# Patient Record
Sex: Female | Born: 1955 | Race: Black or African American | Hispanic: No | Smoking: Never smoker
Health system: Southern US, Community
[De-identification: ages and names within clinical notes are randomized; demographics above are authoritative.]

## PROBLEM LIST (undated history)

## (undated) DIAGNOSIS — K649 Unspecified hemorrhoids: Secondary | ICD-10-CM

## (undated) DIAGNOSIS — H269 Unspecified cataract: Secondary | ICD-10-CM

## (undated) DIAGNOSIS — I10 Essential (primary) hypertension: Secondary | ICD-10-CM

## (undated) DIAGNOSIS — T7840XA Allergy, unspecified, initial encounter: Secondary | ICD-10-CM

## (undated) DIAGNOSIS — H409 Unspecified glaucoma: Secondary | ICD-10-CM

## (undated) DIAGNOSIS — S5290XA Unspecified fracture of unspecified forearm, initial encounter for closed fracture: Secondary | ICD-10-CM

## (undated) DIAGNOSIS — R011 Cardiac murmur, unspecified: Secondary | ICD-10-CM

## (undated) HISTORY — DX: Unspecified glaucoma: H40.9

## (undated) HISTORY — PX: ENDOMETRIAL ABLATION: SHX621

## (undated) HISTORY — DX: Allergy, unspecified, initial encounter: T78.40XA

## (undated) HISTORY — PX: PARTIAL HYSTERECTOMY: SHX80

## (undated) HISTORY — DX: Unspecified cataract: H26.9

---

## 1997-04-15 ENCOUNTER — Ambulatory Visit (HOSPITAL_COMMUNITY): Admission: RE | Admit: 1997-04-15 | Discharge: 1997-04-15 | Payer: Self-pay | Admitting: Obstetrics and Gynecology

## 1997-12-18 ENCOUNTER — Other Ambulatory Visit: Admission: RE | Admit: 1997-12-18 | Discharge: 1997-12-18 | Payer: Self-pay | Admitting: Obstetrics and Gynecology

## 1998-10-20 ENCOUNTER — Other Ambulatory Visit: Admission: RE | Admit: 1998-10-20 | Discharge: 1998-10-20 | Payer: Self-pay | Admitting: Obstetrics and Gynecology

## 1999-01-27 ENCOUNTER — Ambulatory Visit (HOSPITAL_COMMUNITY): Admission: RE | Admit: 1999-01-27 | Discharge: 1999-01-27 | Payer: Self-pay | Admitting: Obstetrics and Gynecology

## 1999-01-27 ENCOUNTER — Encounter: Payer: Self-pay | Admitting: Obstetrics and Gynecology

## 1999-12-18 ENCOUNTER — Other Ambulatory Visit: Admission: RE | Admit: 1999-12-18 | Discharge: 1999-12-18 | Payer: Self-pay | Admitting: Obstetrics and Gynecology

## 2000-01-29 ENCOUNTER — Ambulatory Visit (HOSPITAL_COMMUNITY): Admission: RE | Admit: 2000-01-29 | Discharge: 2000-01-29 | Payer: Self-pay | Admitting: Obstetrics and Gynecology

## 2000-01-29 ENCOUNTER — Encounter: Payer: Self-pay | Admitting: Obstetrics and Gynecology

## 2000-05-11 ENCOUNTER — Encounter: Payer: Self-pay | Admitting: Anesthesiology

## 2000-05-16 ENCOUNTER — Ambulatory Visit (HOSPITAL_COMMUNITY): Admission: RE | Admit: 2000-05-16 | Discharge: 2000-05-16 | Payer: Self-pay | Admitting: Obstetrics and Gynecology

## 2000-06-02 ENCOUNTER — Encounter (INDEPENDENT_AMBULATORY_CARE_PROVIDER_SITE_OTHER): Payer: Self-pay | Admitting: Specialist

## 2000-06-02 HISTORY — PX: ABDOMINAL HYSTERECTOMY: SHX81

## 2000-06-03 ENCOUNTER — Inpatient Hospital Stay (HOSPITAL_COMMUNITY): Admission: RE | Admit: 2000-06-03 | Discharge: 2000-06-05 | Payer: Self-pay | Admitting: Obstetrics and Gynecology

## 2001-11-10 ENCOUNTER — Encounter: Payer: Self-pay | Admitting: Family Medicine

## 2001-11-10 ENCOUNTER — Ambulatory Visit (HOSPITAL_COMMUNITY): Admission: RE | Admit: 2001-11-10 | Discharge: 2001-11-10 | Payer: Self-pay | Admitting: Family Medicine

## 2002-06-22 ENCOUNTER — Encounter: Payer: Self-pay | Admitting: Family Medicine

## 2002-06-22 ENCOUNTER — Encounter: Admission: RE | Admit: 2002-06-22 | Discharge: 2002-06-22 | Payer: Self-pay | Admitting: Family Medicine

## 2002-12-31 ENCOUNTER — Ambulatory Visit (HOSPITAL_COMMUNITY): Admission: RE | Admit: 2002-12-31 | Discharge: 2002-12-31 | Payer: Self-pay | Admitting: Family Medicine

## 2004-01-16 ENCOUNTER — Ambulatory Visit (HOSPITAL_COMMUNITY): Admission: RE | Admit: 2004-01-16 | Discharge: 2004-01-16 | Payer: Self-pay | Admitting: Family Medicine

## 2004-02-03 ENCOUNTER — Ambulatory Visit: Payer: Self-pay | Admitting: Family Medicine

## 2004-02-03 ENCOUNTER — Other Ambulatory Visit: Admission: RE | Admit: 2004-02-03 | Discharge: 2004-02-03 | Payer: Self-pay | Admitting: Family Medicine

## 2004-02-03 LAB — CONVERTED CEMR LAB: Pap Smear: NORMAL

## 2004-02-18 LAB — FECAL OCCULT BLOOD, GUAIAC: Fecal Occult Blood: NEGATIVE

## 2004-02-20 ENCOUNTER — Ambulatory Visit: Payer: Self-pay | Admitting: Family Medicine

## 2004-03-02 ENCOUNTER — Ambulatory Visit: Payer: Self-pay | Admitting: Family Medicine

## 2004-08-04 ENCOUNTER — Ambulatory Visit: Payer: Self-pay | Admitting: Family Medicine

## 2004-09-22 ENCOUNTER — Ambulatory Visit: Payer: Self-pay | Admitting: Family Medicine

## 2005-01-26 ENCOUNTER — Ambulatory Visit (HOSPITAL_COMMUNITY): Admission: RE | Admit: 2005-01-26 | Discharge: 2005-01-26 | Payer: Self-pay | Admitting: Family Medicine

## 2005-03-26 ENCOUNTER — Ambulatory Visit: Payer: Self-pay | Admitting: Family Medicine

## 2005-11-04 ENCOUNTER — Ambulatory Visit: Payer: Self-pay | Admitting: Family Medicine

## 2005-12-23 ENCOUNTER — Ambulatory Visit: Payer: Self-pay | Admitting: Family Medicine

## 2006-01-03 ENCOUNTER — Ambulatory Visit: Payer: Self-pay | Admitting: Family Medicine

## 2006-01-05 ENCOUNTER — Ambulatory Visit: Payer: Self-pay | Admitting: Gastroenterology

## 2006-01-27 ENCOUNTER — Ambulatory Visit (HOSPITAL_COMMUNITY): Admission: RE | Admit: 2006-01-27 | Discharge: 2006-01-27 | Payer: Self-pay | Admitting: Family Medicine

## 2006-02-25 ENCOUNTER — Ambulatory Visit: Payer: Self-pay | Admitting: Gastroenterology

## 2006-03-05 HISTORY — PX: COLONOSCOPY: SHX174

## 2006-03-05 LAB — HM COLONOSCOPY: HM Colonoscopy: NORMAL

## 2006-03-11 ENCOUNTER — Ambulatory Visit: Payer: Self-pay | Admitting: Gastroenterology

## 2006-04-06 ENCOUNTER — Ambulatory Visit: Payer: Self-pay | Admitting: Family Medicine

## 2006-05-16 ENCOUNTER — Telehealth (INDEPENDENT_AMBULATORY_CARE_PROVIDER_SITE_OTHER): Payer: Self-pay | Admitting: Internal Medicine

## 2006-05-16 ENCOUNTER — Ambulatory Visit: Payer: Self-pay | Admitting: Family Medicine

## 2006-07-06 ENCOUNTER — Encounter: Payer: Self-pay | Admitting: Family Medicine

## 2006-07-06 DIAGNOSIS — B009 Herpesviral infection, unspecified: Secondary | ICD-10-CM | POA: Insufficient documentation

## 2006-07-06 DIAGNOSIS — N3946 Mixed incontinence: Secondary | ICD-10-CM | POA: Insufficient documentation

## 2006-07-06 DIAGNOSIS — I10 Essential (primary) hypertension: Secondary | ICD-10-CM | POA: Insufficient documentation

## 2006-07-19 ENCOUNTER — Ambulatory Visit: Payer: Self-pay | Admitting: Family Medicine

## 2006-07-19 DIAGNOSIS — M21619 Bunion of unspecified foot: Secondary | ICD-10-CM | POA: Insufficient documentation

## 2006-07-21 ENCOUNTER — Encounter: Payer: Self-pay | Admitting: Family Medicine

## 2006-08-18 ENCOUNTER — Ambulatory Visit: Payer: Self-pay | Admitting: Family Medicine

## 2006-08-18 DIAGNOSIS — J342 Deviated nasal septum: Secondary | ICD-10-CM | POA: Insufficient documentation

## 2006-09-08 ENCOUNTER — Telehealth (INDEPENDENT_AMBULATORY_CARE_PROVIDER_SITE_OTHER): Payer: Self-pay | Admitting: *Deleted

## 2006-10-03 ENCOUNTER — Telehealth: Payer: Self-pay | Admitting: Family Medicine

## 2006-11-15 ENCOUNTER — Ambulatory Visit: Payer: Self-pay | Admitting: Family Medicine

## 2006-11-15 DIAGNOSIS — R7303 Prediabetes: Secondary | ICD-10-CM | POA: Insufficient documentation

## 2006-12-15 ENCOUNTER — Ambulatory Visit: Payer: Self-pay | Admitting: Family Medicine

## 2006-12-16 LAB — CONVERTED CEMR LAB
ALT: 11 units/L (ref 0–35)
AST: 18 units/L (ref 0–37)
Albumin: 3.8 g/dL (ref 3.5–5.2)
BUN: 11 mg/dL (ref 6–23)
CO2: 29 meq/L (ref 19–32)
Calcium: 8.9 mg/dL (ref 8.4–10.5)
Chloride: 101 meq/L (ref 96–112)
Cholesterol: 189 mg/dL (ref 0–200)
Creatinine, Ser: 0.8 mg/dL (ref 0.4–1.2)
GFR calc Af Amer: 96 mL/min
GFR calc non Af Amer: 80 mL/min
Glucose, Bld: 101 mg/dL — ABNORMAL HIGH (ref 70–99)
HDL: 60.4 mg/dL (ref >39.0)
Hgb A1c MFr Bld: 6.1 % — ABNORMAL HIGH (ref 4.6–6.0)
LDL Cholesterol: 117 mg/dL — ABNORMAL HIGH (ref 0–99)
Phosphorus: 3.9 mg/dL (ref 2.3–4.6)
Potassium: 3.7 meq/L (ref 3.5–5.1)
Sodium: 137 meq/L (ref 135–145)
Total CHOL/HDL Ratio: 3.1
Triglycerides: 60 mg/dL (ref 0–149)
VLDL: 12 mg/dL (ref 0–40)

## 2007-01-20 ENCOUNTER — Ambulatory Visit: Payer: Self-pay | Admitting: Family Medicine

## 2007-02-07 ENCOUNTER — Ambulatory Visit: Payer: Self-pay | Admitting: Family Medicine

## 2007-03-02 ENCOUNTER — Encounter: Admission: RE | Admit: 2007-03-02 | Discharge: 2007-03-02 | Payer: Self-pay | Admitting: Family Medicine

## 2007-03-08 ENCOUNTER — Encounter (INDEPENDENT_AMBULATORY_CARE_PROVIDER_SITE_OTHER): Payer: Self-pay | Admitting: *Deleted

## 2007-04-21 ENCOUNTER — Ambulatory Visit: Payer: Self-pay | Admitting: Family Medicine

## 2007-04-25 LAB — CONVERTED CEMR LAB
Albumin: 3.9 g/dL (ref 3.5–5.2)
BUN: 16 mg/dL (ref 6–23)
CO2: 32 meq/L (ref 19–32)
Calcium: 9.4 mg/dL (ref 8.4–10.5)
Chloride: 100 meq/L (ref 96–112)
Creatinine, Ser: 0.9 mg/dL (ref 0.4–1.2)
GFR calc Af Amer: 84 mL/min
GFR calc non Af Amer: 70 mL/min
Glucose, Bld: 96 mg/dL (ref 70–99)
Hgb A1c MFr Bld: 6.3 % — ABNORMAL HIGH (ref 4.6–6.0)
Phosphorus: 4.2 mg/dL (ref 2.3–4.6)
Potassium: 3.4 meq/L — ABNORMAL LOW (ref 3.5–5.1)
Sodium: 137 meq/L (ref 135–145)

## 2007-04-28 ENCOUNTER — Ambulatory Visit: Payer: Self-pay | Admitting: Family Medicine

## 2007-04-28 DIAGNOSIS — E876 Hypokalemia: Secondary | ICD-10-CM | POA: Insufficient documentation

## 2007-05-12 ENCOUNTER — Ambulatory Visit: Payer: Self-pay | Admitting: Family Medicine

## 2007-05-15 LAB — CONVERTED CEMR LAB
Albumin: 3.9 g/dL (ref 3.5–5.2)
BUN: 14 mg/dL (ref 6–23)
CO2: 31 meq/L (ref 19–32)
Calcium: 9.3 mg/dL (ref 8.4–10.5)
Chloride: 101 meq/L (ref 96–112)
Creatinine, Ser: 0.8 mg/dL (ref 0.4–1.2)
GFR calc Af Amer: 96 mL/min
GFR calc non Af Amer: 79 mL/min
Glucose, Bld: 100 mg/dL — ABNORMAL HIGH (ref 70–99)
Phosphorus: 4.1 mg/dL (ref 2.3–4.6)
Potassium: 3.9 meq/L (ref 3.5–5.1)
Sodium: 138 meq/L (ref 135–145)

## 2007-06-19 ENCOUNTER — Telehealth: Payer: Self-pay | Admitting: Family Medicine

## 2007-06-19 ENCOUNTER — Ambulatory Visit: Payer: Self-pay | Admitting: Family Medicine

## 2007-06-19 DIAGNOSIS — R4589 Other symptoms and signs involving emotional state: Secondary | ICD-10-CM | POA: Insufficient documentation

## 2007-06-21 LAB — CONVERTED CEMR LAB
Albumin: 4 g/dL (ref 3.5–5.2)
BUN: 15 mg/dL (ref 6–23)
Basophils Absolute: 0.1 10*3/uL (ref 0.0–0.1)
Basophils Relative: 1.3 % — ABNORMAL HIGH (ref 0.0–1.0)
CO2: 30 meq/L (ref 19–32)
Calcium: 9.2 mg/dL (ref 8.4–10.5)
Chloride: 102 meq/L (ref 96–112)
Creatinine, Ser: 0.9 mg/dL (ref 0.4–1.2)
Eosinophils Absolute: 0.1 10*3/uL (ref 0.0–0.7)
Eosinophils Relative: 2.3 % (ref 0.0–5.0)
GFR calc Af Amer: 84 mL/min
GFR calc non Af Amer: 69 mL/min
Glucose, Bld: 105 mg/dL — ABNORMAL HIGH (ref 70–99)
HCT: 36.5 % (ref 36.0–46.0)
Hemoglobin: 12.2 g/dL (ref 12.0–15.0)
Lymphocytes Relative: 38.6 % (ref 12.0–46.0)
MCHC: 33.5 g/dL (ref 30.0–36.0)
MCV: 97.9 fL (ref 78.0–100.0)
Monocytes Absolute: 0.4 10*3/uL (ref 0.1–1.0)
Monocytes Relative: 8.5 % (ref 3.0–12.0)
Neutro Abs: 2.2 10*3/uL (ref 1.4–7.7)
Neutrophils Relative %: 49.3 % (ref 43.0–77.0)
Phosphorus: 4.3 mg/dL (ref 2.3–4.6)
Platelets: 310 10*3/uL (ref 150–400)
Potassium: 4.1 meq/L (ref 3.5–5.1)
RBC: 3.73 M/uL — ABNORMAL LOW (ref 3.87–5.11)
RDW: 11.8 % (ref 11.5–14.6)
Sed Rate: 11 mm/hr (ref 0–22)
Sodium: 139 meq/L (ref 135–145)
TSH: 1.82 microintl units/mL (ref 0.35–5.50)
WBC: 4.6 10*3/uL (ref 4.5–10.5)

## 2007-07-20 ENCOUNTER — Ambulatory Visit: Payer: Self-pay | Admitting: Family Medicine

## 2007-11-28 ENCOUNTER — Telehealth (INDEPENDENT_AMBULATORY_CARE_PROVIDER_SITE_OTHER): Payer: Self-pay | Admitting: *Deleted

## 2008-02-02 ENCOUNTER — Ambulatory Visit: Payer: Self-pay | Admitting: Family Medicine

## 2008-03-04 ENCOUNTER — Ambulatory Visit: Payer: Self-pay | Admitting: Family Medicine

## 2008-03-05 ENCOUNTER — Encounter (INDEPENDENT_AMBULATORY_CARE_PROVIDER_SITE_OTHER): Payer: Self-pay | Admitting: *Deleted

## 2008-03-05 LAB — CONVERTED CEMR LAB
ALT: 26 units/L (ref 0–35)
AST: 24 units/L (ref 0–37)
Albumin: 4.2 g/dL (ref 3.5–5.2)
Alkaline Phosphatase: 51 units/L (ref 39–117)
BUN: 16 mg/dL (ref 6–23)
Basophils Absolute: 0 10*3/uL (ref 0.0–0.1)
Basophils Relative: 0.1 % (ref 0.0–3.0)
Bilirubin, Direct: 0.1 mg/dL (ref 0.0–0.3)
CO2: 31 meq/L (ref 19–32)
Calcium: 9.5 mg/dL (ref 8.4–10.5)
Chloride: 102 meq/L (ref 96–112)
Cholesterol: 200 mg/dL (ref 0–200)
Creatinine, Ser: 0.8 mg/dL (ref 0.4–1.2)
Eosinophils Absolute: 0 10*3/uL (ref 0.0–0.7)
Eosinophils Relative: 1.2 % (ref 0.0–5.0)
GFR calc Af Amer: 96 mL/min
GFR calc non Af Amer: 79 mL/min
Glucose, Bld: 110 mg/dL — ABNORMAL HIGH (ref 70–99)
HCT: 38.7 % (ref 36.0–46.0)
HDL: 70 mg/dL (ref >39.0)
Hemoglobin: 13.5 g/dL (ref 12.0–15.0)
LDL Cholesterol: 124 mg/dL — ABNORMAL HIGH (ref 0–99)
Lymphocytes Relative: 41.2 % (ref 12.0–46.0)
MCHC: 35 g/dL (ref 30.0–36.0)
MCV: 93.4 fL (ref 78.0–100.0)
Monocytes Absolute: 0.3 10*3/uL (ref 0.1–1.0)
Monocytes Relative: 7.9 % (ref 3.0–12.0)
Neutro Abs: 1.9 10*3/uL (ref 1.4–7.7)
Neutrophils Relative %: 49.6 % (ref 43.0–77.0)
Phosphorus: 3.9 mg/dL (ref 2.3–4.6)
Platelets: 264 10*3/uL (ref 150–400)
Potassium: 3.4 meq/L — ABNORMAL LOW (ref 3.5–5.1)
RBC: 4.14 M/uL (ref 3.87–5.11)
RDW: 12 % (ref 11.5–14.6)
Sodium: 140 meq/L (ref 135–145)
Total Bilirubin: 0.8 mg/dL (ref 0.3–1.2)
Total CHOL/HDL Ratio: 2.9
Total Protein: 7.3 g/dL (ref 6.0–8.3)
Triglycerides: 30 mg/dL (ref 0–149)
VLDL: 6 mg/dL (ref 0–40)
WBC: 3.7 10*3/uL — ABNORMAL LOW (ref 4.5–10.5)

## 2008-03-06 ENCOUNTER — Ambulatory Visit: Payer: Self-pay | Admitting: Family Medicine

## 2008-03-06 ENCOUNTER — Telehealth: Payer: Self-pay | Admitting: Family Medicine

## 2008-03-06 LAB — CONVERTED CEMR LAB
Bacteria, UA: 0
Bilirubin Urine: NEGATIVE
Blood in Urine, dipstick: NEGATIVE
Glucose, Urine, Semiquant: NEGATIVE
Ketones, urine, test strip: NEGATIVE
Nitrite: NEGATIVE
RBC / HPF: 0
Specific Gravity, Urine: 1.02
Urobilinogen, UA: 0.2
WBC Urine, dipstick: NEGATIVE
pH: 6.5

## 2008-03-07 ENCOUNTER — Encounter (INDEPENDENT_AMBULATORY_CARE_PROVIDER_SITE_OTHER): Payer: Self-pay | Admitting: Internal Medicine

## 2008-03-18 ENCOUNTER — Ambulatory Visit: Payer: Self-pay | Admitting: Family Medicine

## 2008-03-18 DIAGNOSIS — D72829 Elevated white blood cell count, unspecified: Secondary | ICD-10-CM | POA: Insufficient documentation

## 2008-03-20 LAB — CONVERTED CEMR LAB
Basophils Absolute: 0 10*3/uL (ref 0.0–0.1)
Basophils Relative: 0 % (ref 0–1)
Eosinophils Absolute: 0.1 10*3/uL (ref 0.0–0.7)
Eosinophils Relative: 1 % (ref 0–5)
HCT: 40.4 % (ref 36.0–46.0)
Hemoglobin: 13.8 g/dL (ref 12.0–15.0)
Lymphocytes Relative: 43 % (ref 12–46)
Lymphs Abs: 2 10*3/uL (ref 0.7–4.0)
MCHC: 34.2 g/dL (ref 30.0–36.0)
MCV: 90.2 fL (ref 78.0–100.0)
Monocytes Absolute: 0.5 10*3/uL (ref 0.1–1.0)
Monocytes Relative: 10 % (ref 3–12)
Neutro Abs: 2.1 10*3/uL (ref 1.7–7.7)
Neutrophils Relative %: 46 % (ref 43–77)
Platelets: 291 10*3/uL (ref 150–400)
Potassium: 3.9 meq/L (ref 3.5–5.3)
RBC: 4.48 M/uL (ref 3.87–5.11)
RDW: 12.2 % (ref 11.5–15.5)
WBC: 4.6 10*3/uL (ref 4.0–10.5)

## 2008-04-02 ENCOUNTER — Ambulatory Visit (HOSPITAL_COMMUNITY): Admission: RE | Admit: 2008-04-02 | Discharge: 2008-04-02 | Payer: Self-pay | Admitting: Family Medicine

## 2008-04-04 ENCOUNTER — Encounter (INDEPENDENT_AMBULATORY_CARE_PROVIDER_SITE_OTHER): Payer: Self-pay | Admitting: *Deleted

## 2008-06-19 ENCOUNTER — Ambulatory Visit: Payer: Self-pay | Admitting: Family Medicine

## 2008-11-05 ENCOUNTER — Encounter: Payer: Self-pay | Admitting: Family Medicine

## 2008-12-30 ENCOUNTER — Ambulatory Visit: Payer: Self-pay | Admitting: Otolaryngology

## 2009-01-07 ENCOUNTER — Ambulatory Visit: Payer: Self-pay | Admitting: Family Medicine

## 2009-01-21 ENCOUNTER — Ambulatory Visit: Payer: Self-pay | Admitting: Family Medicine

## 2009-01-23 ENCOUNTER — Telehealth: Payer: Self-pay | Admitting: Family Medicine

## 2009-01-30 ENCOUNTER — Ambulatory Visit: Payer: Self-pay | Admitting: Family Medicine

## 2009-02-05 ENCOUNTER — Telehealth: Payer: Self-pay | Admitting: Family Medicine

## 2009-02-07 ENCOUNTER — Ambulatory Visit: Payer: Self-pay | Admitting: Family Medicine

## 2009-02-10 LAB — CONVERTED CEMR LAB
Albumin: 4 g/dL (ref 3.5–5.2)
BUN: 13 mg/dL (ref 6–23)
CO2: 31 meq/L (ref 19–32)
Calcium: 9.2 mg/dL (ref 8.4–10.5)
Chloride: 103 meq/L (ref 96–112)
Creatinine, Ser: 0.8 mg/dL (ref 0.4–1.2)
GFR calc non Af Amer: 95.5 mL/min (ref >60)
Glucose, Bld: 74 mg/dL (ref 70–99)
Phosphorus: 3.2 mg/dL (ref 2.3–4.6)
Potassium: 3.8 meq/L (ref 3.5–5.1)
Sodium: 140 meq/L (ref 135–145)

## 2009-03-07 ENCOUNTER — Ambulatory Visit: Payer: Self-pay | Admitting: Family Medicine

## 2009-03-10 ENCOUNTER — Ambulatory Visit: Payer: Self-pay | Admitting: Family Medicine

## 2009-03-10 ENCOUNTER — Telehealth: Payer: Self-pay | Admitting: Family Medicine

## 2009-03-10 LAB — CONVERTED CEMR LAB
Bilirubin Urine: NEGATIVE
Glucose, Urine, Semiquant: NEGATIVE
Ketones, urine, test strip: NEGATIVE
Nitrite: NEGATIVE
Protein, U semiquant: 30
Specific Gravity, Urine: 1.01
Urobilinogen, UA: 0.2
pH: 6

## 2009-04-03 ENCOUNTER — Ambulatory Visit (HOSPITAL_COMMUNITY): Admission: RE | Admit: 2009-04-03 | Discharge: 2009-04-03 | Payer: Self-pay | Admitting: Family Medicine

## 2009-04-03 LAB — HM MAMMOGRAPHY: HM Mammogram: NEGATIVE

## 2009-04-08 ENCOUNTER — Encounter (INDEPENDENT_AMBULATORY_CARE_PROVIDER_SITE_OTHER): Payer: Self-pay | Admitting: *Deleted

## 2009-08-11 ENCOUNTER — Ambulatory Visit: Payer: Self-pay | Admitting: Family Medicine

## 2009-08-12 LAB — CONVERTED CEMR LAB
ALT: 27 units/L (ref 0–35)
AST: 24 units/L (ref 0–37)
Albumin: 4.2 g/dL (ref 3.5–5.2)
Alkaline Phosphatase: 49 units/L (ref 39–117)
BUN: 18 mg/dL (ref 6–23)
Basophils Absolute: 0 10*3/uL (ref 0.0–0.1)
Basophils Relative: 0.5 % (ref 0.0–3.0)
Bilirubin, Direct: 0.1 mg/dL (ref 0.0–0.3)
CO2: 32 meq/L (ref 19–32)
Calcium: 9.3 mg/dL (ref 8.4–10.5)
Chloride: 101 meq/L (ref 96–112)
Cholesterol: 203 mg/dL — ABNORMAL HIGH (ref 0–200)
Creatinine, Ser: 0.7 mg/dL (ref 0.4–1.2)
Direct LDL: 107.3 mg/dL
Eosinophils Absolute: 0.1 10*3/uL (ref 0.0–0.7)
Eosinophils Relative: 2.1 % (ref 0.0–5.0)
GFR calc non Af Amer: 104.3 mL/min (ref >60)
Glucose, Bld: 94 mg/dL (ref 70–99)
HCT: 37.3 % (ref 36.0–46.0)
HDL: 78.3 mg/dL (ref >39.00)
Hemoglobin: 12.7 g/dL (ref 12.0–15.0)
Lymphocytes Relative: 34.7 % (ref 12.0–46.0)
Lymphs Abs: 1.4 10*3/uL (ref 0.7–4.0)
MCHC: 34.1 g/dL (ref 30.0–36.0)
MCV: 94.6 fL (ref 78.0–100.0)
Monocytes Absolute: 0.4 10*3/uL (ref 0.1–1.0)
Monocytes Relative: 10.6 % (ref 3.0–12.0)
Neutro Abs: 2.1 10*3/uL (ref 1.4–7.7)
Neutrophils Relative %: 52.1 % (ref 43.0–77.0)
Platelets: 259 10*3/uL (ref 150.0–400.0)
Potassium: 3.9 meq/L (ref 3.5–5.1)
RBC: 3.94 M/uL (ref 3.87–5.11)
RDW: 13.5 % (ref 11.5–14.6)
Sodium: 142 meq/L (ref 135–145)
TSH: 1.23 microintl units/mL (ref 0.35–5.50)
Total Bilirubin: 0.5 mg/dL (ref 0.3–1.2)
Total CHOL/HDL Ratio: 3
Total Protein: 6.9 g/dL (ref 6.0–8.3)
Triglycerides: 100 mg/dL (ref 0.0–149.0)
VLDL: 20 mg/dL (ref 0.0–40.0)
WBC: 4.1 10*3/uL — ABNORMAL LOW (ref 4.5–10.5)

## 2009-08-13 ENCOUNTER — Ambulatory Visit: Payer: Self-pay | Admitting: Family Medicine

## 2009-08-13 DIAGNOSIS — J309 Allergic rhinitis, unspecified: Secondary | ICD-10-CM | POA: Insufficient documentation

## 2009-11-04 ENCOUNTER — Ambulatory Visit: Payer: Self-pay | Admitting: Family Medicine

## 2009-11-04 DIAGNOSIS — K644 Residual hemorrhoidal skin tags: Secondary | ICD-10-CM | POA: Insufficient documentation

## 2010-01-25 ENCOUNTER — Encounter: Payer: Self-pay | Admitting: Family Medicine

## 2010-02-03 NOTE — Progress Notes (Signed)
Summary: BP med is working  Phone Note Call from Patient Call back at [phone]   Caller: Patient Call For: Judith Part MD Action Taken: Patient advised to go to ER Summary of Call: Patient was scheduled for an app tomorrow to check her BP. She called and cancelled it, she said that she had her school nurse check it on monday and it was 120/80 and the dentist checked it this morning and it was 127/72. She wanted you to know that the med seems to be working.  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 Additional Follow-up by: Lowella Petties CMA,  February 05, 2009 9:52 AM

## 2010-02-03 NOTE — Letter (Signed)
Summary: Results Follow up Letter  [facility]  [address] [place], [place] [address]   Phone: [phone]  Fax: [phone]    04/08/2009 MRN: [id number]    [facility] [address] [place], [place]  [address]    Dear Ms. LEVANDOSKI,  The following are the results of your recent test(s):  Test         Result    Pap Smear:        Normal _____  Not Normal _____ Comments: ______________________________________________________ Cholesterol: LDL(Bad cholesterol):         Your goal is less than:         HDL (Good cholesterol):       Your goal is more than: Comments:  ______________________________________________________ Mammogram:        Normal __X___  Not Normal _____ Comments:  Yearly follow up is recommended.   ___________________________________________________________________ Hemoccult:        Normal _____  Not normal _______ Comments:    _____________________________________________________________________ Other Tests:    We routinely do not discuss normal results over the telephone.  If you desire a copy of the results, or you have any questions about this information we can discuss them at your next office visit.   Sincerely,    Marne A. Milinda Antis, M.D.  MAT:lsf

## 2010-02-03 NOTE — Assessment & Plan Note (Signed)
Summary: 11:30 CHECK BP/CLE   Vital Signs:  Patient profile:   55 year old female Weight:      205 pounds Temp:     97.8 degrees F oral Pulse rate:   60 / minute Pulse rhythm:   regular BP sitting:   152 / 92  (left arm) Cuff size:   regular  Vitals Entered By: Lowella Petties CMA (January 30, 2009 11:48 AM)  Serial Vital Signs/Assessments:  Time      Position  BP       Pulse  Resp  Temp     By                     148/92                         Judith Part MD  CC: BP follow up- spironolactone is causing diarrhea   History of Present Illness: here for re check of bp - on increased altace and new spironolactone   did not take the spironolactone due to diarrhea (also did not make her urinate)   HCTz- went back to that -- a whole one today -- is urinating a lot more did take altace today   no nausea or fever or abd pain   pressure yesterday- still 160/90 , this am still 160s   in past lotrel helped her bp most- but she stopped it due to hair loss in retrospect - she does not know if that actually caused it  she wants to try it again   Allergies: 1)  ! * Benicar 2)  ! Spironolactone 3)  Norvasc 4)  * Uniretic 5)  * Hctz 6)  Diovan  Past History:  Past Medical History: Last updated: 06/19/2007 Hypertension hyperglycemia deviated septum- symptomatic  bunion foot deformity stress reaction/anxiety HSV   Past Surgical History: Last updated: 07/06/2006 Hysterectomy- partial (06/2000) Endometrial ablation Colonosxopy (01/2000) Dexa- normal (12/2005) LS film- mod OA changes (12/2005), Deg changes LS-5 Colonoscopy- neg (03/2006)  Family History: Last updated: 01/07/2009 Father: HTN, CAD with CABG Mother: HTN Siblings:  aunts- heart problems   Social History: Last updated: 06/19/2007 Marital Status: single (divorced ) Children: 2 Occupation: Engineer, site, also going to school for teaching degree  non smoker no alcohol regular exercise   Risk  Factors: Smoking Status: never (05/16/2006)  Review of Systems General:  Denies fatigue. Eyes:  Denies blurring and eye irritation. CV:  Complains of swelling of feet; denies chest pain or discomfort, lightheadness, palpitations, shortness of breath with exertion, and swelling of hands. Resp:  Denies cough and wheezing. GI:  Denies abdominal pain, change in bowel habits, and nausea. GU:  Denies urinary frequency. MS:  Denies joint pain. Derm:  Denies lesion(s), poor wound healing, and rash. Neuro:  Denies numbness and tingling. Endo:  Denies cold intolerance, excessive thirst, excessive urination, and heat intolerance.  Physical Exam  General:  overweight but generally well appearing  Head:  normocephalic, atraumatic, and no abnormalities observed.   Eyes:  vision grossly intact, pupils equal, pupils round, and pupils reactive to light.  no conjunctival pallor, injection or icterus  Mouth:  pharynx pink and moist.   Neck:  supple with full rom and no masses or thyromegally, no JVD or carotid bruit  Lungs:  Normal respiratory effort, chest expands symmetrically. Lungs are clear to auscultation, no crackles or wheezes. Heart:  Normal rate and regular rhythm. S1 and S2 normal without  gallop, murmur, click, rub or other extra sounds. Pulses:  R and L carotid,radial,femoral,dorsalis pedis and posterior tibial pulses are full and equal bilaterally Extremities:  No clubbing, cyanosis, edema, or deformity noted with normal full range of motion of all joints.   Neurologic:  sensation intact to light touch, gait normal, and DTRs symmetrical and normal.   Skin:  Intact without suspicious lesions or rashes Cervical Nodes:  No lymphadenopathy noted Psych:  normal affect, talkative and pleasant    Impression & Recommendations:  Problem # 1:  HYPERTENSION (ICD-401.9) Assessment Unchanged  still not opt controlled  pt intol of spironolactone and not eff she wants to re - try lotrel that  worked the best - ? if caused hair loss, she is uncertain will get back on that and add hctz at 50 f/u 1 wk for lab and visit update in meantime for problems or side eff  rev imp of eating low sodium diet and exercising The following medications were removed from the medication list:    Altace 10 Mg Caps (Ramipril) .Marland Kitchen... 2 by mouth once daily    Spironolactone 25 Mg Tabs (Spironolactone) .Marland Kitchen... Take one by mouth daily Her updated medication list for this problem includes:    Lotrel 5-20 Mg Caps (Amlodipine besy-benazepril hcl) .Marland Kitchen... 1 by mouth each am    Hydrochlorothiazide 50 Mg Tabs (Hydrochlorothiazide) .Marland Kitchen... 1 by mouth once daily  BP today: 152/92 Prior BP: 150/84 (01/21/2009)  Labs Reviewed: K+: 3.9 (03/18/2008) Creat: : 0.8 (03/04/2008)   Chol: 200 (03/04/2008)   HDL: 70.0 (03/04/2008)   LDL: 124 (03/04/2008)   TG: 30 (03/04/2008)  Complete Medication List: 1)  Fish Oil 600 Mg  .... Take two by mouth daily 2)  Multiple Vitamins Tabs (Multiple vitamin) .... Take by mouth as directed 3)  Potassium Chloride Cr 10 Meq Cr-tabs (Potassium chloride) .Marland Kitchen.. 1 by mouth once daily 4)  Lotrel 5-20 Mg Caps (Amlodipine besy-benazepril hcl) .Marland Kitchen.. 1 by mouth each am 5)  Hydrochlorothiazide 50 Mg Tabs (Hydrochlorothiazide) .Marland Kitchen.. 1 by mouth once daily  Other Orders: Prescription Created Electronically [phone])  Patient Instructions: 1)  continue 1 whole hctz daily 2)  stop altace 3)  start on lotrel 5/20 again (if side effcts or hair loss let me know )  4)  follow up as planned 5)  keep working on healthy diet and exercise-- avoid salt as much as possible  Prescriptions: LOTREL 5-20 MG CAPS (AMLODIPINE BESY-BENAZEPRIL HCL) 1 by mouth each am  #30 x 5   Entered and Authorized by:   Judith Part MD   Signed by:   Judith Part MD on 01/30/2009   Method used:   Electronically to        [facility]. [address] [phone]* (retail)       [address] [place], [place]  [id number]        Ph: [phone]       Fax: [phone]   RxID:   [id number]

## 2010-02-03 NOTE — Assessment & Plan Note (Signed)
Summary: Rebecca [name] bp check/rbh  Nurse Visit   Vital Signs:  Patient profile:   55 year old female Weight:      203 pounds Pulse rate:   60 / minute BP sitting:   150 / 84  (right arm) Cuff size:   large  Vitals Entered By: Lowella Petties CMA (January 21, 2009 4:19 PM) CC: Nurse visit- BP check.   Allergies: 1)  ! * Benicar 2)  Norvasc 3)  * Uniretic 4)  * Hctz 5)  Diovan 6)  * Lotrel  Orders Added: 1)  Est. Patient Level I [84[address]]   please adv pt that blood pressure is down a bit but still too high I want to add another bp med (would like to replace her hct with a different diuretic she has not tried before called spironolactone)  please stop hctz start spironolactone 25 mg once daily #30 5 ref - and update if any side effects -- and please change on EMR f/u in about 2 weeks for visit and labs   Advised pt, med called to [facility] [address] follow up appt made.           Lowella Petties CMA  January 22, 2009 4:50 PM  Prior Medications: FISH OIL 600 MG () take two by mouth daily MULTIPLE VITAMINS   TABS (MULTIPLE VITAMIN) take by mouth as directed ALTACE 10 MG CAPS (RAMIPRIL) 2 by mouth once daily POTASSIUM CHLORIDE CR 10 MEQ CR-TABS (POTASSIUM CHLORIDE) 1 by mouth once daily Current Allergies: ! * BENICAR NORVASC * UNIRETIC * HCTZ DIOVAN * LOTREL

## 2010-02-03 NOTE — Assessment & Plan Note (Signed)
Summary: needs EKG before surgery   Vital Signs:  Patient profile:   55 year old female Weight:      203 pounds BMI:     31.91 Temp:     97.8 degrees F oral Pulse rate:   76 / minute Pulse rhythm:   regular BP sitting:   164 / 90  (left arm) Cuff size:   large  Vitals Entered By: Lowella Petties CMA (January 07, 2009 12:07 PM) CC: EKG- clearance for surgery   History of Present Illness: will be re- scheduling her --for nasal surgery for deviated septum and to clean out sinuses   had EKG with some pvcs at their office (sent me a copy but unreadable) had some PVC s on it  todays EKG - is normal without PVCs  did have coffee the day of her prev EKG   never had any heart problems- but it does run in family  did labs -- no results yet   no problems with general anesthesia in past  no lung problems or heart problems  no change in medicine   bp was up a bit today 164/90   she does occ note palpitations with caffiene   Allergies: 1)  ! * Benicar 2)  Norvasc 3)  * Uniretic 4)  * Hctz 5)  Diovan 6)  * Lotrel  Past History:  Past Medical History: Last updated: 06/19/2007 Hypertension hyperglycemia deviated septum- symptomatic  bunion foot deformity stress reaction/anxiety HSV   Past Surgical History: Last updated: 07/06/2006 Hysterectomy- partial (06/2000) Endometrial ablation Colonosxopy (01/2000) Dexa- normal (12/2005) LS film- mod OA changes (12/2005), Deg changes LS-5 Colonoscopy- neg (03/2006)  Family History: Last updated: 01/07/2009 Father: HTN, CAD with CABG Mother: HTN Siblings:  aunts- heart problems   Social History: Last updated: 06/19/2007 Marital Status: single (divorced ) Children: 2 Occupation: Engineer, site, 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 Eyes:  Denies blurring and double vision. CV:  Complains of palpitations; denies chest pain or discomfort, shortness of breath with exertion, and swelling of feet; occ palpitation. Resp:  Denies cough, shortness of breath, and wheezing. GI:  Denies abdominal pain, change in bowel habits, and nausea. MS:  Complains of joint pain; denies muscle weakness. Derm:  Denies itching, lesion(s), poor wound healing, and rash. Neuro:  Denies numbness and tingling. Heme:  Denies abnormal bruising and bleeding.  Physical Exam  General:  overweight but generally well appearing  Head:  normocephalic, atraumatic, and no abnormalities observed.   Eyes:  vision grossly intact, pupils equal, pupils round, and pupils reactive to light.  no conjunctival pallor, injection or icterus  Mouth:  pharynx pink and moist.   Neck:  supple with full rom and no masses or thyromegally, no JVD or carotid bruit  Chest Wall:  No deformities, masses, or tenderness noted. Lungs:  Normal respiratory effort, chest expands symmetrically. Lungs are clear to auscultation, no crackles or wheezes. Heart:  Normal rate and regular rhythm. S1 and S2 normal without gallop, murmur, click, rub or other extra sounds. Abdomen:  Bowel sounds positive,abdomen soft and non-tender without masses, organomegaly or hernias noted. no renal bruits  Msk:  No deformity or scoliosis noted of thoracic or lumbar spine.  Pulses:  R and L carotid,radial,femoral,dorsalis pedis and posterior tibial pulses are full and equal bilaterally Extremities:  No clubbing, cyanosis, edema, or deformity noted with normal full range of motion of all joints.   Neurologic:  sensation intact to light touch, gait normal, and DTRs symmetrical and normal.   Skin:  Intact without suspicious lesions or rashes Cervical Nodes:  No lymphadenopathy noted Psych:  normal affect, talkative  and pleasant    Impression & Recommendations:  Problem # 1:  HYPERTENSION (ICD-401.9) Assessment Deteriorated  bp is up-- will inc her altace to 2 pills (20 mg) -- update if side eff bp check in 2 weeks  labs will be due in spring urged to get back to exercise  Her updated medication list for this problem includes:    Hydrochlorothiazide 50 Mg Tabs (Hydrochlorothiazide) .Marland Kitchen... Take 1/2 by mouth once daily    Altace 10 Mg Caps (Ramipril) .Marland Kitchen... 2 by mouth once daily  BP today: 164/90-- re check 152/90 Prior BP: 120/80 (06/19/2008)  Labs Reviewed: K+: 3.9 (03/18/2008) Creat: : 0.8 (03/04/2008)   Chol: 200 (03/04/2008)   HDL: 70.0 (03/04/2008)   LDL: 124 (03/04/2008)   TG: 30 (03/04/2008)  Orders: EKG w/ Interpretation ([address])  Problem # 2:  PRE-OPERATIVE CARDIOVASCULAR EXAMINATION (ICD-V72.81)  normal EKG today with no acute changes (? if had pvc on last )  pt does have known palpitations with caffiene- asked her to quit that  no restrictions for surgery   Orders: EKG w/ Interpretation ([address])  Complete Medication List: 1)  Hydrochlorothiazide 50 Mg Tabs (Hydrochlorothiazide) .... Take 1/2 by mouth once daily 2)  Fish Oil 600 Mg  .... Take two by mouth daily 3)  Multiple Vitamins Tabs (Multiple vitamin) .... Take by mouth as directed 4)  Altace 10 Mg Caps (Ramipril) .... 2 by mouth once daily 5)  Potassium Chloride Cr 10 Meq Cr-tabs (Potassium chloride) .Marland Kitchen.. 1 by mouth once daily  Patient Instructions: 1)  avoid caffiene  2)  drink more water 3)  get back to regular exercise  4)  increase ramipril 10 mg to 2 pills daily - update me if any side effects or problems with this  5)  no restrictions for surgery  6)  follow up for nurse visit in about 2 weeks for blood pressure check on increased medicine dose  7)  schedule follow up for 30 min visit for gyn exam when able please  Prescriptions: ALTACE 10 MG CAPS (RAMIPRIL) 2 by mouth once daily  #60 x 5   Entered and  Authorized by:   Judith Part MD   Signed by:   Judith Part MD on 01/07/2009   Method used:   Print then Give to Patient   RxID:   [id number]   Prior Medications (reviewed today): HYDROCHLOROTHIAZIDE 50 MG  TABS (HYDROCHLOROTHIAZIDE) take 1/2 by mouth once daily FISH OIL 600 MG () take two by mouth daily MULTIPLE VITAMINS   TABS (MULTIPLE VITAMIN) take by mouth as directed ALTACE 10 MG CAPS (RAMIPRIL) 2 by mouth once daily POTASSIUM CHLORIDE CR 10 MEQ CR-TABS (POTASSIUM CHLORIDE) 1 by mouth once daily Current Allergies: ! * BENICAR NORVASC * UNIRETIC * HCTZ DIOVAN * LOTREL  EKG  Procedure date:  01/07/2009  Findings:      NSR with rate of 68 no acute changes

## 2010-02-03 NOTE — Assessment & Plan Note (Signed)
Summary: gyn exam/rbh   Vital Signs:  Patient profile:   55 year old female Height:      67 inches Weight:      204.25 pounds BMI:     32.11 Temp:     98.4 degrees F oral Pulse rate:   64 / minute Pulse rhythm:   regular BP sitting:   124 / 70  (left arm) Cuff size:   large  Vitals Entered By: Lewanda Rife LPN (March 07, 1608 2:25 PM)  Serial Vital Signs/Assessments:  Time      Position  BP       Pulse  Resp  Temp     By                     120/70                         Judith Part MD   History of Present Illness: has been doing well - bp in very good control   started back with her exercise goes to the gym at least 30 min per day -- cardio and weights she really enjoys   is not as good with diet as she should be  fried foods and sweets   wants to loose to under 200 lb   no headaches or flushing  edema is minimal  overall is happy about her bp  Allergies: 1)  ! * Benicar 2)  ! Spironolactone 3)  Norvasc 4)  * Uniretic 5)  * Hctz 6)  Diovan  Past History:  Past Medical History: Last updated: 06/19/2007 Hypertension hyperglycemia deviated septum- symptomatic  bunion foot deformity stress reaction/anxiety HSV   Past Surgical History: Last updated: 07/06/2006 Hysterectomy- partial (06/2000) Endometrial ablation Colonosxopy (01/2000) Dexa- normal (12/2005) LS film- mod OA changes (12/2005), Deg changes LS-5 Colonoscopy- neg (03/2006)  Family History: Last updated: 01/07/2009 Father: HTN, CAD with CABG Mother: HTN Siblings:  aunts- heart problems   Social History: Last updated: 06/19/2007 Marital Status: single (divorced ) Children: 2 Occupation: Engineer, site, 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 Eyes:  Denies blurring and discharge. CV:  Denies chest  pain or discomfort, lightheadness, palpitations, and shortness of breath with exertion. Resp:  Denies cough, shortness of breath, and wheezing. GI:  Denies abdominal pain and indigestion. MS:  Denies muscle aches. Derm:  Denies poor wound healing and rash. Neuro:  Denies headaches and tingling. Endo:  Denies cold intolerance, excessive thirst, excessive urination, and heat intolerance. Heme:  Denies abnormal bruising and bleeding.   Impression & Recommendations:  Problem # 1:  HYPERTENSION (ICD-401.9) Assessment Improved  great control now on current meds rev renal labs disc healthy diet (low simple sugar/ choose complex carbs/ low sat fat) diet and exercise in detail  rev low sodium diet enc to keep up the good exercise and wt loss effort  plan labs and f/u in aug for check up Her updated medication list for this problem includes:    Lotrel 5-20 Mg Caps (Amlodipine besy-benazepril hcl) .Marland Kitchen... 1 by mouth each am    Hydrochlorothiazide 50 Mg Tabs (Hydrochlorothiazide) .Marland Kitchen... 1 by mouth once daily  Orders: Prescription Created Electronically [id number])  Complete Medication List: 1)  Fish Oil 600 Mg  .... Take two by mouth  daily 2)  Multiple Vitamins Tabs (Multiple vitamin) .... Take by mouth as directed 3)  Potassium Chloride Cr 10 Meq Cr-tabs (Potassium chloride) .Marland Kitchen.. 1 by mouth once daily 4)  Lotrel 5-20 Mg Caps (Amlodipine besy-benazepril hcl) .Marland Kitchen.. 1 by mouth each am 5)  Hydrochlorothiazide 50 Mg Tabs (Hydrochlorothiazide) .Marland Kitchen.. 1 by mouth once daily  Patient Instructions: 1)  I sent all your px to pharmacy to file  2)  no change in medicines  3)  keep up the good exercise 4)  start working on diet and good water intake  5)  schedule fasting labs and then follow up in august wellness profile/ lipids v70.0 , 401.1 (for PE) Prescriptions: HYDROCHLOROTHIAZIDE 50 MG TABS (HYDROCHLOROTHIAZIDE) 1 by mouth once daily  #30 x 11   Entered and Authorized by:   Judith Part MD   Signed  by:   Judith Part MD on 03/07/2009   Method used:   Electronically to        [facility]. [address] [phone]* (retail)       [address] [place], [place]  [address]       Ph: [phone]       Fax: [phone]   RxID:   [id number] LOTREL 5-20 MG CAPS (AMLODIPINE BESY-BENAZEPRIL HCL) 1 by mouth each am  #30 x 11   Entered and Authorized by:   Judith Part MD   Signed by:   Judith Part MD on 03/07/2009   Method used:   Electronically to        [facility]. [address] [phone]* (retail)       [address] [place], [place]  [address]       Ph: [phone]       Fax: [phone]   RxID:   [id number] POTASSIUM CHLORIDE CR 10 MEQ CR-TABS (POTASSIUM CHLORIDE) 1 by mouth once daily  #30 x 11   Entered and Authorized by:   Judith Part MD   Signed by:   Judith Part MD on 03/07/2009   Method used:   Electronically to        [facility]. [address] [phone]* (retail)       [address] [place], [place]  [id number]       Ph: [phone]       Fax: [phone]   RxID:   [id number]   Current Allergies (reviewed today): ! * BENICAR ! SPIRONOLACTONE NORVASC * UNIRETIC * HCTZ DIOVAN

## 2010-02-03 NOTE — Progress Notes (Signed)
Summary: regarding BP  Phone Note Call from Patient   Caller: Patient Call For: Judith Part MD Summary of Call: Pt had her BP checked at work and it is 160/98, and she says her left arm felt a little achy earlier.  She didnt take her altace this morning, she thought she was supposed to stop that along with hctz.  I advised her she needs to continue to take altace along with spironolactone.  She will have the school nurse check her BP a couple of more times today. Initial call taken by: Lowella Petties CMA,  January 23, 2009 12:10 PM  Follow-up for Phone Call        thank you - do not stop the altace  let me know if bp does not come back down Follow-up by: Judith Part MD,  January 23, 2009 1:17 PM

## 2010-02-03 NOTE — Assessment & Plan Note (Signed)
Summary: CPX W/PAP ????/RBH   Vital Signs:  Patient profile:   55 year old female Height:      67 inches Weight:      200 pounds BMI:     31.44 Temp:     97.9 degrees F oral Pulse rate:   64 / minute Pulse rhythm:   regular BP sitting:   106 / 70  (left arm) Cuff size:   large  Vitals Entered By: Lewanda Rife LPN (August 13, 2009 10:41 AM) CC: CPX LMP partial hyst 2006, Hypertension Management   History of Present Illness: here for wellness exam and to review chronic health problems   has been feeling pretty good   head and ear are hurting- allergy pill helps (generic claritin?) R ear hurts  no colored d/c  no fever   lipids are good with LDL 107 and HDL 78 good sugar in the 90s  down some weight and working on it  is now running on the treadmill- enjoys that   pap was 06-- and part hyst in past no symptoms at all , no problems   mam 3/11 self exam- no lumps or problems   nl colonosc 08  other labs good   Td 04  wt down 4 lb with bmi of 31  HTN well controlled with 106/70 today  nl dexa in 07 ca and d -- is taking that   Hypertension History:      Positive major cardiovascular risk factors include female age 69 years old or older and hypertension.  Negative major cardiovascular risk factors include non-tobacco-user status.     Allergies: 1)  ! * Benicar 2)  ! Spironolactone 3)  Norvasc 4)  * Uniretic 5)  * Hctz 6)  Diovan  Past History:  Past Medical History: Last updated: 06/19/2007 Hypertension hyperglycemia deviated septum- symptomatic  bunion foot deformity stress reaction/anxiety HSV   Past Surgical History: Last updated: 07/06/2006 Hysterectomy- partial (06/2000) Endometrial ablation Colonosxopy (01/2000) Dexa- normal (12/2005) LS film- mod OA changes (12/2005), Deg changes LS-5 Colonoscopy- neg (03/2006)  Family History: Last updated: 01/07/2009 Father: HTN, CAD with CABG Mother: HTN Siblings:  aunts- heart problems    Social History: Last updated: 06/19/2007 Marital Status: single (divorced ) Children: 2 Occupation: Engineer, site, 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 Eyes:  Denies blurring and eye irritation. ENT:  Complains of earache, nasal congestion, and postnasal drainage; denies sinus pressure and sore throat. CV:  Denies chest pain or discomfort and palpitations. Resp:  Denies cough, shortness of breath, sputum productive, and wheezing. GI:  Denies abdominal pain, change in bowel habits, indigestion, and nausea. GU:  Denies abnormal vaginal bleeding, discharge, dysuria, and urinary frequency. MS:  Denies joint pain, joint redness, and joint swelling. Derm:  Denies itching, lesion(s), poor wound healing, and rash. Neuro:  Denies numbness and tingling. Psych:  mood is fairly good lately. Endo:  Denies excessive thirst and excessive urination. Heme:  Denies abnormal bruising and bleeding.  Physical Exam  General:  overweight but generally well appearing  Head:  normocephalic, atraumatic, and no abnormalities observed.  no sinus tenderness Eyes:  vision grossly intact, pupils equal, pupils round, and pupils reactive to light.  no conjunctival pallor, injection or icterus  Ears:  L ear normal.  R TM slt retracted - no erythema  Nose:  dev septum mild congestion noted bilat  Mouth:  pharynx pink and moist, no erythema, and no exudates.   Neck:  supple with full rom and no masses or thyromegally, no JVD or carotid bruit  Chest Wall:  No deformities, masses, or tenderness noted. Breasts:  No mass, nodules, thickening, tenderness, bulging, retraction, inflamation, nipple discharge or skin changes noted.   Lungs:  Normal respiratory effort, chest expands symmetrically. Lungs are clear to auscultation, no crackles  or wheezes. Heart:  Normal rate and regular rhythm. S1 and S2 normal without gallop, murmur, click, rub or other extra sounds. Abdomen:  Bowel sounds positive,abdomen soft and non-tender without masses, organomegaly or hernias noted. no renal bruits  Msk:  No deformity or scoliosis noted of thoracic or lumbar spine.   Pulses:  R and L carotid,radial,femoral,dorsalis pedis and posterior tibial pulses are full and equal bilaterally Extremities:  No clubbing, cyanosis, edema, or deformity noted with normal full range of motion of all joints.   Neurologic:  sensation intact to light touch, gait normal, and DTRs symmetrical and normal.   Skin:  Intact without suspicious lesions or rashes Cervical Nodes:  No lymphadenopathy noted Axillary Nodes:  No palpable lymphadenopathy Inguinal Nodes:  No significant adenopathy Psych:  normal affect, talkative and pleasant    Impression & Recommendations:  Problem # 1:  HEALTH MAINTENANCE EXAM (ICD-V70.0) Assessment Comment Only reviewed health habits including diet, exercise and skin cancer prevention reviewed health maintenance list and family history enc to keep up the great habits   Problem # 2:  HYPOKALEMIA (ICD-276.8) Assessment: Unchanged good K level on current supplement - no change   Problem # 3:  HYPERTENSION (ICD-401.9) Assessment: Improved  bp is very good on current doses new px for new pharmacy given keep up diet and exercise  Her updated medication list for this problem includes:    Lotrel 5-20 Mg Caps (Amlodipine besy-benazepril hcl) .Marland Kitchen... 1 by mouth each am    Hydrochlorothiazide 50 Mg Tabs (Hydrochlorothiazide) .Marland Kitchen... 1 by mouth once daily  BP today: 106/70 Prior BP: 124/70 (03/07/2009)  10 Yr Risk Heart Disease: 3 %  Labs Reviewed: K+: 3.9 (08/11/2009) Creat: : 0.7 (08/11/2009)   Chol: 203 (08/11/2009)   HDL: 78.30 (08/11/2009)   LDL: 124 (03/04/2008)   TG: 100.0 (08/11/2009)  Problem # 4:  ALLERGIC RHINITIS  (ICD-477.9) Assessment: New with sinus and ear c/o intermittently suspect ragweed allergy this season  adv continued use of claritin otc - and update if fever/ purulent d/c or worse pain -- signs of sinus infection  Complete Medication List: 1)  Fish Oil 600 Mg  .... Take two by mouth daily 2)  Multiple Vitamins Tabs (Multiple vitamin) .... Take by mouth as directed 3)  Potassium Chloride Cr 10 Meq Cr-tabs (Potassium chloride) .Marland Kitchen.. 1 by mouth once daily 4)  Lotrel 5-20 Mg Caps (Amlodipine besy-benazepril hcl) .Marland Kitchen.. 1 by mouth each am 5)  Hydrochlorothiazide 50 Mg Tabs (Hydrochlorothiazide) .Marland Kitchen.. 1 by mouth once daily  Hypertension Assessment/Plan:      The patient's hypertensive risk group is category B: At least one risk factor (excluding diabetes) with no target organ damage.  Her calculated 10 year risk of coronary heart disease is 3 %.  Today's blood pressure is 106/70.    Patient Instructions: 1)  here are your px to take to a new pharmacy  2)  keep up the great diet and exercise 3)  labs look good  4)  the current recommendation for  calcium intake is 1200-1500 mg daily with [phone] IU of vitamin D  5)  take claritin for nasal and ear symptoms 6)  if fever/ more pain or colored nasal discharge- let me know  Prescriptions: HYDROCHLOROTHIAZIDE 50 MG TABS (HYDROCHLOROTHIAZIDE) 1 by mouth once daily  #30 x 11   Entered and Authorized by:   Judith Part MD   Signed by:   Judith Part MD on 08/13/2009   Method used:   Print then Give to Patient   RxID:   [id number] LOTREL 5-20 MG CAPS (AMLODIPINE BESY-BENAZEPRIL HCL) 1 by mouth each am  #30 x 11   Entered and Authorized by:   Judith Part MD   Signed by:   Judith Part MD on 08/13/2009   Method used:   Print then Give to Patient   RxID:   [id number] POTASSIUM CHLORIDE CR 10 MEQ CR-TABS (POTASSIUM CHLORIDE) 1 by mouth once daily  #30 x 11   Entered and Authorized by:   Judith Part MD   Signed by:   Judith Part MD on 08/13/2009   Method used:   Print then Give to Patient   RxID:   [id number]   Current Allergies (reviewed today): ! [facility] ! SPIRONOLACTONE NORVASC * UNIRETIC * HCTZ DIOVAN

## 2010-02-03 NOTE — Letter (Signed)
Summary: [facility] Clearance for Surgery Form  [facility] Clearance for Surgery Form   Imported By: Beau Fanny 01/13/2009 15:38:57  _____________________________________________________________________  External Attachment:    Type:   Image     Comment:   External Document

## 2010-02-03 NOTE — Assessment & Plan Note (Signed)
Summary: ?HEMORRHOIDS/CLE   Vital Signs:  Patient profile:   55 year old female Height:      67 inches Weight:      199.75 pounds BMI:     31.40 Temp:     98.1 degrees F oral Pulse rate:   72 / minute Pulse rhythm:   regular BP sitting:   136 / 84  (left arm) Cuff size:   large  Vitals Entered By: Lewanda Rife LPN (November 04, 2009 3:02 PM) CC: ? hemorrhoids, Abdominal Pain   History of Present Illness: thinks she is having hemorroid problems  there is a lump she can feel  does not bother her at all  using Tuks pads  no bleeding at all  no constipation or straining or abd pain not lifting weights  has had hemorroids in the past - thinks external    neg  colonoscopy in 3/08   Allergies: 1)  ! * Benicar 2)  ! Spironolactone 3)  Norvasc 4)  * Uniretic 5)  * Hctz 6)  Diovan  Past History:  Past Medical History: Last updated: 06/19/2007 Hypertension hyperglycemia deviated septum- symptomatic  bunion foot deformity stress reaction/anxiety HSV   Past Surgical History: Last updated: 07/06/2006 Hysterectomy- partial (06/2000) Endometrial ablation Colonosxopy (01/2000) Dexa- normal (12/2005) LS film- mod OA changes (12/2005), Deg changes LS-5 Colonoscopy- neg (03/2006)  Family History: Last updated: 01/07/2009 Father: HTN, CAD with CABG Mother: HTN Siblings:  aunts- heart problems   Social History: Last updated: 06/19/2007 Marital Status: single (divorced ) Children: 2 Occupation: Engineer, site, also going to school for teaching degree  non smoker no alcohol regular exercise   Risk Factors: Smoking Status: never (05/16/2006)  Review of Systems General:  Denies chills, fatigue, fever, loss of appetite, and malaise. Eyes:  Denies blurring and eye irritation. CV:  Denies chest pain or discomfort and palpitations. Resp:  Denies cough, shortness of breath, and wheezing. GI:  Denies abdominal pain, bloody stools, change in bowel habits,  indigestion, nausea, and vomiting. Derm:  Denies itching and rash. Heme:  Denies abnormal bruising and bleeding.  Physical Exam  General:  overweight but generally well appearing  Head:  normocephalic, atraumatic, and no abnormalities observed.   Heart:  Normal rate and regular rhythm. S1 and S2 normal without gallop, murmur, click, rub or other extra sounds. Abdomen:  Bowel sounds positive,abdomen soft and non-tender without masses, organomegaly or hernias noted. Rectal:  external hemorroid 4 mm at 7:00- soft and nt nl rectal tone heme neg stool Skin:  Intact without suspicious lesions or rashes Inguinal Nodes:  No significant adenopathy Psych:  normal affect, talkative and pleasant    Impression & Recommendations:  Problem # 1:  HEMORRHOIDS, EXTERNAL (ICD-455.3) Assessment New non thrombosed and not painful disc strategies for control and tx handout given from [facility] to read  update if inc size or any pain or bleeding disc avoiding constipation  Complete Medication List: 1)  Fish Oil 600 Mg  .... Take two by mouth daily 2)  Multiple Vitamins Tabs (Multiple vitamin) .... Take by mouth as directed 3)  Potassium Chloride Cr 10 Meq Cr-tabs (Potassium chloride) .Marland Kitchen.. 1 by mouth once daily 4)  Lotrel 5-20 Mg Caps (Amlodipine besy-benazepril hcl) .Marland Kitchen.. 1 by mouth each am 5)  Hydrochlorothiazide 50 Mg Tabs (Hydrochlorothiazide) .Marland Kitchen.. 1 by mouth once daily   Patient Instructions: 1)  I think you have a small external hemorroid that is not inflammed 2)  try to avoid constipation and straining  3)  tuks pads and prep H are fine to use  4)  let me know if it becomes painful or bigger 5)  here is a handout to read    Orders Added: 1)  Est. Patient Level III [16109]    Current Allergies (reviewed today): ! * BENICAR ! SPIRONOLACTONE NORVASC * UNIRETIC * HCTZ DIOVAN

## 2010-02-03 NOTE — Progress Notes (Signed)
Summary: ? UTI  Phone Note Call from Patient Call back at [phone]   Summary of Call: Pt is complaining of burning with urination, dark color to urine since this morning.  Can she come in and leave a sample since there are no appts available today? Initial call taken by: Lowella Petties CMA,  March 10, 2009 11:53 AM  Follow-up for Phone Call        come in and leave ua -- add on to this phone note and send back to me  Follow-up by: Judith Part MD,  March 10, 2009 11:56 AM  Additional Follow-up for Phone Call Additional follow up Details #1::        Pt will come this afternoon between 4 and 4:30pm.Pt uses [facility] on [address] for pharmacy if needed. Lewanda Rife LPN  March 11, 4538 1:07 PM   New Problems: UTI (ICD-599.0)   Additional Follow-up for Phone Call Additional follow up Details #2::    Patient notified as instructed by telephone. Medication phoned to [facility] pharmacy as instructed. Lewanda Rife LPN  March 10, 9809 4:30 PM   New Problems: UTI (ICD-599.0) New/Updated Medications: CIPRO 250 MG TABS (CIPROFLOXACIN HCL) 1 by mouth two times a day for 5 days Prescriptions: CIPRO 250 MG TABS (CIPROFLOXACIN HCL) 1 by mouth two times a day for 5 days  #10 x 0   Entered and Authorized by:   Judith Part MD   Signed by:   Lewanda Rife LPN on 91/47/8295   Method used:   Telephoned to ...       [facility] [address] [phone]* (retail)       [address] [place], [place]  [id number]       Ph: [phone]       Fax: [phone]   RxID:   [id number]   Laboratory Results   Urine Tests   Date/Time Reported: March 10, 2009 2:33 PM   Routine Urinalysis   Color: straw Appearance: Cloudy Glucose: negative   (Normal Range: Negative) Bilirubin: negative   (Normal Range: Negative) Ketone: negative   (Normal Range: Negative) Spec. Gravity: 1.010   (Normal Range: 1.003-1.035) Blood: large   (Normal Range: Negative) pH: 6.0   (Normal Range:  5.0-8.0) Protein: 30   (Normal Range: Negative) Urobilinogen: 0.2   (Normal Range: 0-1) Nitrite: negative   (Normal Range: Negative) Leukocyte Esterace: moderate   (Normal Range: Negative)    Comments: looks like uti  I want to tx with cipro--px written on EMR for call in  continue drinking lots of water call or seek care is symptoms don't improve in 2-3 days or if you develop back pain, nausea, or vomiting (follow up if not improved in several days)

## 2010-02-10 ENCOUNTER — Other Ambulatory Visit: Payer: Self-pay | Admitting: Family Medicine

## 2010-02-10 DIAGNOSIS — Z1231 Encounter for screening mammogram for malignant neoplasm of breast: Secondary | ICD-10-CM

## 2010-03-19 ENCOUNTER — Ambulatory Visit (INDEPENDENT_AMBULATORY_CARE_PROVIDER_SITE_OTHER): Payer: BC Managed Care – PPO | Admitting: Family Medicine

## 2010-03-19 ENCOUNTER — Encounter: Payer: Self-pay | Admitting: Family Medicine

## 2010-03-19 DIAGNOSIS — J209 Acute bronchitis, unspecified: Secondary | ICD-10-CM

## 2010-03-24 ENCOUNTER — Other Ambulatory Visit: Payer: Self-pay | Admitting: Family Medicine

## 2010-03-24 NOTE — Assessment & Plan Note (Signed)
Summary: HOARSE   Vital Signs:  Patient profile:   55 year old female Height:      67 inches Weight:      207.75 pounds BMI:     32.66 Temp:     97.8 degrees F oral Pulse rate:   72 / minute Pulse rhythm:   regular BP sitting:   120 / 78  (left arm) Cuff size:   large  Vitals Entered By: Benny Lennert CMA Duncan Dull) (March 19, 2010 9:53 AM)  History of Present Illness: Chief complaint hoarse  55 year old female:  voice has been scratchy for about two weeks did have a cold an ccough, spitting up green mucous and had a dry cough.  Throat would not clear.   Dry cough Produces some, a little less sputum, but she is still producing some.  Acute Visit History:      The patient complains of cough, nasal discharge, sinus problems, and sore throat.  These symptoms began 2 weeks ago.  She denies diarrhea, earache, fever, headache, musculoskeletal symptoms, and nausea.        There is no history of wheezing, sleep interference, shortness of breath, respiratory retractions, tachypnea, cyanosis, or interference with oral intake associated with her cough.        'Cold' or URI symptoms have been present with the sore throat.  There is no history of dysphagia, drooling, or recent exposure to strep.        Allergies: 1)  ! * Benicar 2)  ! Spironolactone 3)  Norvasc 4)  * Uniretic 5)  * Hctz 6)  Diovan  Past History:  Past medical, surgical, family and social histories (including risk factors) reviewed, and no changes noted (except as noted below).  Past Medical History: Reviewed history from 06/19/2007 and no changes required. Hypertension hyperglycemia deviated septum- symptomatic  bunion foot deformity stress reaction/anxiety HSV   Past Surgical History: Reviewed history from 07/06/2006 and no changes required. Hysterectomy- partial (06/2000) Endometrial ablation Colonosxopy (01/2000) Dexa- normal (12/2005) LS film- mod OA changes (12/2005), Deg changes LS-5 Colonoscopy-  neg (03/2006)  Family History: Reviewed history from 01/07/2009 and no changes required. Father: HTN, CAD with CABG Mother: HTN Siblings:  aunts- heart problems   Social History: Reviewed history from 06/19/2007 and no changes required. 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 CV: No chest pain or SOB GI: No noted N or V Otherwise, pertinent positives and negatives are noted in the HPI.   Physical Exam  Additional Exam:  GEN: A and O x 3. WDWN. NAD.    ENT: Nose clear, ext NML.  No LAD.  No JVD.  TM's clear. Oropharynx clear.  PULM: Normal WOB, no distress. No crackles, wheezes, rhonchi. CV: RRR, no M/G/R, No rubs, No JVD.   ABD: S, NT, ND, + BS. No rebound. No guarding. No HSM.   EXT: warm and well-perfused, No c/c/e. PSYCH: Pleasant and conversant.    Impression & Recommendations:  Problem # 1:  BRONCHITIS- ACUTE (ICD-466.0) Assessment New viral vs bacterial pathogen 14 days history, cover for atypicals  Her updated medication list for this problem includes:    Azithromycin 250 Mg Tabs (Azithromycin) .Marland Kitchen... 2 by  mouth today and then 1 daily for 4 days  Complete Medication List: 1)  Fish Oil 600 Mg  .... Take two by mouth daily 2)  Multiple Vitamins Tabs (Multiple vitamin) .... Take by mouth as directed 3)  Potassium Chloride Cr 10 Meq Cr-tabs (Potassium chloride) .Marland Kitchen.. 1 by mouth once daily 4)  Lotrel 5-20 Mg Caps (Amlodipine besy-benazepril hcl) .Marland Kitchen.. 1 by mouth each am 5)  Hydrochlorothiazide 50 Mg Tabs (Hydrochlorothiazide) .Marland Kitchen.. 1 by mouth once daily 6)  Azithromycin 250 Mg Tabs (Azithromycin) .... 2 by  mouth today and then 1 daily for 4 days Prescriptions: AZITHROMYCIN 250 MG  TABS (AZITHROMYCIN) 2 by  mouth today and then 1 daily for 4 days  #6 x 0   Entered and  Authorized by:   Hannah Beat MD   Signed by:   Hannah Beat MD on 03/19/2010   Method used:   Electronically to        [facility] [address] [phone]* (retail)       [address] [place], [place]  [id number]       Ph: [phone]       Fax: [phone]   RxID:   [id number]    Orders Added: 1)  Est. Patient Level IV [40347]    Current Allergies (reviewed today): ! * BENICAR ! SPIRONOLACTONE NORVASC * UNIRETIC * HCTZ DIOVAN

## 2010-03-27 ENCOUNTER — Other Ambulatory Visit: Payer: Self-pay | Admitting: *Deleted

## 2010-03-27 MED ORDER — HYDROCHLOROTHIAZIDE 50 MG PO TABS: 50.0000 mg | ORAL_TABLET | Freq: Every day | ORAL | Status: DC

## 2010-03-27 NOTE — Telephone Encounter (Signed)
Pt was given written rx for med for 1yr refill on 08/13/09. Consuella Lose with [facility] will ck with pt about rx.

## 2010-04-06 ENCOUNTER — Encounter: Payer: Self-pay | Admitting: Family Medicine

## 2010-04-06 ENCOUNTER — Ambulatory Visit: Payer: BC Managed Care – PPO | Admitting: Family Medicine

## 2010-04-06 ENCOUNTER — Ambulatory Visit (HOSPITAL_COMMUNITY)
Admission: RE | Admit: 2010-04-06 | Discharge: 2010-04-06 | Disposition: A | Discharge status: Home / Self Care | Payer: BC Managed Care – PPO | Source: Ambulatory Visit | Attending: Family Medicine | Admitting: Family Medicine

## 2010-04-06 DIAGNOSIS — Z1231 Encounter for screening mammogram for malignant neoplasm of breast: Secondary | ICD-10-CM

## 2010-04-06 LAB — HM MAMMOGRAPHY: HM Mammogram: NORMAL

## 2010-04-08 ENCOUNTER — Telehealth: Payer: Self-pay | Admitting: *Deleted

## 2010-04-08 NOTE — Telephone Encounter (Signed)
That does sound more like hemorroids Prep H otc is fine to try  Avoid straining if possible - stool softener otc may help  F/u as planned but if symptoms worsen f/u sooner with first availible

## 2010-04-08 NOTE — Telephone Encounter (Signed)
Pt states she has some burning after BM's, some bleeding when she wipes.  Says it feels like it could be a herpes outbreak.  She has hx of hemorrhoids, I advised she could have internal ones that she cant see.  She has appt to see you on 4/17, I suggested she bring this up to you then.

## 2010-04-08 NOTE — Telephone Encounter (Signed)
Patient notified as instructed by telephone. 

## 2010-04-21 ENCOUNTER — Ambulatory Visit: Payer: BC Managed Care – PPO | Admitting: Family Medicine

## 2010-05-22 NOTE — H&P (Signed)
[facility]  Patient:    Rebecca [name], Rebecca [name]                        MRN: [id number] Attending:  Rande Brunt. Eda Paschal, M.D.                         History and Physical  CHIEF COMPLAINT:  Cyclical pelvic pain with bleeding.  HISTORY OF PRESENT ILLNESS:  The patient is a 55 year old, gravida 3, para 2, Ab 1, who had an endometrial ablation done in 1998 by Dr. Gildardo Griffes for dysfunctional uterine bleeding. For a year after the procedure was done, she still continued to have her periods. Her periods have now stopped in terms of being normal amount; they are just some light spotting monthly but she is started to get severe monthly discomfort when she has this light bleeding. She has developed some cervical stenosis which I suspect plays a role in that, but I also believe that she is now building up endometrium on a monthly basis which is causing the above symptoms. She had an ultrasound in the office which showed that she clearly still has endometrium in spite of the ablation, and the cavity is very bizarre looking, almost consistent with a bicornuate uterus; I believe that is the areas that have been fully ablated versus the ones where there is still endometrium. As a result of this cyclic discomfort which has not responded to any medical treatment, she now enters the hospital for vaginal hysterectomy. She does understand that I cannot absolutely guarantee her that this will resolve the above, but I suspect it will. We are going to preserve her ovaries unless we find significant disease, in which case she has given me permission to remove them.  PAST MEDICAL HISTORY:  Hypertension that has been treated with Maxzide, although she is not currently taking her medicine.  PAST SURGICAL HISTORY:   Endometrial ablation.  ALLERGIES:  None.  FAMILY HISTORY:  Completely noncontributory.  SOCIAL HISTORY:  She is a nonsmoker, nondrinker.  REVIEW OF SYSTEMS:  HEENT:  Negative. Cardiac: Hypertension. Respiratory: Negative. GI: Some irritable bowel syndrome. GU: Negative. Neuromuscular: Negative. Endocrine: Negative.  PHYSICAL EXAMINATION:  GENERAL:  Well-developed, well-nourished female in no acute distress.  VITAL SIGNS:  Blood pressure 136/86, pulse 80 and regular, respirations 16, unlabored. She is afebrile.  HEENT:  All within normal limits.  NECK:  Supple, trachea in the midline. Thyroid is not enlarged.  LUNGS:  Clear to P&A.  HEART:  No ______ murmurs.  BREASTS:  No masses.  ABDOMEN:  Soft without guarding, rebound, or masses.  PELVIC:  External and vaginal is within normal limits. Cervix is clean but stenotic. Uterus is top normal size and shape. Adnexa are not palpably enlarged.  RECTAL:  Confirmatory.  EXTREMITIES:  Within normal limits.  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  PLAN:  Vaginal hysterectomy for above. DD:  06/02/00 TD:  06/03/00 Job: [id number]

## 2010-05-22 NOTE — Assessment & Plan Note (Signed)
[facility]                                   ON-CALL NOTE   NAME:[name], Rebecca WAITERS                      MRN:          [id number]  DATE:11/06/2005                            DOB:          1955/10/22    PCP is Dr. Milinda Antis.  Rebecca [name] calls in today stating that she has had  congestion, laryngitis and mild cough for the last week.  She reports the  symptoms have not improved.  The reason she is calling is because Dr. Milinda Antis  did give her a prescription for an antibiotic and advised her to take it if  her symptoms do not improve.  She was wondering if she could go ahead and  take it.   The patient denied any shortness of breath, fevers or dyspnea on exertion.  She reports that the symptoms have not worsened, but have not improved over  the last week to 10 days.  I did advise the patient to go ahead and fill the  prescription given by Dr. Milinda Antis, and to follow if her symptoms worsen or do  not improve.    ______________________________  Leanne Chang, M.D.    LA/MedQ  DD: 11/06/2005  DT: 11/07/2005  Job #: [id number]   cc:   Marne A. Milinda Antis, MD

## 2010-05-22 NOTE — Op Note (Signed)
[facility]  Patient:    Rebecca [name], Rebecca [name]                        MRN: [id number] Proc. Date: 06/02/00 Adm. Date:  [id number] Attending:  Sharon Mt                           Operative Report  PREOPERATIVE DIAGNOSES:  Cyclic pelvic pain and bleeding after endometrial ablation.  POSTOPERATIVE DIAGNOSES:  Cyclic pelvic pain and bleeding after endometrial ablation and pelvic adhesive disease.  OPERATION:  Attempted vaginal hysterectomy followed by exploratory laparotomy and total abdominal hysterectomy.  SURGEON:  Daniel L. Eda Paschal, M.D.  FIRST ASSISTANT:  Timothy P. Fontaine, M.D.  FINDINGS:  At the time of surgery, the patients uterus was top-normal size and shape and boggy.  Ovaries and fallopian tubes were free of any disease. When the cul-de-sac was first entered vaginally, there was a lot of free blood in the peritoneal cavity consistent with what I thought she was having which was retrograde menstruation every month.  At the top of the fundus, on the right side, the uterus was densely adherent to the peritoneum and the rectus muscle, making it impossible to finish the surgery vaginally.  DESCRIPTION OF PROCEDURE:  After adequate general endotracheal anesthesia, the patient was placed in the dorsal lithotomy position and prepped and draped in the usual sterile manner.  A 1:200,000 solution of epinephrine and 0.5% Xylocaine was injected around the cervix.  A 360 degree incision was made around the cervix.  The bladder was mobilized superiorly as was the posterior peritoneum.  The posterior peritoneum was entered by sharp dissection.  At this point, there was obvious hemoperitoneum probably from retrograde menstruation.  The vesicouterine fold to peritoneum was sharply dissected free, being very careful because of a previous cesarean section, and then the peritoneum could be entered anteriorly as well.  The uterosacral ligaments were  clamped, cut, and suture ligated with #1 chromic catgut.  They were shortened during this procedure, and they were sutured to the vaginal cuff laterally for good vault support.  The cardinal ligaments and uterine arteries were clamped, cut, and suture ligated with #1 chromic catgut.  At this point, the uterus would not budge.  It was partially delivered; it was cored, and it still would not budge.  Thus the surgeon put his finger around the uterus and could not really see why it would not deliver vaginally, but it obviously appeared to be adherent to something.  It did not feel that it was adherent to bowel, or else I think it would have come down a little bit more, but it was felt at this point that to continue the procedure would be dangerous and also useless in terms of getting the uterus delivered.  At this point, her feet were taken out of the stirrups.  She was reprepped and draped in a sterile manner in a supine position.  A small Pfannenstiel incision was made; the fascia was opened transversely; the peritoneum was entered vertically. Subcutaneous bleeders were clamped and Bovied as encountered.  When the peritoneal cavity was opened, the findings confirmed why we could not deliver her uterus vaginally.  The uterus was densely adherent to the peritoneum and the rectus muscle on the right.  It required very sharp dissection to free it up.  There was a calcified nodule that was encountered as part of the process. It  was sent for frozen, but a frozen could not be done because it was too hard to cut; therefore only a permanent section will be obtained.  Finally, the entire uterus was freed from this area.  The round ligaments were clamped, cut, and suture ligated.  The uteroovarian ligaments and fallopian tubes were clamped, cut, and suture ligated, all with #1 chromic catgut, and now the remainder of the uterus was also sent to pathology for tissue diagnosis. There was no bleeding  noted.  The ovaries and tubes were healthy.  The uterus could be traced, and they had not been involved in any of the dissection.  The vaginal cuff was whip-stitched with a running 0 Vicryl after the vaginal angles had been sutured with #1 chromic catgut.  At this point, there was no bleeding noted; 1000 cc of Ringers Lactate was used for irrigation before the rectus fascia was closed.  A small area of peritoneum and rectus muscle was excised around where the uterus was attached to be sure that no ectopic uterine tissue was left in place, and then the fascia was closed with two running 0 Vicryls.  Subcutaneous tissue was copiously irrigated with the Ringers Lactate, and the skin was closed with staples.  Estimated blood loss for the entire procedure was 750 cc with none replaced.  The patient tolerated the procedure well and left the operating room in satisfactory condition, draining clear urine from her Foley catheter. DD:  06/02/00 TD:  06/02/00 Job: [id number]

## 2010-05-22 NOTE — Discharge Summary (Signed)
[facility]  Patient:    Rebecca [name], Rebecca [name]                        MRN: [id number] Adm. Date:  [id number] Disc. Date: 06/05/00 Attending:  Sharon Mt                           Discharge Summary  HISTORY OF PRESENT ILLNESS:  The patient is a 55 year old female who was admitted to the hospital with cyclic pelvic pain and bleeding after endometrial ablation.  The diagnosis was that she still had endometrium and was retaining menstrual flow, and this was causing the above pain.  HOSPITAL COURSE:  On the day of admission she was taken to the operating room. Initially, a vaginal hysterectomy was started.  She had a lot of free blood in the perineal cavity consistent with retrograde menstruation.  We could not finish the surgery vaginally because her uterus was densely adherent to the rectus muscle from her previous cesarean, so she was converted from a vaginal hysterectomy to an abdominal hysterectomy.  Postoperatively she had an ileus, which responded to IV fluids and Dulcolax suppositories and also had some hypertensive problems that were watched by Dr. Lovell Sheehan.  She had been on Maxzide previously but had not taken her medicine.  Labetalol IV, Vasotec p.o. were added, and by the third postoperative day she was passing gas and week normotensive.  She was discharged on her Maxzide, to see Dr. Milinda Antis in two weeks.  She was to return to see Dr. Eda Paschal in three weeks.  Final pathology report revealed nodule of the uterus, benign partially calcified and ______ serosal nodule without malignancy.  Uterus and cervix no dysplasia. Endometrium still present without hyperplasia.  Uterine serosal fibrous adhesions, and benign fibrous nodule of the peritoneum.  CONDITION ON DISCHARGE:  Improved.  DISCHARGE DIAGNOSES: 1. Pelvic pain and bleeding after endometrial ablation. 2. Pelvic adhesive disease. 3. Leiomyomata uteri.  OPERATIONS:  Initial vaginal  hysterectomy, followed by total abdominal hysterectomy with lysis of adhesions.  CONDITION ON DISCHARGE:  Improved.  DIET:  Regular.  ACTIVITY:  Ambulatory. DD:  06/05/00 TD:  06/05/00 Job: [id number]

## 2010-06-15 ENCOUNTER — Ambulatory Visit (INDEPENDENT_AMBULATORY_CARE_PROVIDER_SITE_OTHER): Payer: BC Managed Care – PPO | Admitting: Family Medicine

## 2010-06-15 ENCOUNTER — Encounter: Payer: Self-pay | Admitting: Family Medicine

## 2010-06-15 DIAGNOSIS — K644 Residual hemorrhoidal skin tags: Secondary | ICD-10-CM

## 2010-06-15 MED ORDER — HYDROCORTISONE 2.5 % RE CREA: TOPICAL_CREAM | RECTAL | Status: AC

## 2010-06-15 MED ORDER — HYDROCORTISONE ACETATE 25 MG RE SUPP: 25.0000 mg | Freq: Every evening | RECTAL | Status: DC | PRN

## 2010-06-15 NOTE — Assessment & Plan Note (Signed)
Actually small hemorroids both internal and external  Given anusol hc to use cream and suppositories prn Handout given also from [facility] Disc prev measures- do not strain and keep stools soft  Update if worse or not improved

## 2010-06-15 NOTE — Patient Instructions (Addendum)
You have both and external and several small internal hemorroids Use the anusol hc cream and suppositories  as needed (not for more than 14 days in a row) Avoid straining and constipation- keep stools soft If worse or not improved - please let me know

## 2010-06-15 NOTE — Progress Notes (Signed)
Subjective:    Patient ID: Rebecca [name], female    DOB: 03-24-1955, 55 y.o.   MRN: [id number]  HPI Having a lot of issues with her hemorroids  They get better and then worse  Irritation after bm  Also after the gym  Does not get constipated  bm is qd to qod  Does not have to strain  Sometimes has a hard time getting clean  Uses tuks pads and also cream   Primarily irritation and pain  Very rarely brb  In past prolapsed internal hemorroid  Has not used any px medicines   No abd pain at all  Feels ok   Patient Active Problem List  Diagnoses  . HSV  . HYPOKALEMIA  . LEUKOCYTOSIS UNSPECIFIED  . STRESS REACTION, ACUTE, WITH EMOTIONAL DISTURBANCE  . HYPERTENSION  . HEMORRHOIDS, EXTERNAL  . DEVIATED NASAL SEPTUM  . ALLERGIC RHINITIS  . BUNIONS, RIGHT FOOT  . URINARY INCONTINENCE, MIXED  . HYPERGLYCEMIA   Past Medical History  Diagnosis Date  . Allergic rhinitis, cause unspecified   . Acute bronchitis   . Bunion   . Deviated nasal septum   . Routine general medical examination at a health care facility   . External hemorrhoids without mention of complication   . Herpes simplex without mention of complication   . Other abnormal glucose   . Unspecified essential hypertension   . Hypopotassemia   . Leukocytosis, unspecified   . Other screening mammogram   . Predominant disturbance of emotions   . 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 Partial hysterectomy   . Endometrial ablation   . Colonoscopy 01/2000  . Dexa 12/2005    Normal  . Ls film 12/07    Mod OA Changes; Deg. changes LS-5  . Colonoscopy 03/2006    Negative   History  Substance Use Topics  . Smoking status: Never Smoker   . Smokeless tobacco: Not on file  . Alcohol Use: No   Family History  Problem Relation Age of Onset  . Hypertension Father   . Coronary artery disease Father     with CABG  . Hypertension Mother    Allergies    Allergen Reactions  . Amlodipine Besylate     REACTION: didn't work  . Moexipril-Hydrochlorothiazide     REACTION: felt bad  . Olmesartan Medoxomil     REACTION: fatigue and tingling  . Spironolactone     REACTION: diarrhea/ did not work  . Valsartan     REACTION: headache   Current Outpatient Prescriptions on File Prior to Visit  Medication Sig Dispense Refill  . amLODipine-benazepril (LOTREL) 5-20 MG per capsule Take 1 capsule by mouth daily.        . hydrochlorothiazide 50 MG tablet Take 1 tablet (50 mg total) by mouth daily.  30 tablet  11  . Multiple Vitamin (MULTIVITAMIN) tablet Take 1 tablet by mouth daily.        . Omega-3 Fatty Acids (FISH OIL CONCENTRATE PO) Take 1 capsule by mouth daily.       . potassium chloride (KLOR-CON) 10 MEQ CR tablet Take 10 mEq by mouth daily.            Review of Systems Review of Systems  Constitutional: Negative for fever, appetite change, fatigue and unexpected weight change.  Eyes: Negative for pain and visual disturbance.  Respiratory: Negative for cough and shortness of breath.   Cardiovascular: Negative.  For cp or sob  Gastrointestinal: Negative for nausea, diarrhea and constipation.  Genitourinary: Negative for urgency and frequency.  Skin: Negative for pallor. some itching in anal area  Neurological: Negative for weakness, light-headedness, numbness and headaches.  Hematological: Negative for adenopathy. Does not bruise/bleed easily.  Psychiatric/Behavioral: Negative for dysphoric mood. The patient is not nervous/anxious.          Objective:   Physical Exam  Constitutional: She appears well-developed and well-nourished.       overwt and well appearing   Eyes: Conjunctivae and EOM are normal. Pupils are equal, round, and reactive to light.  Cardiovascular: Regular rhythm and normal heart sounds.   Genitourinary: Rectal exam shows external hemorrhoid and internal hemorrhoid. Rectal exam shows no fissure, no mass, no tenderness  and anal tone normal. Guaiac negative stool.       Small ext hemm- non thrombosed at 6:00 Several small internal hemorroids noted on anoscopy - not actively bleeding or prolapsed   Musculoskeletal: She exhibits no edema.  Neurological: She is alert.  Skin: Skin is warm and dry. No rash noted. No erythema. No pallor.  Psychiatric: She has a normal mood and affect.          Assessment & Plan:

## 2010-08-17 ENCOUNTER — Other Ambulatory Visit: Payer: Self-pay | Admitting: Family Medicine

## 2010-08-18 ENCOUNTER — Other Ambulatory Visit: Payer: Self-pay | Admitting: *Deleted

## 2010-08-18 NOTE — Telephone Encounter (Signed)
[facility] [address] electronically request Klor con and amlodipine benazepril 5-20mg  #30 x 6 on each.

## 2010-08-24 ENCOUNTER — Ambulatory Visit (INDEPENDENT_AMBULATORY_CARE_PROVIDER_SITE_OTHER): Payer: BC Managed Care – PPO | Admitting: Family Medicine

## 2010-08-24 ENCOUNTER — Encounter: Payer: Self-pay | Admitting: Family Medicine

## 2010-08-24 DIAGNOSIS — S50819A Abrasion of unspecified forearm, initial encounter: Secondary | ICD-10-CM

## 2010-08-24 DIAGNOSIS — IMO0002 Reserved for concepts with insufficient information to code with codable children: Secondary | ICD-10-CM

## 2010-08-24 DIAGNOSIS — B354 Tinea corporis: Secondary | ICD-10-CM

## 2010-08-24 MED ORDER — CLOTRIMAZOLE 1 % EX CREA: TOPICAL_CREAM | Freq: Two times a day (BID) | CUTANEOUS | Status: AC

## 2010-08-24 NOTE — Progress Notes (Signed)
  Subjective:    Patient ID: Rebecca [name], female    DOB: 12/09/1955, 55 y.o.   MRN: [id number]  HPI CC: rash  1 wk h/o rash L upper shoulder, noticed redness and not improving.  Round lesion, wonders if ringworm.  Not itching, no pain, bleeding.  Hasn't tried anything for this so far.  Also spot on left forearm.  Doesn't remember when injured forearm.  Has been using peroxide for this pretty regularly.  Scab formed.  One boy at summer camp with ringworm, she interacted with him a lot.  Review of Systems Per HPI    Objective:   Physical Exam AFVSS, NAD  Left forearm with healing abrasion, some erythema on medial edge Left upper shoulder with annular erythematous scaly macule with central clearing, 2cm diameter       Assessment & Plan:

## 2010-08-24 NOTE — Assessment & Plan Note (Signed)
KOH positive Treat with clotrimazole x4 wks. Update if not improving as expected.

## 2010-08-24 NOTE — Assessment & Plan Note (Signed)
Anticipate poor healing due to continued peroxide. Advised stop, use abx ointment given minimal erythema on medial border. Update if not healing as expected.

## 2010-08-24 NOTE — Patient Instructions (Addendum)
For forearm - stop peroxide.  Start antibiotic ointment daily with bandaid. For shoulder - looks like fungal infection - ringworm.  Treat with clotrimazole twice daily for 4 wks.  Update [place] if not improving as expected. Good to see you today, call us with questions.

## 2010-09-23 ENCOUNTER — Ambulatory Visit: Payer: BC Managed Care – PPO | Admitting: Family Medicine

## 2010-10-06 ENCOUNTER — Other Ambulatory Visit: Payer: Self-pay | Admitting: *Deleted

## 2010-10-06 MED ORDER — HYDROCORTISONE ACETATE 25 MG RE SUPP: 25.0000 mg | Freq: Every evening | RECTAL | Status: DC | PRN

## 2010-10-06 NOTE — Telephone Encounter (Signed)
Left vm for pt to callback 

## 2010-10-06 NOTE — Telephone Encounter (Signed)
Pt is asking for refill on suppositories for hemorrhoids. Uses [facility] [address]

## 2010-10-06 NOTE — Telephone Encounter (Signed)
Will refill electronically  

## 2010-10-07 NOTE — Telephone Encounter (Signed)
Pt was advised to check with the pharmacy today.

## 2010-10-12 ENCOUNTER — Ambulatory Visit (INDEPENDENT_AMBULATORY_CARE_PROVIDER_SITE_OTHER): Payer: BC Managed Care – PPO | Admitting: Family Medicine

## 2010-10-12 ENCOUNTER — Encounter: Payer: Self-pay | Admitting: Family Medicine

## 2010-10-12 VITALS — BP 118/72 | HR 64 | Temp 98.0°F | Ht 67.0 in | Wt 202.0 lb

## 2010-10-12 DIAGNOSIS — K644 Residual hemorrhoidal skin tags: Secondary | ICD-10-CM

## 2010-10-12 DIAGNOSIS — I10 Essential (primary) hypertension: Secondary | ICD-10-CM

## 2010-10-12 DIAGNOSIS — E876 Hypokalemia: Secondary | ICD-10-CM

## 2010-10-12 DIAGNOSIS — K648 Other hemorrhoids: Secondary | ICD-10-CM | POA: Insufficient documentation

## 2010-10-12 LAB — COMPREHENSIVE METABOLIC PANEL
ALT: 17 U/L (ref 0–35)
AST: 20 U/L (ref 0–37)
Albumin: 4.4 g/dL (ref 3.5–5.2)
Alkaline Phosphatase: 61 U/L (ref 39–117)
BUN: 15 mg/dL (ref 6–23)
CO2: 30 mEq/L (ref 19–32)
Calcium: 9.2 mg/dL (ref 8.4–10.5)
Chloride: 99 mEq/L (ref 96–112)
Creatinine, Ser: 1 mg/dL (ref 0.4–1.2)
GFR: 76.92 mL/min (ref >60.00)
Glucose, Bld: 111 mg/dL — ABNORMAL HIGH (ref 70–99)
Potassium: 3.5 mEq/L (ref 3.5–5.1)
Sodium: 139 mEq/L (ref 135–145)
Total Bilirubin: 0.6 mg/dL (ref 0.3–1.2)
Total Protein: 7.7 g/dL (ref 6.0–8.3)

## 2010-10-12 NOTE — Assessment & Plan Note (Signed)
bp appears to be under very good control with current med  hctz does deplete K and will re check this in light of c/o about whole pill not dissolving  No symptoms  No change in med  Check K today

## 2010-10-12 NOTE — Patient Instructions (Signed)
We will do referral to GI at check out Labs today- then will update you about potassium Blood pressure is good Let us know if you change your mind about a flu shot Keep up the good health habits

## 2010-10-12 NOTE — Assessment & Plan Note (Signed)
Check K today with c/o of whole pill not dissolving  Is on hctz No cramping or other symptoms

## 2010-10-12 NOTE — Assessment & Plan Note (Signed)
hemorroid problem worsening and not resp to anusol hc Pt strains lifting at the gym and running  No hard bms or constipation Will ref to GI to disc other opt for eval and tx of hemorrhoids

## 2010-10-12 NOTE — Progress Notes (Signed)
Subjective:    Patient ID: Rebecca [name], female    DOB: 1955/01/15, 55 y.o.   MRN: [id number]  HPI Here for hemorrhoids and also concern about her K supplement  Burning inside and outside - both inside and outside  Has noticed some bleeding now too - streaks and small amounts  Feeling swollen in general- hard to clean herself   Is not straining a lot - tends to have nl bms  Last colonosc was in 2008 or 2009 No abd pain    Last visit was tx for int and ext hem with anusol hc-- suppository and cream  It helps - but takes longer to get relief    Has lost 10 lb More exercise and eats a healthier diet  Tried sensa-and did not work for her   Notes her klor con goes through stool looking undissolved - unsure if it is working  No cramping or other symptoms bp well controlled No side eff - is on lotrel and hctz  No cp or ha or edema   Patient Active Problem List  Diagnoses  . HSV  . HYPOKALEMIA  . LEUKOCYTOSIS UNSPECIFIED  . STRESS REACTION, ACUTE, WITH EMOTIONAL DISTURBANCE  . HYPERTENSION  . HEMORRHOIDS, EXTERNAL  . DEVIATED NASAL SEPTUM  . ALLERGIC RHINITIS  . BUNIONS, RIGHT FOOT  . URINARY INCONTINENCE, MIXED  . HYPERGLYCEMIA  . Tinea corporis  . Abrasion of forearm  . Hemorrhoids, internal, with bleeding   Past Medical History  Diagnosis Date  . Allergic rhinitis, cause unspecified   . Acute bronchitis   . Bunion   . Deviated nasal septum   . Routine general medical examination at a health care facility   . External hemorrhoids without mention of complication   . Herpes simplex without mention of complication   . Other abnormal glucose   . Unspecified essential hypertension   . Hypopotassemia   . Leukocytosis, unspecified   . Other screening mammogram   . Predominant disturbance of emotions   . 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 Partial hysterectomy   . Endometrial ablation   . Colonoscopy  01/2000  . Dexa 12/2005    Normal  . Ls film 12/07    Mod OA Changes; Deg. changes LS-5  . Colonoscopy 03/2006    Negative   History  Substance Use Topics  . Smoking status: Never Smoker   . Smokeless tobacco: Not on file  . Alcohol Use: No   Family History  Problem Relation Age of Onset  . Hypertension Father   . Coronary artery disease Father     with CABG  . Hypertension Mother    Allergies  Allergen Reactions  . Amlodipine Besylate     REACTION: didn't work  . Moexipril-Hydrochlorothiazide     REACTION: felt bad  . Olmesartan Medoxomil     REACTION: fatigue and tingling  . Spironolactone     REACTION: diarrhea/ did not work  . Valsartan     REACTION: headache   Current Outpatient Prescriptions on File Prior to Visit  Medication Sig Dispense Refill  . amLODipine-benazepril (LOTREL) 5-20 MG per capsule TAKE 1 CAPSULE BY MOUTH EVERY DAY  30 capsule  6  . Multiple Vitamin (MULTIVITAMIN) tablet Take 1 tablet by mouth daily.        . Omega-3 Fatty Acids (FISH OIL CONCENTRATE PO) Take 1 capsule by mouth daily.       Marland Kitchen ascorbic  acid (VITAMIN C) 500 MG tablet Take 500 mg by mouth daily.        . clotrimazole (LOTRIMIN) 1 % cream Apply topically 2 (two) times daily.  30 g  0  . hydrocortisone (ANUSOL-HC) 2.5 % rectal cream Apply rectally 2 times daily as needed for hemorrhoid discomfort  30 g  1  . hydrocortisone (ANUSOL-HC) 25 MG suppository Place 1 suppository (25 mg total) rectally at bedtime as needed for hemorrhoids.  12 suppository  1  . KLOR-CON 10 10 MEQ CR tablet TAKE 1 TABLET BY MOUTH EVERY DAY  30 tablet  6        Review of Systems Review of Systems  Constitutional: Negative for fever, appetite change, fatigue and unexpected weight change.  Eyes: Negative for pain and visual disturbance.  Respiratory: Negative for cough and shortness of breath.   Cardiovascular: Negative for cp or palpitations   no edema Gastrointestinal: Negative for nausea, diarrhea and  constipation. pos for blood in stool and rectal pain and itching  Genitourinary: Negative for urgency and frequency.  Skin: Negative for pallor or rash   MSK no muscle cramps or joint changes  Neurological: Negative for weakness, light-headedness, numbness and headaches.  Hematological: Negative for adenopathy. Does not bruise/bleed easily.  Psychiatric/Behavioral: Negative for dysphoric mood. The patient is not nervous/anxious.         Objective:   Physical Exam  Constitutional: She appears well-developed and well-nourished. No distress.       overwt and well appearing   HENT:  Head: Normocephalic and atraumatic.  Mouth/Throat: Oropharynx is clear and moist.  Eyes: Conjunctivae and EOM are normal. Pupils are equal, round, and reactive to light. No scleral icterus.  Neck: Normal range of motion. Neck supple. No JVD present. Carotid bruit is not present. No thyromegaly present.  Cardiovascular: Normal rate, regular rhythm, normal heart sounds and intact distal pulses.   Pulmonary/Chest: Effort normal and breath sounds normal. No respiratory distress. She has no wheezes.  Abdominal: Soft. Bowel sounds are normal. She exhibits no distension, no abdominal bruit and no mass. There is no tenderness.  Musculoskeletal: Normal range of motion. She exhibits no edema and no tenderness.  Lymphadenopathy:    She has no cervical adenopathy.  Neurological: She has normal reflexes. No cranial nerve deficit. Coordination normal.  Skin: Skin is warm and dry. No rash noted. No erythema. No pallor.  Psychiatric: She has a normal mood and affect.          Assessment & Plan:

## 2010-10-23 ENCOUNTER — Telehealth: Payer: Self-pay

## 2010-10-23 NOTE — Telephone Encounter (Signed)
Pt called triage went to the gym today and burning problem re hemorrhoids, after had BM had rectal bleeding with BM. Pt wants to know if should come in or what to do. Dr Milinda Antis said if she feels OK it is all right to wait on first available appt but if dizziness or abdominal pain or a lot of bleeding pt should be seen today. Dr Milinda Antis is in Sat [facility] if needed. I tried to call pt [phone] and left v/m for pt to call back.

## 2010-10-23 NOTE — Telephone Encounter (Signed)
Patient notified as instructed by telephone. Pt said no abdominal pain, no dizziness, and bleeding stopped after BM. Pt said the burning and itching bothers her the most. Pt said she would go to ER if Bleeding or abdominal pain or dizziness tonight. Loistine Simas is scheduling Sat [facility] appt with Dr Milinda Antis.

## 2010-10-24 ENCOUNTER — Ambulatory Visit: Payer: BC Managed Care – PPO | Admitting: Family Medicine

## 2010-11-17 ENCOUNTER — Ambulatory Visit: Payer: BC Managed Care – PPO | Admitting: Gastroenterology

## 2010-12-11 ENCOUNTER — Telehealth: Payer: Self-pay | Admitting: Gastroenterology

## 2010-12-11 NOTE — Telephone Encounter (Signed)
Message copied by JOHNSON, [place] J on Fri Dec 11, 2010  1:40 PM ------      Message from: Tedra [place] A      Created: Tue Nov 17, 2010  3:28 PM       Per Dr. Jarold Motto, No Show patient and charge.

## 2010-12-17 ENCOUNTER — Ambulatory Visit: Payer: BC Managed Care – PPO | Admitting: Family Medicine

## 2010-12-18 ENCOUNTER — Ambulatory Visit: Payer: BC Managed Care – PPO | Admitting: Family Medicine

## 2010-12-28 ENCOUNTER — Encounter: Payer: Self-pay | Admitting: Family Medicine

## 2010-12-28 ENCOUNTER — Encounter: Payer: Self-pay | Admitting: Gastroenterology

## 2010-12-28 ENCOUNTER — Ambulatory Visit (INDEPENDENT_AMBULATORY_CARE_PROVIDER_SITE_OTHER): Payer: BC Managed Care – PPO | Admitting: Family Medicine

## 2010-12-28 VITALS — BP 110/74 | HR 68 | Temp 97.6°F | Ht 67.0 in | Wt 203.2 lb

## 2010-12-28 DIAGNOSIS — K648 Other hemorrhoids: Secondary | ICD-10-CM

## 2010-12-28 DIAGNOSIS — M545 Low back pain, unspecified: Secondary | ICD-10-CM

## 2010-12-28 DIAGNOSIS — M79604 Pain in right leg: Secondary | ICD-10-CM

## 2010-12-28 MED ORDER — VALACYCLOVIR HCL 500 MG PO TABS: 500.0000 mg | ORAL_TABLET | Freq: Two times a day (BID) | ORAL | Status: AC

## 2010-12-28 NOTE — Patient Instructions (Addendum)
We will do GI referral at check out  Use aleve -up to 2 pills twice daily with meals (stop it if any GI upset )  In 1-2 weeks if not improved - let me know  Also heat /ice back for 10 minutes at a time may be helpful  I sent valtrex to your pharmacy

## 2010-12-28 NOTE — Assessment & Plan Note (Signed)
Pt had to cancel last appt due to lack of funds - and now wishes to re schedule  Still having rectal bleeding - exp with exercise as well as some pain  Ref done

## 2010-12-28 NOTE — Assessment & Plan Note (Signed)
With radiculopathy to R leg Mild with no neuro findings today Will tx carefully with aleve for 1-2 wk and update if not imp  Consider return to ortho if not imp

## 2010-12-28 NOTE — Progress Notes (Signed)
Subjective:    Patient ID: Rebecca [name], female    DOB: 09/13/1955, 55 y.o.   MRN: [id number]  HPI Here for lower R leg pain that comes and goes  No pain today  Pain over the shin area - then radiates up  Not tender to touch No swelling or tenderness or warmth  Does have issues with back - Deg disk dz -- that bothers her a lot -- R lower back - a little in buttock area  Sometimes leg feels a little tingly  No weakness at all or foot drop  Feels best to elevate the leg  Foot is not affected   Does not take any med Bought aleve - did not take   Also - missed her GI appt , for hemorrhoids - so needs to re schedule She had to cancel - did not have the money  She has brb per rectum and at some times pain Also fam hx of colon cancer - wants to get checked out   Patient Active Problem List  Diagnoses  . HSV  . HYPOKALEMIA  . LEUKOCYTOSIS UNSPECIFIED  . STRESS REACTION, ACUTE, WITH EMOTIONAL DISTURBANCE  . HYPERTENSION  . HEMORRHOIDS, EXTERNAL  . DEVIATED NASAL SEPTUM  . ALLERGIC RHINITIS  . BUNIONS, RIGHT FOOT  . URINARY INCONTINENCE, MIXED  . HYPERGLYCEMIA  . Tinea corporis  . Abrasion of forearm  . Hemorrhoids, internal, with bleeding   Past Medical History  Diagnosis Date  . Allergic rhinitis, cause unspecified   . Acute bronchitis   . Bunion   . Deviated nasal septum   . External hemorrhoids without mention of complication   . Herpes simplex without mention of complication   . Other abnormal glucose   . Unspecified essential hypertension   . Hypopotassemia   . Leukocytosis, unspecified   . Other screening mammogram   . Predominant disturbance of emotions   . 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 Partial hysterectomy   . Endometrial ablation   . Colonoscopy 01/2000  . Dexa 12/2005    Normal  . Ls film 12/07    Mod OA Changes; Deg. changes LS-5  . Colonoscopy 03/2006    Negative   History    Substance Use Topics  . Smoking status: Never Smoker   . Smokeless tobacco: Not on file  . Alcohol Use: No   Family History  Problem Relation Age of Onset  . Hypertension Father   . Coronary artery disease Father     with CABG  . Hypertension Mother    Allergies  Allergen Reactions  . Amlodipine Besylate     REACTION: didn't work  . Moexipril-Hydrochlorothiazide     REACTION: felt bad  . Olmesartan Medoxomil     REACTION: fatigue and tingling  . Spironolactone     REACTION: diarrhea/ did not work  . Valsartan     REACTION: headache   Current Outpatient Prescriptions on File Prior to Visit  Medication Sig Dispense Refill  . amLODipine-benazepril (LOTREL) 5-20 MG per capsule TAKE 1 CAPSULE BY MOUTH EVERY DAY  30 capsule  6  . ascorbic acid (VITAMIN C) 500 MG tablet Take 500 mg by mouth daily.        . clotrimazole (LOTRIMIN) 1 % cream Apply topically 2 (two) times daily.  30 g  0  . hydrochlorothiazide (HYDRODIURIL) 50 MG tablet Take 25 mg by mouth daily.        Marland Kitchen  hydrocortisone (ANUSOL-HC) 2.5 % rectal cream Apply rectally 2 times daily as needed for hemorrhoid discomfort  30 g  1  . hydrocortisone (ANUSOL-HC) 25 MG suppository Place 1 suppository (25 mg total) rectally at bedtime as needed for hemorrhoids.  12 suppository  1  . KLOR-CON 10 10 MEQ CR tablet TAKE 1 TABLET BY MOUTH EVERY DAY  30 tablet  6  . Multiple Vitamin (MULTIVITAMIN) tablet Take 1 tablet by mouth daily.        . Omega-3 Fatty Acids (FISH OIL CONCENTRATE PO) Take 1 capsule by mouth daily.            Review of Systems Review of Systems  Constitutional: Negative for fever, appetite change, fatigue and unexpected weight change.  Eyes: Negative for pain and visual disturbance.  Respiratory: Negative for cough and shortness of breath.   Cardiovascular: Negative for cp or palpitations    Gastrointestinal: Negative for nausea, diarrhea and constipation.  Genitourinary: Negative for urgency and frequency.   Skin: Negative for pallor or rash  MSK pos for low back pain and leg pain   Neurological: Negative for weakness, light-headedness, numbness (pos for tingling) and headaches.  Hematological: Negative for adenopathy. Does not bruise/bleed easily.  Psychiatric/Behavioral: Negative for dysphoric mood. The patient is not nervous/anxious.          Objective:   Physical Exam  Constitutional: She appears well-developed and well-nourished. No distress.  HENT:  Head: Normocephalic and atraumatic.  Mouth/Throat: Oropharynx is clear and moist.  Eyes: Conjunctivae and EOM are normal. Pupils are equal, round, and reactive to light. No scleral icterus.  Neck: Normal range of motion. Neck supple. No JVD present. Carotid bruit is not present.  Cardiovascular: Normal rate, regular rhythm and normal heart sounds.  Exam reveals no gallop.   Pulmonary/Chest: Effort normal and breath sounds normal. No respiratory distress. She has no wheezes.  Abdominal: Soft. Bowel sounds are normal. She exhibits no distension and no abdominal bruit. There is no tenderness.  Musculoskeletal: Normal range of motion. She exhibits tenderness. She exhibits no edema.       Lumbar back: She exhibits tenderness and spasm. She exhibits no bony tenderness and no swelling.       Tender in R buttock and R peri lumbar musculature  No bony tenderness SLR pos for R buttock (but not leg) pain  Nl rom hip  Nl rom spine - some pain on full ext Nl gait   R leg nontender No swelling/ redness/ tenderness or palp cords   Lymphadenopathy:    She has no cervical adenopathy.  Neurological: She is alert. She has normal strength and normal reflexes. She displays no atrophy and no tremor. No cranial nerve deficit or sensory deficit. She exhibits normal muscle tone. Coordination and gait normal.  Skin: Skin is warm and dry. No rash noted. No erythema. No pallor.  Psychiatric: She has a normal mood and affect.          Assessment & Plan:

## 2011-01-14 ENCOUNTER — Encounter: Payer: Self-pay | Admitting: *Deleted

## 2011-01-15 ENCOUNTER — Ambulatory Visit: Payer: BC Managed Care – PPO | Admitting: Gastroenterology

## 2011-01-15 ENCOUNTER — Encounter: Payer: Self-pay | Admitting: Gastroenterology

## 2011-03-16 ENCOUNTER — Other Ambulatory Visit: Payer: Self-pay | Admitting: Family Medicine

## 2011-03-16 DIAGNOSIS — Z1231 Encounter for screening mammogram for malignant neoplasm of breast: Secondary | ICD-10-CM

## 2011-03-29 ENCOUNTER — Encounter: Payer: Self-pay | Admitting: Gastroenterology

## 2011-04-05 ENCOUNTER — Other Ambulatory Visit: Payer: Self-pay | Admitting: Family Medicine

## 2011-04-06 ENCOUNTER — Ambulatory Visit: Payer: BC Managed Care – PPO | Admitting: Gastroenterology

## 2011-04-08 ENCOUNTER — Ambulatory Visit (HOSPITAL_COMMUNITY)
Admission: RE | Admit: 2011-04-08 | Discharge: 2011-04-08 | Disposition: A | Discharge status: Home / Self Care | Payer: BC Managed Care – PPO | Source: Ambulatory Visit | Attending: Family Medicine | Admitting: Family Medicine

## 2011-04-08 DIAGNOSIS — Z1231 Encounter for screening mammogram for malignant neoplasm of breast: Secondary | ICD-10-CM

## 2011-04-09 ENCOUNTER — Encounter: Payer: Self-pay | Admitting: *Deleted

## 2011-04-14 ENCOUNTER — Encounter: Payer: Self-pay | Admitting: Family Medicine

## 2011-04-14 ENCOUNTER — Ambulatory Visit (INDEPENDENT_AMBULATORY_CARE_PROVIDER_SITE_OTHER): Payer: BC Managed Care – PPO | Admitting: Family Medicine

## 2011-04-14 VITALS — BP 112/72 | HR 82 | Temp 97.8°F | Ht 68.0 in | Wt 206.8 lb

## 2011-04-14 DIAGNOSIS — N3946 Mixed incontinence: Secondary | ICD-10-CM | POA: Insufficient documentation

## 2011-04-14 DIAGNOSIS — R300 Dysuria: Secondary | ICD-10-CM

## 2011-04-14 DIAGNOSIS — R3915 Urgency of urination: Secondary | ICD-10-CM

## 2011-04-14 LAB — POCT URINALYSIS DIPSTICK
Bilirubin, UA: NEGATIVE
Glucose, UA: NEGATIVE
Ketones, UA: NEGATIVE
Leukocytes, UA: NEGATIVE
Nitrite, UA: NEGATIVE
Protein, UA: NEGATIVE
Spec Grav, UA: 1.01
Urobilinogen, UA: 0.2
pH, UA: 5

## 2011-04-14 LAB — POCT UA - MICROSCOPIC ONLY
Bacteria, U Microscopic: 0
Casts, Ur, LPF, POC: 0
Mucus, UA: 0
RBC, urine, microscopic: 0
WBC, Ur, HPF, POC: 0
Yeast, UA: 0

## 2011-04-14 MED ORDER — SOLIFENACIN SUCCINATE 5 MG PO TABS: 10.0000 mg | ORAL_TABLET | Freq: Every day | ORAL | Status: DC

## 2011-04-14 NOTE — Progress Notes (Signed)
Subjective:    Patient ID: Rebecca [name], female    DOB: October 20, 1955, 56 y.o.   MRN: [id number]  HPI Here with urinary symptoms Much urgency- has to go quickly occ a little low back pain  A little odor- ? That may be vaginal inst of odor   No fever or n/v No dysuria    Does have incontinence when she cannot get to bathroom in time Also when she coughs/ sneezes/ laughs Has had hysterect Urgency has been happening more for a while now  Does get up to urinate at night also  Patient Active Problem List  Diagnoses  . HSV  . HYPOKALEMIA  . LEUKOCYTOSIS UNSPECIFIED  . STRESS REACTION, ACUTE, WITH EMOTIONAL DISTURBANCE  . HYPERTENSION  . HEMORRHOIDS, EXTERNAL  . DEVIATED NASAL SEPTUM  . ALLERGIC RHINITIS  . BUNIONS, RIGHT FOOT  . URINARY INCONTINENCE, MIXED  . HYPERGLYCEMIA  . Tinea corporis  . Abrasion of forearm  . Hemorrhoids, internal, with bleeding  . Low back pain radiating to right leg  . Urinary urgency  . Mixed incontinence urge and stress   Past Medical History  Diagnosis Date  . Allergic rhinitis, cause unspecified   . Acute bronchitis   . Bunion   . Deviated nasal septum   . External hemorrhoids without mention of complication   . Herpes simplex without mention of complication   . Other abnormal glucose   . Unspecified essential hypertension   . Hypopotassemia   . Leukocytosis, unspecified   . Other screening mammogram   . Predominant disturbance of emotions   . 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 Partial hysterectomy   . Endometrial ablation   . Dexa 12/2005    Normal  . Ls film 12/07    Mod OA Changes; Deg. changes LS-5  . Colonoscopy 03/2006    Negative   History  Substance Use Topics  . Smoking status: Never Smoker   . Smokeless tobacco: Not on file  . Alcohol Use: No   Family History  Problem Relation Age of Onset  . Hypertension Father   . Coronary artery disease Father    with CABG  . Hypertension Mother    Allergies  Allergen Reactions  . Amlodipine Besylate     REACTION: didn't work  . Moexipril-Hydrochlorothiazide     REACTION: felt bad  . Olmesartan Medoxomil     REACTION: fatigue and tingling  . Spironolactone     REACTION: diarrhea/ did not work  . Valsartan     REACTION: headache   Current Outpatient Prescriptions on File Prior to Visit  Medication Sig Dispense Refill  . amLODipine-benazepril (LOTREL) 5-20 MG per capsule TAKE 1 CAPSULE BY MOUTH EVERY DAY  30 capsule  0  . clotrimazole (LOTRIMIN) 1 % cream Apply topically 2 (two) times daily.  30 g  0  . hydrochlorothiazide (HYDRODIURIL) 50 MG tablet TAKE 1 TABLET BY MOUTH EVERY DAY  30 tablet  0  . hydrocortisone (ANUSOL-HC) 2.5 % rectal cream Apply rectally 2 times daily as needed for hemorrhoid discomfort  30 g  1  . hydrocortisone (ANUSOL-HC) 25 MG suppository Place 1 suppository (25 mg total) rectally at bedtime as needed for hemorrhoids.  12 suppository  1  . Multiple Vitamin (MULTIVITAMIN) tablet Take 1 tablet by mouth daily.        . Omega-3 Fatty Acids (FISH OIL CONCENTRATE PO) Take 1 capsule by mouth daily.       Marland Kitchen  valACYclovir (VALTREX) 500 MG tablet Take 1 tablet (500 mg total) by mouth 2 (two) times daily. For 1 week as needed for outbreaks  14 tablet  5  . solifenacin (VESICARE) 5 MG tablet Take 2 tablets (10 mg total) by mouth daily.  30 tablet  11       Review of Systems Review of Systems  Constitutional: Negative for fever, appetite change, fatigue and unexpected weight change.  Eyes: Negative for pain and visual disturbance.  Respiratory: Negative for cough and shortness of breath.   Cardiovascular: Negative for cp or palpitations    Gastrointestinal: Negative for nausea, diarrhea and constipation.  Genitourinary: pos for urgency and frequency, neg for blood in urine/ back pain / fever/ nausea  Skin: Negative for pallor or rash   Neurological: Negative for weakness,  light-headedness, numbness and headaches.  Hematological: Negative for adenopathy. Does not bruise/bleed easily.  Psychiatric/Behavioral: Negative for dysphoric mood. The patient is not nervous/anxious.          Objective:   Physical Exam  Constitutional: She appears well-developed and well-nourished. No distress.       overwt and well appearing   HENT:  Head: Normocephalic and atraumatic.  Mouth/Throat: Oropharynx is clear and moist.  Eyes: Conjunctivae and EOM are normal. Pupils are equal, round, and reactive to light. No scleral icterus.  Neck: Normal range of motion. Neck supple.  Cardiovascular: Normal rate and regular rhythm.   Pulmonary/Chest: Effort normal and breath sounds normal.  Abdominal: Soft. Bowel sounds are normal. She exhibits no distension and no mass. There is no tenderness.       No suprapubic tenderness   No cva tenderness   Musculoskeletal: She exhibits no edema.       No cva tenderness   Lymphadenopathy:    She has no cervical adenopathy.  Neurological: She is alert. She has normal reflexes.  Skin: Skin is dry.  Psychiatric: She has a normal mood and affect.          Assessment & Plan:

## 2011-04-14 NOTE — Patient Instructions (Addendum)
Try vesicare daily for mixed incontinence (stress and urge)  Try to empty bladder regularly Remember the " freeze and squeeze" maneuver Drink water/ avoid other beverages If side effects are to bothersome- stop it and call  Follow up in about 2 months

## 2011-04-14 NOTE — Assessment & Plan Note (Signed)
With neg ua  Will try vesicare 5 Also disc opt of urology visit/ further work up or PT  Disc kegel and "freeze and squeeze" technique to prev incontinence F/u 2 mo  Disc poss side eff- will keep me updated

## 2011-04-26 ENCOUNTER — Ambulatory Visit: Payer: BC Managed Care – PPO | Admitting: Gastroenterology

## 2011-05-03 ENCOUNTER — Other Ambulatory Visit: Payer: Self-pay | Admitting: Family Medicine

## 2011-05-26 ENCOUNTER — Encounter: Payer: Self-pay | Admitting: Gastroenterology

## 2011-06-22 ENCOUNTER — Ambulatory Visit: Payer: BC Managed Care – PPO | Admitting: Gastroenterology

## 2011-09-05 ENCOUNTER — Telehealth: Payer: Self-pay | Admitting: Family Medicine

## 2011-09-05 DIAGNOSIS — R7309 Other abnormal glucose: Secondary | ICD-10-CM

## 2011-09-05 DIAGNOSIS — Z0000 Encounter for general adult medical examination without abnormal findings: Secondary | ICD-10-CM

## 2011-09-05 NOTE — Telephone Encounter (Signed)
Message copied by Judy Pimple on Sun Sep 05, 2011 12:58 PM ------      Message from: Rebecca [name]      Created: Mon Aug 30, 2011  9:46 AM      Regarding: Cpx labs Tues 9/3       Please order  future cpx labs for pt's upcomming lab appt.      Thanks      Rodney Booze

## 2011-09-07 ENCOUNTER — Other Ambulatory Visit (INDEPENDENT_AMBULATORY_CARE_PROVIDER_SITE_OTHER): Payer: BC Managed Care – PPO

## 2011-09-07 DIAGNOSIS — Z0000 Encounter for general adult medical examination without abnormal findings: Secondary | ICD-10-CM

## 2011-09-07 DIAGNOSIS — R7309 Other abnormal glucose: Secondary | ICD-10-CM

## 2011-09-07 LAB — CBC WITH DIFFERENTIAL/PLATELET
Basophils Absolute: 0 10*3/uL (ref 0.0–0.1)
Basophils Relative: 0.6 % (ref 0.0–3.0)
Eosinophils Absolute: 0.1 10*3/uL (ref 0.0–0.7)
Eosinophils Relative: 2.8 % (ref 0.0–5.0)
HCT: 39.7 % (ref 36.0–46.0)
Hemoglobin: 13.4 g/dL (ref 12.0–15.0)
Lymphocytes Relative: 46.7 % — ABNORMAL HIGH (ref 12.0–46.0)
Lymphs Abs: 1.6 10*3/uL (ref 0.7–4.0)
MCHC: 33.7 g/dL (ref 30.0–36.0)
MCV: 93.1 fl (ref 78.0–100.0)
Monocytes Absolute: 0.3 10*3/uL (ref 0.1–1.0)
Monocytes Relative: 10.1 % (ref 3.0–12.0)
Neutro Abs: 1.4 10*3/uL (ref 1.4–7.7)
Neutrophils Relative %: 39.8 % — ABNORMAL LOW (ref 43.0–77.0)
Platelets: 279 10*3/uL (ref 150.0–400.0)
RBC: 4.26 Mil/uL (ref 3.87–5.11)
RDW: 12.9 % (ref 11.5–14.6)
WBC: 3.4 10*3/uL — ABNORMAL LOW (ref 4.5–10.5)

## 2011-09-07 LAB — HEMOGLOBIN A1C: Hgb A1c MFr Bld: 6 % (ref 4.6–6.5)

## 2011-09-07 LAB — LIPID PANEL
Cholesterol: 188 mg/dL (ref 0–200)
HDL: 64.7 mg/dL (ref >39.00)
LDL Cholesterol: 114 mg/dL — ABNORMAL HIGH (ref 0–99)
Total CHOL/HDL Ratio: 3
Triglycerides: 48 mg/dL (ref 0.0–149.0)
VLDL: 9.6 mg/dL (ref 0.0–40.0)

## 2011-09-07 LAB — COMPREHENSIVE METABOLIC PANEL
ALT: 16 U/L (ref 0–35)
AST: 20 U/L (ref 0–37)
Albumin: 4.1 g/dL (ref 3.5–5.2)
Alkaline Phosphatase: 50 U/L (ref 39–117)
BUN: 21 mg/dL (ref 6–23)
CO2: 29 mEq/L (ref 19–32)
Calcium: 9.3 mg/dL (ref 8.4–10.5)
Chloride: 103 mEq/L (ref 96–112)
Creatinine, Ser: 0.9 mg/dL (ref 0.4–1.2)
GFR: 87.05 mL/min (ref >60.00)
Glucose, Bld: 104 mg/dL — ABNORMAL HIGH (ref 70–99)
Potassium: 3.8 mEq/L (ref 3.5–5.1)
Sodium: 141 mEq/L (ref 135–145)
Total Bilirubin: 0.3 mg/dL (ref 0.3–1.2)
Total Protein: 7.2 g/dL (ref 6.0–8.3)

## 2011-09-07 LAB — TSH: TSH: 1.6 u[IU]/mL (ref 0.35–5.50)

## 2011-09-14 ENCOUNTER — Ambulatory Visit (INDEPENDENT_AMBULATORY_CARE_PROVIDER_SITE_OTHER): Payer: BC Managed Care – PPO | Admitting: Family Medicine

## 2011-09-14 ENCOUNTER — Encounter: Payer: Self-pay | Admitting: Family Medicine

## 2011-09-14 VITALS — BP 118/64 | HR 71 | Temp 97.8°F | Ht 68.0 in | Wt 202.0 lb

## 2011-09-14 DIAGNOSIS — E669 Obesity, unspecified: Secondary | ICD-10-CM

## 2011-09-14 DIAGNOSIS — Z800 Family history of malignant neoplasm of digestive organs: Secondary | ICD-10-CM

## 2011-09-14 DIAGNOSIS — R7309 Other abnormal glucose: Secondary | ICD-10-CM

## 2011-09-14 DIAGNOSIS — I10 Essential (primary) hypertension: Secondary | ICD-10-CM

## 2011-09-14 DIAGNOSIS — J0100 Acute maxillary sinusitis, unspecified: Secondary | ICD-10-CM

## 2011-09-14 DIAGNOSIS — Z0000 Encounter for general adult medical examination without abnormal findings: Secondary | ICD-10-CM

## 2011-09-14 MED ORDER — AMLODIPINE BESY-BENAZEPRIL HCL 5-20 MG PO CAPS: 1.0000 | ORAL_CAPSULE | Freq: Every day | ORAL | Status: DC

## 2011-09-14 MED ORDER — HYDROCHLOROTHIAZIDE 50 MG PO TABS: 50.0000 mg | ORAL_TABLET | Freq: Every day | ORAL | Status: DC

## 2011-09-14 MED ORDER — AMOXICILLIN-POT CLAVULANATE 875-125 MG PO TABS: 1.0000 | ORAL_TABLET | Freq: Two times a day (BID) | ORAL | Status: DC

## 2011-09-14 NOTE — Assessment & Plan Note (Signed)
Ref for colonosc- due for 5 y recall for family hx No bowel c/o or changes

## 2011-09-14 NOTE — Assessment & Plan Note (Signed)
With general malaise and pain in R maxillary area with congestion times one mo tx with augmentin Will update if not imp  Disc sympt care

## 2011-09-14 NOTE — Progress Notes (Signed)
Subjective:    Patient ID: Rebecca [name], female    DOB: [birth year], 56 y.o.   MRN: [id number]  HPI Here for health maintenance exam and to review chronic medical problems    Doing fairly  Has tight feeling hands - ? Carpal tunnel Back bothers her  ? Sinus problem    Wt is down 4 lb with bmi of 30 Obesity--is eating better  Moved this summer- very busy , but actually not going to the gym as much- is farther  Has some equipt at home (incontinence is an issue)  bp is stable today  No cp or palpitations or headaches or edema  No side effects to medicines  BP Readings from Last 3 Encounters:  09/14/11 118/64  04/14/11 112/72  12/28/10 110/74      Partial hyst in past  No hx of abn paps   Flu shot- declines   mammo 4/13 Self exam- no lumps or changes   colonosc nl 3/08-mother had colon cancer  Is due for colonosc  Set up appts and kept cancelling - could not afford copay, wants to get scheduled now   dexa nl 12/07  Lab Results  Component Value Date   CHOL 188 09/07/2011   CHOL 203* 08/11/2009   CHOL 200 03/04/2008   Lab Results  Component Value Date   HDL 64.70 09/07/2011   HDL 78.30 08/11/2009   HDL 70.0 03/04/2008   Lab Results  Component Value Date   LDLCALC 114* 09/07/2011   LDLCALC 124* 03/04/2008   LDLCALC 117* 12/15/2006   Lab Results  Component Value Date   TRIG 48.0 09/07/2011   TRIG 100.0 08/11/2009   TRIG 30 03/04/2008   Lab Results  Component Value Date   CHOLHDL 3 09/07/2011   CHOLHDL 3 08/11/2009   CHOLHDL 2.9 CALC 03/04/2008   Lab Results  Component Value Date   LDLDIRECT 107.3 08/11/2009   cholesterol is pretty good overall Watches diet    Hyperglycemia - a1c is down from 6.3 to 6.0 This is better    Thinks she has a sinus infection Feels sick and achey - ? Temp Bad breath  Most sinus pain is on the right - but disch is clear , occ blood  It is allergy season  Has had symptoms for 1 month - takes zyrtec  Patient Active Problem List  Diagnosis    . HSV  . HYPOKALEMIA  . LEUKOCYTOSIS UNSPECIFIED  . STRESS REACTION, ACUTE, WITH EMOTIONAL DISTURBANCE  . HYPERTENSION  . HEMORRHOIDS, EXTERNAL  . DEVIATED NASAL SEPTUM  . ALLERGIC RHINITIS  . BUNIONS, RIGHT FOOT  . URINARY INCONTINENCE, MIXED  . HYPERGLYCEMIA  . Tinea corporis  . Abrasion of forearm  . Hemorrhoids, internal, with bleeding  . Low back pain radiating to right leg  . Urinary urgency  . Mixed incontinence urge and stress  . Routine general medical examination at a health care facility  . Obesity   Past Medical History  Diagnosis Date  . Allergic rhinitis, cause unspecified   . Acute bronchitis   . Bunion   . Deviated nasal septum   . External hemorrhoids without mention of complication   . Herpes simplex without mention of complication   . Other abnormal glucose   . Unspecified essential hypertension   . Hypopotassemia   . Leukocytosis, unspecified   . Other screening mammogram   . Predominant disturbance of emotions   . Mixed incontinence urge and stress (female)(female)    Past Surgical  History  Procedure Date  . Partial hysterectomy   . Endometrial ablation   . Dexa 12/2005    Normal  . Ls film 12/07    Mod OA Changes; Deg. changes LS-5  . Colonoscopy 03/2006    Negative   History  Substance Use Topics  . Smoking status: Never Smoker   . Smokeless tobacco: Not on file  . Alcohol Use: No   Family History  Problem Relation Age of Onset  . Hypertension Father   . Coronary artery disease Father     with CABG  . Hypertension Mother    Allergies  Allergen Reactions  . Amlodipine Besylate     REACTION: didn't work  . Moexipril-Hydrochlorothiazide     REACTION: felt bad  . Olmesartan Medoxomil     REACTION: fatigue and tingling  . Spironolactone     REACTION: diarrhea/ did not work  . Valsartan     REACTION: headache   Current Outpatient Prescriptions on File Prior to Visit  Medication Sig Dispense Refill  . amLODipine-benazepril  (LOTREL) 5-20 MG per capsule TAKE 1 CAPSULE BY MOUTH EVERY DAY  30 capsule  12  . hydrochlorothiazide (HYDRODIURIL) 50 MG tablet TAKE 1 TABLET BY MOUTH EVERY DAY  30 tablet  12  . hydrocortisone (ANUSOL-HC) 25 MG suppository Place 1 suppository (25 mg total) rectally at bedtime as needed for hemorrhoids.  12 suppository  1  . Multiple Vitamin (MULTIVITAMIN) tablet Take 1 tablet by mouth daily.        . Omega-3 Fatty Acids (FISH OIL CONCENTRATE PO) Take 1 capsule by mouth daily.       . solifenacin (VESICARE) 5 MG tablet Take 2 tablets (10 mg total) by mouth daily.  30 tablet  11  . valACYclovir (VALTREX) 500 MG tablet Take 1 tablet (500 mg total) by mouth 2 (two) times daily. For 1 week as needed for outbreaks  14 tablet  5    Review of Systems Review of Systems  Constitutional: Negative for fever, appetite change, and unexpected weight change.  Eyes: Negative for pain and visual disturbance.  ENT pos for nasal cong/ sinus pain and st Respiratory: Negative for cough and shortness of breath.   Cardiovascular: Negative for cp or palpitations    Gastrointestinal: Negative for nausea, diarrhea and constipation.  Genitourinary: Negative for urgency and frequency.  Skin: Negative for pallor or rash  MSK pos for ongoing back pain and wrist/hand pain and tightness  Neurological: Negative for weakness, light-headedness, numbness and headaches.  Hematological: Negative for adenopathy. Does not bruise/bleed easily.  Psychiatric/Behavioral: Negative for dysphoric mood. The patient is not nervous/anxious.         Objective:   Physical Exam  Constitutional: She appears well-developed and well-nourished. No distress.       obese and well appearing   HENT:  Head: Normocephalic and atraumatic.  Right Ear: External ear normal.  Left Ear: External ear normal.  Mouth/Throat: Oropharynx is clear and moist. No oropharyngeal exudate.       Nares are injected and congested  Bilateral ethmoid and maxillary  sinus tenderness worse on the L  Eyes: Conjunctivae normal and EOM are normal. Pupils are equal, round, and reactive to light. No scleral icterus.  Neck: Normal range of motion. Neck supple. No JVD present. Carotid bruit is not present. No thyromegaly present.  Cardiovascular: Normal rate, regular rhythm, normal heart sounds and intact distal pulses.  Exam reveals no gallop.   Pulmonary/Chest: Effort normal and breath sounds  normal. No respiratory distress. She has no wheezes. She exhibits no tenderness.  Abdominal: Soft. Bowel sounds are normal. She exhibits no distension, no abdominal bruit and no mass. There is no tenderness.  Genitourinary: No breast swelling, tenderness, discharge or bleeding.       Breast exam: No mass, nodules, thickening, tenderness, bulging, retraction, inflamation, nipple discharge or skin changes noted.  No axillary or clavicular LA.  Chaperoned exam.    Musculoskeletal: She exhibits no edema and no tenderness.       Limited rom TS and LS due to pain today   Lymphadenopathy:    She has no cervical adenopathy.  Neurological: She is alert. She has normal reflexes. No cranial nerve deficit. She exhibits normal muscle tone. Coordination normal.       Nl grip bilaterally  Skin: Skin is warm and dry. No rash noted. No erythema. No pallor.  Psychiatric: She has a normal mood and affect.          Assessment & Plan:

## 2011-09-14 NOTE — Patient Instructions (Addendum)
We will refer you for a colonoscopy at check out  Keep working on low sugar/ low fat diet  Also get back to exercise Take augmentin for sinus infection and update if no improvement  Make appt with Dr Patsy Lager at check out for back pain and also hand pain

## 2011-09-14 NOTE — Assessment & Plan Note (Signed)
a1c is down to 6.0- enc her to keep working on wt loss/ low glycemic diet Disc opt for exercise

## 2011-09-14 NOTE — Assessment & Plan Note (Signed)
bp in fair control at this time  No changes needed  Disc lifstyle change with low sodium diet and exercise   Labs reviewed with pt today

## 2011-09-14 NOTE — Assessment & Plan Note (Signed)
Reviewed health habits including diet and exercise and skin cancer prevention Also reviewed health mt list, fam hx and immunizations  Rev wellness lab in detail 

## 2011-09-14 NOTE — Assessment & Plan Note (Signed)
Discussed how this problem influences overall health and the risks it imposes  Reviewed plan for weight loss with lower calorie diet (via better food choices and also portion control or program like weight watchers) and exercise building up to or more than 30 minutes 5 days per week including some aerobic activity    

## 2011-09-16 ENCOUNTER — Telehealth: Payer: Self-pay

## 2011-09-16 MED ORDER — DOXYCYCLINE HYCLATE 100 MG PO CAPS: 100.0000 mg | ORAL_CAPSULE | Freq: Two times a day (BID) | ORAL | Status: AC

## 2011-09-16 NOTE — Telephone Encounter (Signed)
Patient notified. She said she can't tolerate it because she has to go to work. She requests something different.

## 2011-09-16 NOTE — Telephone Encounter (Signed)
Stop augmentin.  May take doxycycline twice daily for 10 days.  Sent in.

## 2011-09-16 NOTE — Telephone Encounter (Signed)
Patient advised.

## 2011-09-16 NOTE — Telephone Encounter (Signed)
This is a common side effect, if not severe and she can ride it out- take the abx for a total of 7 days  If not - let me know

## 2011-09-16 NOTE — Telephone Encounter (Signed)
Pt seen 09/14/11; Augmentin is helping pt feel better but loose BMs started 09/15/11; pt had 4 loose BMs today, no N or V. [facility] [address]Please advise.

## 2011-09-20 ENCOUNTER — Ambulatory Visit: Payer: BC Managed Care – PPO | Admitting: Family Medicine

## 2011-09-28 ENCOUNTER — Ambulatory Visit (INDEPENDENT_AMBULATORY_CARE_PROVIDER_SITE_OTHER): Payer: BC Managed Care – PPO | Admitting: Family Medicine

## 2011-09-28 ENCOUNTER — Encounter: Payer: Self-pay | Admitting: Family Medicine

## 2011-09-28 VITALS — BP 126/84 | HR 68 | Temp 98.0°F | Wt 203.5 lb

## 2011-09-28 DIAGNOSIS — R159 Full incontinence of feces: Secondary | ICD-10-CM

## 2011-09-28 NOTE — Progress Notes (Signed)
  Subjective:    Patient ID: Rebecca [name], female    DOB: [birth year], 56 y.o.   MRN: [id number]  HPI CC: bowel issues.  Seen 2 wks ago by PCP with concern for sinusitis, treated with augmentin, then started having diarrhea and cramping so this was stopped.  Suggested try doxy (but actually didn't fill script).  Abd issues started improving then last week started having bowel incontinence episodes described as having stool prior to BM when wiping, sounds more like issue with leaking after BM, not necessarily any full incontinence episodes.  Diarrhea actually has resolved.  Last stool was yesterday afternoon, normal, formed.  Currently having 1 soft stool in AM and PM.  Holds BMs at work.  Continues to have some cramping with BMs as well as more gassiness.  Does feel urge to go.  Denies fevers/chills, rash or itching perianally, nausea/vomiting, constipation.  Denies back pain.  No bladder problems.  No numbness down legs.  Over weekend, doesn't have issues with bowel accidents.  Describes stool as dark brown, pasty.  Trouble fully cleaning after BM. No changes in diet. Eating good vegetables daily.  Drinking good amount of water.  Noticing light bleeding with wiping, h/o hemorrhoids.  Has used anusol HC and suppositories.  Normal colonoscopy 03/2006.  Has colonoscopy scheduled 10/23/2011 with Dr. Jarold Motto.  Past Medical History  Diagnosis Date  . Allergic rhinitis, cause unspecified   . Acute bronchitis   . Bunion   . Deviated nasal septum   . External hemorrhoids without mention of complication   . Herpes simplex without mention of complication   . Other abnormal glucose   . Unspecified essential hypertension   . Hypopotassemia   . Leukocytosis, unspecified   . Other screening mammogram   . Predominant disturbance of emotions   . 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 Partial hysterectomy   . Endometrial ablation   .  Dexa 12/2005    Normal  . Ls film 12/07    Mod OA Changes; Deg. changes LS-5  . Colonoscopy 03/2006    Negative    Review of Systems Per HPI    Objective:   Physical Exam  Nursing note and vitals reviewed. Constitutional: She appears well-developed and well-nourished. No distress.  Abdominal: Soft. Bowel sounds are normal. She exhibits no distension and no mass. There is no hepatosplenomegaly. There is no tenderness. There is no rebound, no guarding and no CVA tenderness.  Genitourinary: Rectal exam shows external hemorrhoid (noninflammed) and internal hemorrhoid. Rectal exam shows no fissure, no mass, no tenderness and anal tone normal.       Assessment & Plan:

## 2011-09-28 NOTE — Patient Instructions (Addendum)
Take probiotics to rebuild healthy bacteria in gut - Align or Actimel. Use wipes after BMs. Don't hold BMs at work. Update [place] if these things don't help improve symptoms. Good to see you today, call us with quesitons.

## 2011-09-28 NOTE — Assessment & Plan Note (Addendum)
More episodes of leaking after recent abx use.  Added augmentin to intolerance list per pt request. Recommend probiotic (actimel after recent abx use, or align) to recolonize gut, wipes during day, and most importantly not holding BMs in during work day. Update if not improved with this.

## 2011-10-19 ENCOUNTER — Encounter: Payer: Self-pay | Admitting: Gastroenterology

## 2011-10-19 ENCOUNTER — Ambulatory Visit (INDEPENDENT_AMBULATORY_CARE_PROVIDER_SITE_OTHER): Payer: BC Managed Care – PPO | Admitting: Gastroenterology

## 2011-10-19 VITALS — BP 136/90 | HR 80 | Ht 66.75 in | Wt 206.5 lb

## 2011-10-19 DIAGNOSIS — R197 Diarrhea, unspecified: Secondary | ICD-10-CM

## 2011-10-19 DIAGNOSIS — Z800 Family history of malignant neoplasm of digestive organs: Secondary | ICD-10-CM

## 2011-10-19 DIAGNOSIS — K625 Hemorrhage of anus and rectum: Secondary | ICD-10-CM

## 2011-10-19 DIAGNOSIS — K529 Noninfective gastroenteritis and colitis, unspecified: Secondary | ICD-10-CM

## 2011-10-19 MED ORDER — PEG-KCL-NACL-NASULF-NA ASC-C 100 G PO SOLR: 1.0000 | Freq: Once | ORAL | Status: DC

## 2011-10-19 NOTE — Progress Notes (Signed)
History of Present Illness:  This is a 55 year old [place] American female with a very strong family history of colon cancer. She's had loose stools for many years but recent worsening and some incontinency and exacerbation of her hemorrhoidal bleeding. Last colonoscopy was 5 years ago. She recently has had some increased gas and bloating after being on antibiotics, but denies watery or bloody diarrhea or systemic, upper GI or hepatobiliary complaints. She does use a lot of by mouth sorbitol and fructose in her diet, but denies lactose intolerance, anorexia or weight loss.  I have reviewed this patient's present history, medical and surgical past history, allergies and medications.     ROS: The remainder of the 10 point ROS is negative     Physical Exam: Blood pressure 130/90, pulse 80 and regular, and weight 206 pounds with a BMI of 32.59. General well developed well nourished patient in no acute distress, appearing their stated age Eyes PERRLA, no icterus, fundoscopic exam per opthamologist Skin no lesions noted Neck supple, no adenopathy, no thyroid enlargement, no tenderness Chest clear to percussion and auscultation Heart no significant murmurs, gallops or rubs noted Abdomen no hepatosplenomegaly masses or tenderness, BS normal.  Rectal inspection normal no fissures, or fistulae noted.  No masses or tenderness on digital exam. Stool guaiac negative. Extremities no acute joint lesions, edema, phlebitis or evidence of cellulitis. Neurologic patient oriented x 3, cranial nerves intact, no focal neurologic deficits noted. Psychological mental status normal and normal affect. Anoscopy: Visualization of the anal or rectal areas unremarkable without fissures or fistulae. Rectal exam shows no masses or tenderness with good rectal tone. Stool is guaiac-negative. Anoscopy exam of her for rectal quadrants shows no significant hemorrhoidal tissue, or other abnormalities.  Assessment and plan:  Diarrhea and fecal incontinency probably related to large intake of by mouth sorbitol with diet gum. She is due for followup colonoscopy exam because of her family history of colon cancer, and this is been scheduled her convenience. I've given her list of nonabsorbable carbohydrates to avoid her diet. Review of her laboratory tests otherwise were unremarkable without evidence of chronic anemia or malabsorption. Her symptoms certainly do not seem consistent with C. difficile infection.  Encounter Diagnosis  Name Primary?  . Family history of colon cancer Yes

## 2011-10-19 NOTE — Patient Instructions (Addendum)
You have been scheduled for a Colonoscopy, instructions have been provided. Prep has been sent to your pharmacy. Information on artificial sweeteners has been given to you. CC:  Rebecca Manns MD

## 2011-11-15 ENCOUNTER — Ambulatory Visit (AMBULATORY_SURGERY_CENTER): Payer: BC Managed Care – PPO | Admitting: Gastroenterology

## 2011-11-15 ENCOUNTER — Encounter: Payer: Self-pay | Admitting: Gastroenterology

## 2011-11-15 VITALS — BP 116/73 | HR 59 | Temp 98.2°F | Resp 15 | Ht 66.0 in | Wt 206.0 lb

## 2011-11-15 DIAGNOSIS — Z800 Family history of malignant neoplasm of digestive organs: Secondary | ICD-10-CM

## 2011-11-15 DIAGNOSIS — D126 Benign neoplasm of colon, unspecified: Secondary | ICD-10-CM

## 2011-11-15 DIAGNOSIS — R197 Diarrhea, unspecified: Secondary | ICD-10-CM

## 2011-11-15 DIAGNOSIS — K644 Residual hemorrhoidal skin tags: Secondary | ICD-10-CM

## 2011-11-15 DIAGNOSIS — K625 Hemorrhage of anus and rectum: Secondary | ICD-10-CM

## 2011-11-15 HISTORY — PX: COLONOSCOPY WITH PROPOFOL: SHX5780

## 2011-11-15 MED ORDER — SODIUM CHLORIDE 0.9 % IV SOLN: 500.0000 mL | INTRAVENOUS | Status: DC

## 2011-11-15 NOTE — Progress Notes (Signed)
Patient did not experience any of the following events: a burn prior to discharge; a fall within the facility; wrong site/side/patient/procedure/implant event; or a hospital transfer or hospital admission upon discharge from the facility. (G8907) Patient did not have preoperative order for IV antibiotic SSI prophylaxis. (G8918)  

## 2011-11-15 NOTE — Patient Instructions (Addendum)

## 2011-11-15 NOTE — Op Note (Signed)
[facility] [address] [place], [address]   COLONOSCOPY PROCEDURE REPORT  PATIENT: Rebecca [name], Rebecca [name]  MR#: [id number] BIRTHDATE: 1955/07/05 , 56  yrs. old GENDER: Female ENDOSCOPIST: Mardella Layman, MD, [facility] REFERRED BY: PROCEDURE DATE:  11/15/2011 PROCEDURE:   Colonoscopy with biopsy ASA CLASS:   Class II INDICATIONS:chronic diarrhea and patient's immediate family history of colon cancer. MEDICATIONS: propofol (Diprivan) 300mg  IV  DESCRIPTION OF PROCEDURE:   After the risks and benefits and of the procedure were explained, informed consent was obtained.  A digital rectal exam revealed no abnormalities of the rectum.    The LB [id number]-[id number]  endoscope was introduced through the anus and advanced to the cecum, which was identified by both the appendix and ileocecal valve .  The quality of the prep was excellent, using MoviPrep .  The instrument was then slowly withdrawn as the colon was fully examined.     COLON FINDINGS: A normal appearing cecum, ileocecal valve, and appendiceal orifice were identified.  The ascending, hepatic flexure, transverse, splenic flexure, descending, sigmoid colon and rectum appeared unremarkable.  No polyps or cancers were seen. RANDOM BIOPSIES DONE.Marland KitchenMarland KitchenR/O MICROSCOPIC/COLLAGENOUS COLITIS. Retroflexed views revealed no abnormalities.     The scope was then withdrawn from the patient and the procedure completed.  COMPLICATIONS: There were no complications. ENDOSCOPIC IMPRESSION: 1.   Normal colon .Marland KitchenNO POLYPS OR CANCER 2.   RANDOM BIOPSIES DONE.Marland KitchenMarland KitchenR/O MICROSCOPIC/COLLAGENOUS COLITIS...  RECOMMENDATIONS: 1.  Await biopsy results 2.  Given your significant family history of colon cancer, you should have a repeat colonoscopy in 5 years   REPEAT EXAM:  cc:  _______________________________ eSignedMardella Layman, MD, [facility] 11/15/2011 3:01 PM

## 2011-11-16 ENCOUNTER — Telehealth: Payer: Self-pay | Admitting: *Deleted

## 2011-11-16 NOTE — Telephone Encounter (Signed)
  Follow up Call-  Call back number 11/15/2011  Post procedure Call Back phone  # [phone]  Permission to leave phone message Yes     Patient questions:  Do you have a fever, pain , or abdominal swelling? no Pain Score  0 *  Have you tolerated food without any problems? yes  Have you been able to return to your normal activities? yes  Do you have any questions about your discharge instructions: Diet   no Medications  no Follow up visit  no  Do you have questions or concerns about your Care? no  Actions: * If pain score is 4 or above: No action needed, pain <4.

## 2011-11-19 ENCOUNTER — Encounter: Payer: Self-pay | Admitting: Gastroenterology

## 2011-11-30 ENCOUNTER — Other Ambulatory Visit: Payer: Self-pay | Admitting: Family Medicine

## 2011-11-30 NOTE — Telephone Encounter (Signed)
Please refil that once, thanks

## 2011-11-30 NOTE — Telephone Encounter (Signed)
Ok to refill? Rx not on med list

## 2012-02-19 ENCOUNTER — Other Ambulatory Visit: Payer: Self-pay

## 2012-03-27 ENCOUNTER — Encounter: Payer: Self-pay | Admitting: Family Medicine

## 2012-03-27 ENCOUNTER — Ambulatory Visit (INDEPENDENT_AMBULATORY_CARE_PROVIDER_SITE_OTHER): Payer: BC Managed Care – PPO | Admitting: Family Medicine

## 2012-03-27 ENCOUNTER — Ambulatory Visit (INDEPENDENT_AMBULATORY_CARE_PROVIDER_SITE_OTHER)
Admission: RE | Admit: 2012-03-27 | Discharge: 2012-03-27 | Disposition: A | Discharge status: Home / Self Care | Payer: BC Managed Care – PPO | Source: Ambulatory Visit | Attending: Family Medicine | Admitting: Family Medicine

## 2012-03-27 VITALS — BP 122/68 | HR 68 | Temp 98.6°F | Ht 66.75 in | Wt 210.2 lb

## 2012-03-27 DIAGNOSIS — M545 Low back pain, unspecified: Secondary | ICD-10-CM

## 2012-03-27 DIAGNOSIS — M79609 Pain in unspecified limb: Secondary | ICD-10-CM

## 2012-03-27 DIAGNOSIS — M79604 Pain in right leg: Secondary | ICD-10-CM

## 2012-03-27 DIAGNOSIS — M79669 Pain in unspecified lower leg: Secondary | ICD-10-CM | POA: Insufficient documentation

## 2012-03-27 NOTE — Progress Notes (Signed)
Subjective:    Patient ID: Rebecca [name], female    DOB: June 15, 1955, 57 y.o.   MRN: [id number]  HPI Here with right leg and foot pain  Worse when she is sitting - forward and driving Aleve helps a bit and she does elevate her feet    ? If back is involved  Has a big bunion  Worse pain is on the side of the calf  No varicose veins Back is ok right now -not a problem  A little numb feeling at times   No swelling or redness at all   No traveling or prolonged sitting Is up and down at work   She can get on the treadmill and it does not bother her at all   Patient Active Problem List  Diagnosis  . HSV  . HYPOKALEMIA  . LEUKOCYTOSIS UNSPECIFIED  . STRESS REACTION, ACUTE, WITH EMOTIONAL DISTURBANCE  . HYPERTENSION  . HEMORRHOIDS, EXTERNAL  . DEVIATED NASAL SEPTUM  . ALLERGIC RHINITIS  . BUNIONS, RIGHT FOOT  . URINARY INCONTINENCE, MIXED  . HYPERGLYCEMIA  . Hemorrhoids, internal, with bleeding  . Low back pain radiating to right leg  . Urinary urgency  . Mixed incontinence urge and stress  . Routine general medical examination at a health care facility  . Obesity  . Family history of colon cancer requiring screening colonoscopy  . Sinusitis, acute maxillary  . Bowel incontinence   Past Medical History  Diagnosis Date  . Allergic rhinitis, cause unspecified   . Acute bronchitis   . Bunion   . Deviated nasal septum   . External hemorrhoids without mention of complication   . Herpes simplex without mention of complication   . Other abnormal glucose   . Unspecified essential hypertension   . Hypopotassemia   . Leukocytosis, unspecified   . Other screening mammogram   . Predominant disturbance of emotions   . Mixed incontinence urge and stress (female)(female)    Past Surgical History  Procedure Laterality Date  . Partial hysterectomy    . Endometrial ablation    . Dexa  12/2005    Normal  . Ls film  12/07    Mod OA Changes; Deg. changes LS-5  . Colonoscopy   03/2006    Negative   History  Substance Use Topics  . Smoking status: Never Smoker   . Smokeless tobacco: Never Used  . Alcohol Use: No   Family History  Problem Relation Age of Onset  . Hypertension Father   . Coronary artery disease Father     with CABG  . Hypertension Mother   . Colon cancer Mother   . Diabetes Mother   . Breast cancer Maternal Aunt   . Diabetes Maternal Aunt   . Colon cancer Maternal Uncle   . Colon cancer Paternal Uncle    Allergies  Allergen Reactions  . Amlodipine Besylate     REACTION: didn't work  . Augmentin (Amoxicillin-Pot Clavulanate) Diarrhea    Tore stomach up  . Moexipril-Hydrochlorothiazide     REACTION: felt bad  . Olmesartan Medoxomil     REACTION: fatigue and tingling  . Spironolactone     REACTION: diarrhea/ did not work  . Valsartan     REACTION: headache   Current Outpatient Prescriptions on File Prior to Visit  Medication Sig Dispense Refill  . amLODipine-benazepril (LOTREL) 5-20 MG per capsule Take 1 capsule by mouth daily.  30 capsule  12  . ANUCORT-HC 25 MG suppository UNWRAP AND INSERT ONE  SUPPOSITORY RECTALLY EVERY NIGHT AT BEDTIME AS NEEDED FOR HEMORRHOIDS  12 suppository  0  . hydrochlorothiazide (HYDRODIURIL) 50 MG tablet Take 1 tablet (50 mg total) by mouth daily.  30 tablet  12  . Multiple Vitamin (MULTIVITAMIN) tablet Take 1 tablet by mouth daily.        . Omega-3 Fatty Acids (FISH OIL CONCENTRATE PO) Take 1 capsule by mouth daily.        No current facility-administered medications on file prior to visit.      Review of Systems Review of Systems  Constitutional: Negative for fever, appetite change, fatigue and unexpected weight change.  Eyes: Negative for pain and visual disturbance.  Respiratory: Negative for cough and shortness of breath.   Cardiovascular: Negative for cp or palpitations    Gastrointestinal: Negative for nausea, diarrhea and constipation.  Genitourinary: Negative for urgency and frequency.   Skin: Negative for pallor or rash   Neurological: Negative for weakness, light-headedness, numbness and headaches.  Hematological: Negative for adenopathy. Does not bruise/bleed easily.  Psychiatric/Behavioral: Negative for dysphoric mood. The patient is not nervous/anxious.         Objective:   Physical Exam  Constitutional: She appears well-developed and well-nourished. No distress.  obese and well appearing   HENT:  Head: Normocephalic and atraumatic.  Eyes: Conjunctivae and EOM are normal. Pupils are equal, round, and reactive to light. No scleral icterus.  Neck: Normal range of motion. Neck supple.  Cardiovascular: Normal rate and regular rhythm.   Pulmonary/Chest: Effort normal and breath sounds normal. No respiratory distress. She has no wheezes. She has no rales.  Musculoskeletal: She exhibits tenderness. She exhibits no edema.  Tenderness over R medial bunion on foot and lateral lower leg  No swelling / varicosity/palpable cord/ erythema or heat Neg homan sign Nl rom knee and ankle   Mild LS tenderness  Neg SLR bilat  Nl rom hips   Lymphadenopathy:    She has no cervical adenopathy.  Neurological: She is alert. She has normal reflexes. She displays no atrophy. No cranial nerve deficit or sensory deficit. She exhibits normal muscle tone. Coordination normal.  Skin: Skin is warm and dry. No rash noted. No erythema.  Psychiatric: She has a normal mood and affect.          Assessment & Plan:

## 2012-03-27 NOTE — Patient Instructions (Addendum)
I wonder if your leg pain is referred from your back or involved in the foot issue  Use heat on low back and we will do xray today  Aleve is ok with food as needed

## 2012-03-27 NOTE — Assessment & Plan Note (Signed)
More pain in leg than back - this is recurrent and radiculopathy is possible  Check LS xray and update

## 2012-03-27 NOTE — Assessment & Plan Note (Signed)
Differential incl radicular pain from her LS or pain related to R medial bunion and resulting gait change  Reassuring exam  Xray of LS today

## 2012-03-28 ENCOUNTER — Telehealth: Payer: Self-pay | Admitting: Family Medicine

## 2012-03-28 DIAGNOSIS — M545 Low back pain, unspecified: Secondary | ICD-10-CM

## 2012-03-28 DIAGNOSIS — M79604 Pain in right leg: Secondary | ICD-10-CM

## 2012-03-28 NOTE — Telephone Encounter (Signed)
Caller: Rebecca [name]/Patient; Phone: [phone]; Reason for Call: Patient returning phone call regarding xray results.  Per Dr.  Milinda Antis, she would like to refer patient to an orthopedic specialist.  The patient would like to see the specialist.  Please call her with further instructions on what to do next regarding further treatment.

## 2012-03-28 NOTE — Telephone Encounter (Signed)
Referral done - let her know Rebecca [name] will be calling her

## 2012-03-29 NOTE — Telephone Encounter (Signed)
Pt.notified

## 2012-03-31 ENCOUNTER — Other Ambulatory Visit: Payer: Self-pay | Admitting: Family Medicine

## 2012-03-31 DIAGNOSIS — Z1231 Encounter for screening mammogram for malignant neoplasm of breast: Secondary | ICD-10-CM

## 2012-04-10 ENCOUNTER — Ambulatory Visit (HOSPITAL_COMMUNITY)
Admission: RE | Admit: 2012-04-10 | Discharge: 2012-04-10 | Disposition: A | Discharge status: Home / Self Care | Payer: BC Managed Care – PPO | Source: Ambulatory Visit | Attending: Family Medicine | Admitting: Family Medicine

## 2012-04-10 DIAGNOSIS — Z1231 Encounter for screening mammogram for malignant neoplasm of breast: Secondary | ICD-10-CM

## 2012-04-12 ENCOUNTER — Encounter: Payer: Self-pay | Admitting: *Deleted

## 2012-05-05 ENCOUNTER — Other Ambulatory Visit: Payer: Self-pay | Admitting: Family Medicine

## 2012-07-11 ENCOUNTER — Institutional Professional Consult (permissible substitution): Payer: BC Managed Care – PPO | Admitting: Family Medicine

## 2012-07-17 ENCOUNTER — Encounter: Payer: Self-pay | Admitting: Family Medicine

## 2012-07-17 ENCOUNTER — Encounter: Payer: BC Managed Care – PPO | Admitting: Family Medicine

## 2012-07-17 DIAGNOSIS — Z0289 Encounter for other administrative examinations: Secondary | ICD-10-CM

## 2012-07-17 NOTE — Progress Notes (Signed)
This encounter was created in error - please disregard.

## 2012-09-05 ENCOUNTER — Other Ambulatory Visit: Payer: Self-pay | Admitting: *Deleted

## 2012-09-05 NOTE — Telephone Encounter (Signed)
Please schedule f/u this fall and refill until then  

## 2012-09-06 NOTE — Telephone Encounter (Signed)
Left voicemail requesting pt to call office 

## 2012-09-07 MED ORDER — AMLODIPINE BESY-BENAZEPRIL HCL 5-20 MG PO CAPS: ORAL_CAPSULE | ORAL | Status: DC

## 2012-09-07 NOTE — Telephone Encounter (Signed)
appt scheduled and meds refilled until appt

## 2012-11-08 ENCOUNTER — Telehealth: Payer: Self-pay | Admitting: Family Medicine

## 2012-11-08 DIAGNOSIS — Z0000 Encounter for general adult medical examination without abnormal findings: Secondary | ICD-10-CM

## 2012-11-08 DIAGNOSIS — R7309 Other abnormal glucose: Secondary | ICD-10-CM

## 2012-11-08 NOTE — Telephone Encounter (Signed)
Message copied by Judy Pimple on Wed Nov 08, 2012  8:46 PM ------      Message from: Alvina Chou      Created: Tue Nov 07, 2012  3:57 PM      Regarding: Lab orders for Wednesday, 11.12.14       Patient is scheduled for CPX labs, please order future labs, Thanks , Terri       ------

## 2012-11-09 ENCOUNTER — Other Ambulatory Visit: Payer: Self-pay

## 2012-11-14 ENCOUNTER — Other Ambulatory Visit (INDEPENDENT_AMBULATORY_CARE_PROVIDER_SITE_OTHER): Payer: BC Managed Care – PPO

## 2012-11-14 DIAGNOSIS — E669 Obesity, unspecified: Secondary | ICD-10-CM

## 2012-11-14 DIAGNOSIS — R7309 Other abnormal glucose: Secondary | ICD-10-CM

## 2012-11-14 DIAGNOSIS — D72829 Elevated white blood cell count, unspecified: Secondary | ICD-10-CM

## 2012-11-14 DIAGNOSIS — Z0000 Encounter for general adult medical examination without abnormal findings: Secondary | ICD-10-CM

## 2012-11-14 DIAGNOSIS — E876 Hypokalemia: Secondary | ICD-10-CM

## 2012-11-14 DIAGNOSIS — I10 Essential (primary) hypertension: Secondary | ICD-10-CM

## 2012-11-14 LAB — CBC WITH DIFFERENTIAL/PLATELET
Basophils Absolute: 0 10*3/uL (ref 0.0–0.1)
Basophils Relative: 0.6 % (ref 0.0–3.0)
Eosinophils Absolute: 0.1 10*3/uL (ref 0.0–0.7)
Eosinophils Relative: 2.6 % (ref 0.0–5.0)
HCT: 39.5 % (ref 36.0–46.0)
Hemoglobin: 13.4 g/dL (ref 12.0–15.0)
Lymphocytes Relative: 43.3 % (ref 12.0–46.0)
Lymphs Abs: 1.6 10*3/uL (ref 0.7–4.0)
MCHC: 33.8 g/dL (ref 30.0–36.0)
MCV: 91.7 fl (ref 78.0–100.0)
Monocytes Absolute: 0.3 10*3/uL (ref 0.1–1.0)
Monocytes Relative: 9 % (ref 3.0–12.0)
Neutro Abs: 1.7 10*3/uL (ref 1.4–7.7)
Neutrophils Relative %: 44.5 % (ref 43.0–77.0)
Platelets: 306 10*3/uL (ref 150.0–400.0)
RBC: 4.31 Mil/uL (ref 3.87–5.11)
RDW: 13.2 % (ref 11.5–14.6)
WBC: 3.7 10*3/uL — ABNORMAL LOW (ref 4.5–10.5)

## 2012-11-14 LAB — COMPREHENSIVE METABOLIC PANEL
ALT: 21 U/L (ref 0–35)
AST: 25 U/L (ref 0–37)
Albumin: 4.1 g/dL (ref 3.5–5.2)
Alkaline Phosphatase: 54 U/L (ref 39–117)
BUN: 19 mg/dL (ref 6–23)
CO2: 29 mEq/L (ref 19–32)
Calcium: 9 mg/dL (ref 8.4–10.5)
Chloride: 104 mEq/L (ref 96–112)
Creatinine, Ser: 0.7 mg/dL (ref 0.4–1.2)
GFR: 108.16 mL/min (ref >60.00)
Glucose, Bld: 113 mg/dL — ABNORMAL HIGH (ref 70–99)
Potassium: 3.9 mEq/L (ref 3.5–5.1)
Sodium: 140 mEq/L (ref 135–145)
Total Bilirubin: 0.5 mg/dL (ref 0.3–1.2)
Total Protein: 7.3 g/dL (ref 6.0–8.3)

## 2012-11-14 LAB — TSH: TSH: 1.26 u[IU]/mL (ref 0.35–5.50)

## 2012-11-14 LAB — LIPID PANEL
Cholesterol: 198 mg/dL (ref 0–200)
HDL: 59.5 mg/dL (ref >39.00)
LDL Cholesterol: 128 mg/dL — ABNORMAL HIGH (ref 0–99)
Total CHOL/HDL Ratio: 3
Triglycerides: 51 mg/dL (ref 0.0–149.0)
VLDL: 10.2 mg/dL (ref 0.0–40.0)

## 2012-11-14 LAB — HEMOGLOBIN A1C: Hgb A1c MFr Bld: 6.3 % (ref 4.6–6.5)

## 2012-11-15 ENCOUNTER — Other Ambulatory Visit: Payer: BC Managed Care – PPO

## 2012-11-20 ENCOUNTER — Encounter: Payer: Self-pay | Admitting: Family Medicine

## 2012-11-20 ENCOUNTER — Ambulatory Visit (INDEPENDENT_AMBULATORY_CARE_PROVIDER_SITE_OTHER): Payer: BC Managed Care – PPO | Admitting: Family Medicine

## 2012-11-20 VITALS — BP 124/82 | HR 66 | Temp 98.1°F | Ht 66.5 in | Wt 212.2 lb

## 2012-11-20 DIAGNOSIS — M79673 Pain in unspecified foot: Secondary | ICD-10-CM | POA: Insufficient documentation

## 2012-11-20 DIAGNOSIS — I10 Essential (primary) hypertension: Secondary | ICD-10-CM

## 2012-11-20 DIAGNOSIS — Z0000 Encounter for general adult medical examination without abnormal findings: Secondary | ICD-10-CM

## 2012-11-20 DIAGNOSIS — M79609 Pain in unspecified limb: Secondary | ICD-10-CM

## 2012-11-20 DIAGNOSIS — M79672 Pain in left foot: Secondary | ICD-10-CM

## 2012-11-20 DIAGNOSIS — R7309 Other abnormal glucose: Secondary | ICD-10-CM

## 2012-11-20 DIAGNOSIS — E669 Obesity, unspecified: Secondary | ICD-10-CM

## 2012-11-20 DIAGNOSIS — Z23 Encounter for immunization: Secondary | ICD-10-CM

## 2012-11-20 MED ORDER — HYDROCORTISONE 2.5 % RE CREA: TOPICAL_CREAM | RECTAL | Status: DC

## 2012-11-20 MED ORDER — AMLODIPINE BESY-BENAZEPRIL HCL 5-20 MG PO CAPS: ORAL_CAPSULE | ORAL | Status: DC

## 2012-11-20 MED ORDER — HYDROCHLOROTHIAZIDE 50 MG PO TABS: 50.0000 mg | ORAL_TABLET | Freq: Every day | ORAL | Status: DC

## 2012-11-20 NOTE — Progress Notes (Signed)
Subjective:    Patient ID: Rebecca [name], female    DOB: 1955/11/01, 57 y.o.   MRN: [id number]  HPI Here for health maintenance exam and to review chronic medical problems   Is doing pretty well overall   Wt is up 2 lb with bmi of 33   Pap 1/06 Had a partial hysterectomy No gyn problems   Declines flu vaccine in the past but will get one today/ she works with children  Td 12/04- wants to get that today Due for that  Colonoscopy 11/13- did well with that -has a 5 year recall  Mother had colon cancer  Has hemorrhoids - cream is most helpful-needs a refill   Mammogram 4/14 normal-she will make her own appt for yearly Self exam - no lumps on self exam   Hyperglycemia Lab Results  Component Value Date   HGBA1C 6.3 11/14/2012   was 6.0 She is eating differently- a lot more candy lately and some sweet tea  Has not been exercising like she should Wt is up 2 lb  She feels better when she does exercise   bp is stable today  No cp or palpitations or headaches or edema  No side effects to medicines  BP Readings from Last 3 Encounters:  11/20/12 124/82  03/27/12 122/68  11/15/11 116/73       Chemistry      Component Value Date/Time   NA 140 11/14/2012 0857   K 3.9 11/14/2012 0857   CL 104 11/14/2012 0857   CO2 29 11/14/2012 0857   BUN 19 11/14/2012 0857   CREATININE 0.7 11/14/2012 0857      Component Value Date/Time   CALCIUM 9.0 11/14/2012 0857   ALKPHOS 54 11/14/2012 0857   AST 25 11/14/2012 0857   ALT 21 11/14/2012 0857   BILITOT 0.5 11/14/2012 0857       Lab Results  Component Value Date   WBC 3.7* 11/14/2012   HGB 13.4 11/14/2012   HCT 39.5 11/14/2012   MCV 91.7 11/14/2012   PLT 306.0 11/14/2012   Lab Results  Component Value Date   TSH 1.26 11/14/2012    Lab Results  Component Value Date   CHOL 198 11/14/2012   CHOL 188 09/07/2011   CHOL 203* 08/11/2009   Lab Results  Component Value Date   HDL 59.50 11/14/2012   HDL 64.70 09/07/2011   HDL  40.98 08/11/2009   Lab Results  Component Value Date   LDLCALC 128* 11/14/2012   LDLCALC 114* 09/07/2011   LDLCALC 124* 03/04/2008   Lab Results  Component Value Date   TRIG 51.0 11/14/2012   TRIG 48.0 09/07/2011   TRIG 100.0 08/11/2009   Lab Results  Component Value Date   CHOLHDL 3 11/14/2012   CHOLHDL 3 09/07/2011   CHOLHDL 3 08/11/2009   Lab Results  Component Value Date   LDLDIRECT 107.3 08/11/2009   risk ratio is the same as last year      Review of Systems     Objective:   Physical Exam  Constitutional: She appears well-developed and well-nourished. No distress.  obese and well appearing   HENT:  Head: Normocephalic and atraumatic.  Right Ear: External ear normal.  Left Ear: External ear normal.  Mouth/Throat: Oropharynx is clear and moist.  Eyes: Conjunctivae and EOM are normal. Pupils are equal, round, and reactive to light. No scleral icterus.  Neck: Normal range of motion. Neck supple. No JVD present. Carotid bruit is not present. No thyromegaly  present.  Cardiovascular: Normal rate, regular rhythm, normal heart sounds and intact distal pulses.  Exam reveals no gallop.   Pulmonary/Chest: Effort normal and breath sounds normal. No respiratory distress. She has no wheezes. She exhibits no tenderness.  Abdominal: Soft. Bowel sounds are normal. She exhibits no distension, no abdominal bruit and no mass. There is no tenderness.  Genitourinary: No breast swelling, tenderness, discharge or bleeding.  Breast exam: No mass, nodules, thickening, tenderness, bulging, retraction, inflamation, nipple discharge or skin changes noted.  No axillary or clavicular LA.  Chaperoned exam.    Musculoskeletal: Normal range of motion. She exhibits no edema and no tenderness.  Firm knot/nodule over tendon on bottom of L foot that is tender without skin changes    Lymphadenopathy:    She has no cervical adenopathy.  Neurological: She is alert. She has normal reflexes. No cranial nerve deficit.  She exhibits normal muscle tone. Coordination normal.  Skin: Skin is warm and dry. No rash noted. No erythema. No pallor.  Psychiatric: She has a normal mood and affect.          Assessment & Plan:

## 2012-11-20 NOTE — Assessment & Plan Note (Signed)
Reviewed health habits including diet and exercise and skin cancer prevention Also reviewed health mt list, fam hx and immunizations  Labs reviewed Flu vaccine and Tdap today

## 2012-11-20 NOTE — Assessment & Plan Note (Signed)
Lab Results  Component Value Date   HGBA1C 6.3 11/14/2012   This is up with worse diet and no exercise Long disc re: low glycemic diet/ exercise 5d per week and wt loss effort  F/u 6 mo with lab prior  Pt understands she is prediabetic

## 2012-11-20 NOTE — Progress Notes (Signed)
Pre-visit discussion using our clinic review tool. No additional management support is needed unless otherwise documented below in the visit note.  

## 2012-11-20 NOTE — Assessment & Plan Note (Signed)
Discussed how this problem influences overall health and the risks it imposes  Reviewed plan for weight loss with lower calorie diet (via better food choices and also portion control or program like weight watchers) and exercise building up to or more than 30 minutes 5 days per week including some aerobic activity    

## 2012-11-20 NOTE — Assessment & Plan Note (Signed)
BP: 124/82 mmHg   bp in fair control at this time  No changes needed  Disc lifstyle change with low sodium diet and exercise   Enc wt loss  Labs reviewed

## 2012-11-20 NOTE — Assessment & Plan Note (Signed)
Pain with nodule on plantar surface of L foot  Ref to podiatry for eval

## 2012-11-20 NOTE — Patient Instructions (Signed)
Don't forget to schedule your yearly mammogram for April Tdap (tetanus) vaccine today  Flu shot today  Start working on weight loss with low sugar/low fat diet and exercise Follow up in 6 months with labs prior  We will refer you to a podiatrist for lump in you left foot

## 2012-11-22 ENCOUNTER — Ambulatory Visit (INDEPENDENT_AMBULATORY_CARE_PROVIDER_SITE_OTHER)
Admission: RE | Admit: 2012-11-22 | Discharge: 2012-11-22 | Disposition: A | Discharge status: Home / Self Care | Payer: BC Managed Care – PPO | Source: Ambulatory Visit | Attending: Family Medicine | Admitting: Family Medicine

## 2012-11-22 ENCOUNTER — Ambulatory Visit (INDEPENDENT_AMBULATORY_CARE_PROVIDER_SITE_OTHER): Payer: BC Managed Care – PPO | Admitting: Family Medicine

## 2012-11-22 ENCOUNTER — Encounter: Payer: Self-pay | Admitting: Family Medicine

## 2012-11-22 VITALS — BP 160/84 | HR 78 | Temp 98.6°F | Ht 66.75 in | Wt 213.5 lb

## 2012-11-22 DIAGNOSIS — M25539 Pain in unspecified wrist: Secondary | ICD-10-CM

## 2012-11-22 DIAGNOSIS — S5290XA Unspecified fracture of unspecified forearm, initial encounter for closed fracture: Secondary | ICD-10-CM

## 2012-11-22 DIAGNOSIS — S52599A Other fractures of lower end of unspecified radius, initial encounter for closed fracture: Secondary | ICD-10-CM

## 2012-11-22 DIAGNOSIS — R011 Cardiac murmur, unspecified: Secondary | ICD-10-CM

## 2012-11-22 DIAGNOSIS — M25532 Pain in left wrist: Secondary | ICD-10-CM

## 2012-11-22 DIAGNOSIS — M542 Cervicalgia: Secondary | ICD-10-CM

## 2012-11-22 DIAGNOSIS — S52502A Unspecified fracture of the lower end of left radius, initial encounter for closed fracture: Secondary | ICD-10-CM

## 2012-11-22 HISTORY — DX: Unspecified fracture of unspecified forearm, initial encounter for closed fracture: S52.90XA

## 2012-11-22 NOTE — Patient Instructions (Signed)
Wear the splint and use the sling for now.  If the splint is too tight, then unwrap/rewrap it. Rebecca [name] will call about your referral. Go see Rebecca [name] on the way out.  NEEDS ORTHO EVAL THIS WEEK.  RADIAL FRACTURE.  Take care.
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
## 2012-11-23 ENCOUNTER — Other Ambulatory Visit: Payer: Self-pay | Admitting: Orthopedic Surgery

## 2012-11-23 ENCOUNTER — Ambulatory Visit
Admission: RE | Admit: 2012-11-23 | Discharge: 2012-11-23 | Disposition: A | Discharge status: Home / Self Care | Payer: BC Managed Care – PPO | Source: Ambulatory Visit | Attending: Orthopedic Surgery | Admitting: Orthopedic Surgery

## 2012-11-23 DIAGNOSIS — Z8781 Personal history of (healed) traumatic fracture: Secondary | ICD-10-CM | POA: Insufficient documentation

## 2012-11-23 DIAGNOSIS — R011 Cardiac murmur, unspecified: Secondary | ICD-10-CM | POA: Insufficient documentation

## 2012-11-23 DIAGNOSIS — S52502A Unspecified fracture of the lower end of left radius, initial encounter for closed fracture: Secondary | ICD-10-CM
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
## 2012-11-23 NOTE — Assessment & Plan Note (Signed)
Apparently a new finding. Will defer to PCP on w/u.  No edema and ctab today.  This doesn't appear urgent.

## 2012-11-24 ENCOUNTER — Other Ambulatory Visit: Payer: Self-pay | Admitting: Orthopedic Surgery

## 2012-11-24 ENCOUNTER — Encounter (HOSPITAL_BASED_OUTPATIENT_CLINIC_OR_DEPARTMENT_OTHER): Payer: Self-pay | Admitting: *Deleted

## 2012-11-24 NOTE — Pre-Procedure Instructions (Addendum)
To come for EKG Pt. had CMET 11/14/2012 at PCP

## 2012-11-27 ENCOUNTER — Other Ambulatory Visit: Payer: Self-pay

## 2012-11-27 ENCOUNTER — Encounter (HOSPITAL_BASED_OUTPATIENT_CLINIC_OR_DEPARTMENT_OTHER)
Admission: RE | Admit: 2012-11-27 | Discharge: 2012-11-27 | Disposition: A | Discharge status: Home / Self Care | Payer: BC Managed Care – PPO | Source: Ambulatory Visit | Attending: Orthopedic Surgery | Admitting: Orthopedic Surgery

## 2012-11-27 NOTE — Pre-Procedure Instructions (Signed)
Discussed new finding of heart murmur with Dr. Sampson Goon; will evaluate pt. DOS

## 2012-11-29 ENCOUNTER — Encounter (HOSPITAL_BASED_OUTPATIENT_CLINIC_OR_DEPARTMENT_OTHER)
Admission: RE | Disposition: A | Discharge status: Home / Self Care | Payer: Self-pay | Source: Ambulatory Visit | Attending: Orthopedic Surgery

## 2012-11-29 ENCOUNTER — Encounter (HOSPITAL_BASED_OUTPATIENT_CLINIC_OR_DEPARTMENT_OTHER): Payer: Self-pay | Admitting: Orthopedic Surgery

## 2012-11-29 ENCOUNTER — Ambulatory Visit (HOSPITAL_BASED_OUTPATIENT_CLINIC_OR_DEPARTMENT_OTHER)
Admission: RE | Admit: 2012-11-29 | Discharge: 2012-11-29 | Disposition: A | Discharge status: Home / Self Care | Payer: BC Managed Care – PPO | Source: Ambulatory Visit | Attending: Orthopedic Surgery | Admitting: Orthopedic Surgery

## 2012-11-29 ENCOUNTER — Encounter (HOSPITAL_BASED_OUTPATIENT_CLINIC_OR_DEPARTMENT_OTHER): Payer: BC Managed Care – PPO | Admitting: Anesthesiology

## 2012-11-29 ENCOUNTER — Ambulatory Visit (HOSPITAL_BASED_OUTPATIENT_CLINIC_OR_DEPARTMENT_OTHER): Payer: BC Managed Care – PPO | Admitting: Anesthesiology

## 2012-11-29 DIAGNOSIS — S52539A Colles' fracture of unspecified radius, initial encounter for closed fracture: Secondary | ICD-10-CM | POA: Insufficient documentation

## 2012-11-29 DIAGNOSIS — IMO0002 Reserved for concepts with insufficient information to code with codable children: Secondary | ICD-10-CM | POA: Insufficient documentation

## 2012-11-29 DIAGNOSIS — K649 Unspecified hemorrhoids: Secondary | ICD-10-CM | POA: Insufficient documentation

## 2012-11-29 DIAGNOSIS — I10 Essential (primary) hypertension: Secondary | ICD-10-CM | POA: Insufficient documentation

## 2012-11-29 DIAGNOSIS — Z79899 Other long term (current) drug therapy: Secondary | ICD-10-CM | POA: Insufficient documentation

## 2012-11-29 DIAGNOSIS — R011 Cardiac murmur, unspecified: Secondary | ICD-10-CM | POA: Insufficient documentation

## 2012-11-29 DIAGNOSIS — Z01810 Encounter for preprocedural cardiovascular examination: Secondary | ICD-10-CM | POA: Insufficient documentation

## 2012-11-29 HISTORY — DX: Unspecified fracture of unspecified forearm, initial encounter for closed fracture: S52.90XA

## 2012-11-29 HISTORY — PX: OPEN REDUCTION INTERNAL FIXATION (ORIF) DISTAL RADIAL FRACTURE: SHX5989

## 2012-11-29 HISTORY — DX: Unspecified hemorrhoids: K64.9

## 2012-11-29 HISTORY — DX: Cardiac murmur, unspecified: R01.1

## 2012-11-29 HISTORY — DX: Essential (primary) hypertension: I10

## 2012-11-29 SURGERY — OPEN REDUCTION INTERNAL FIXATION (ORIF) DISTAL RADIUS FRACTURE
Anesthesia: Regional | Site: Wrist | Laterality: Left | Wound class: Clean

## 2012-11-29 MED ORDER — CHLORHEXIDINE GLUCONATE 4 % EX LIQD: 60.0000 mL | Freq: Once | CUTANEOUS | Status: DC

## 2012-11-29 MED ORDER — LACTATED RINGERS IV SOLN
INTRAVENOUS | Status: DC
  Administered 2012-11-29: 10:00:00 via INTRAVENOUS

## 2012-11-29 MED ORDER — BUPIVACAINE HCL (PF) 0.5 % IJ SOLN
INTRAMUSCULAR | Status: DC | PRN
  Administered 2012-11-29: 8 mL

## 2012-11-29 MED ORDER — MIDAZOLAM HCL 2 MG/2ML IJ SOLN
INTRAMUSCULAR | Status: AC
  Filled 2012-11-29: qty 2

## 2012-11-29 MED ORDER — MIDAZOLAM HCL 2 MG/ML PO SYRP: 12.0000 mg | ORAL_SOLUTION | Freq: Once | ORAL | Status: DC | PRN

## 2012-11-29 MED ORDER — OXYCODONE HCL 5 MG/5ML PO SOLN: 5.0000 mg | Freq: Once | ORAL | Status: DC | PRN

## 2012-11-29 MED ORDER — VANCOMYCIN HCL IN DEXTROSE 1-5 GM/200ML-% IV SOLN
1000.0000 mg | INTRAVENOUS | Status: AC
  Administered 2012-11-29: 1000 mg via INTRAVENOUS

## 2012-11-29 MED ORDER — OXYCODONE-ACETAMINOPHEN 10-325 MG PO TABS: 1.0000 | ORAL_TABLET | ORAL | Status: DC | PRN

## 2012-11-29 MED ORDER — ONDANSETRON HCL 4 MG/2ML IJ SOLN
INTRAMUSCULAR | Status: DC | PRN
  Administered 2012-11-29: 4 mg via INTRAVENOUS

## 2012-11-29 MED ORDER — OXYCODONE HCL 5 MG PO TABS: 5.0000 mg | ORAL_TABLET | Freq: Once | ORAL | Status: DC | PRN

## 2012-11-29 MED ORDER — EPHEDRINE SULFATE 50 MG/ML IJ SOLN
INTRAMUSCULAR | Status: DC | PRN
  Administered 2012-11-29: 10 mg via INTRAVENOUS

## 2012-11-29 MED ORDER — FENTANYL CITRATE 0.05 MG/ML IJ SOLN
50.0000 ug | INTRAMUSCULAR | Status: DC | PRN
  Administered 2012-11-29: 100 ug via INTRAVENOUS

## 2012-11-29 MED ORDER — MIDAZOLAM HCL 2 MG/2ML IJ SOLN
1.0000 mg | INTRAMUSCULAR | Status: DC | PRN
  Administered 2012-11-29: 2 mg via INTRAVENOUS

## 2012-11-29 MED ORDER — MIDAZOLAM HCL 5 MG/5ML IJ SOLN
INTRAMUSCULAR | Status: DC | PRN
  Administered 2012-11-29: 2 mg via INTRAVENOUS

## 2012-11-29 MED ORDER — PROPOFOL 10 MG/ML IV BOLUS
INTRAVENOUS | Status: DC | PRN
  Administered 2012-11-29: 200 mg via INTRAVENOUS

## 2012-11-29 MED ORDER — DEXAMETHASONE SODIUM PHOSPHATE 10 MG/ML IJ SOLN
INTRAMUSCULAR | Status: DC | PRN
  Administered 2012-11-29: 10 mg via INTRAVENOUS

## 2012-11-29 MED ORDER — FENTANYL CITRATE 0.05 MG/ML IJ SOLN
INTRAMUSCULAR | Status: AC
  Filled 2012-11-29: qty 2

## 2012-11-29 MED ORDER — LIDOCAINE HCL (PF) 1 % IJ SOLN
INTRAMUSCULAR | Status: AC
  Filled 2012-11-29: qty 30

## 2012-11-29 MED ORDER — BUPIVACAINE-EPINEPHRINE PF 0.5-1:200000 % IJ SOLN
INTRAMUSCULAR | Status: DC | PRN
  Administered 2012-11-29: 30 mL via PERINEURAL

## 2012-11-29 MED ORDER — FENTANYL CITRATE 0.05 MG/ML IJ SOLN
INTRAMUSCULAR | Status: AC
  Filled 2012-11-29: qty 6

## 2012-11-29 MED ORDER — LIDOCAINE HCL (CARDIAC) 20 MG/ML IV SOLN
INTRAVENOUS | Status: DC | PRN
  Administered 2012-11-29: 30 mg via INTRAVENOUS

## 2012-11-29 MED ORDER — HYDROMORPHONE HCL PF 1 MG/ML IJ SOLN: 0.2500 mg | INTRAMUSCULAR | Status: DC | PRN

## 2012-11-29 MED ORDER — VANCOMYCIN HCL IN DEXTROSE 1-5 GM/200ML-% IV SOLN
INTRAVENOUS | Status: AC
  Filled 2012-11-29: qty 200

## 2012-11-29 SURGICAL SUPPLY — 64 items
BANDAGE GAUZE ELAST BULKY 4 IN (GAUZE/BANDAGES/DRESSINGS) ×2 IMPLANT
BIT DRILL SOLID 2.0X40MM (BIT) ×1 IMPLANT
BIT DRILL SOLID 2.5X40MM (BIT) ×1 IMPLANT
BLADE MINI RND TIP GREEN BEAV (BLADE) IMPLANT
BLADE SURG 15 STRL LF DISP TIS (BLADE) ×1 IMPLANT
BLADE SURG 15 STRL SS (BLADE) ×1
BNDG CMPR 9X4 STRL LF SNTH (GAUZE/BANDAGES/DRESSINGS) ×1
BNDG COHESIVE 3X5 TAN STRL LF (GAUZE/BANDAGES/DRESSINGS) ×2 IMPLANT
BNDG ESMARK 4X9 LF (GAUZE/BANDAGES/DRESSINGS) ×2 IMPLANT
CHLORAPREP W/TINT 26ML (MISCELLANEOUS) ×2 IMPLANT
CORDS BIPOLAR (ELECTRODE) ×2 IMPLANT
COVER MAYO STAND STRL (DRAPES) ×2 IMPLANT
COVER TABLE BACK 60X90 (DRAPES) ×2 IMPLANT
CUFF TOURNIQUET SINGLE 18IN (TOURNIQUET CUFF) ×2 IMPLANT
DECANTER SPIKE VIAL GLASS SM (MISCELLANEOUS) IMPLANT
DRAPE EXTREMITY T 121X128X90 (DRAPE) ×2 IMPLANT
DRAPE OEC MINIVIEW 54X84 (DRAPES) ×2 IMPLANT
DRAPE SURG 17X23 STRL (DRAPES) ×2 IMPLANT
DRILL SOLID 2.0X40MM (BIT) ×2
DRILL SOLID 2.5X40MM (BIT) ×2
DRSG KUZMA FLUFF (GAUZE/BANDAGES/DRESSINGS) IMPLANT
ELECT REM PT RETURN 9FT ADLT (ELECTROSURGICAL)
ELECTRODE REM PT RTRN 9FT ADLT (ELECTROSURGICAL) IMPLANT
GAUZE XEROFORM 1X8 LF (GAUZE/BANDAGES/DRESSINGS) ×2 IMPLANT
GLOVE BIO SURGEON STRL SZ 6.5 (GLOVE) ×6 IMPLANT
GLOVE BIOGEL PI IND STRL 7.0 (GLOVE) ×2 IMPLANT
GLOVE BIOGEL PI IND STRL 8.5 (GLOVE) ×1 IMPLANT
GLOVE BIOGEL PI INDICATOR 7.0 (GLOVE) ×2
GLOVE BIOGEL PI INDICATOR 8.5 (GLOVE) ×1
GLOVE SURG ORTHO 8.0 STRL STRW (GLOVE) ×2 IMPLANT
GOWN BRE IMP PREV XXLGXLNG (GOWN DISPOSABLE) ×2 IMPLANT
GOWN PREVENTION PLUS XLARGE (GOWN DISPOSABLE) ×6 IMPLANT
GUIDE AIMING 1.5MM (WIRE) ×4 IMPLANT
K-WIRE .045X4 (WIRE) IMPLANT
NEEDLE 27GAX1X1/2 (NEEDLE) ×2 IMPLANT
NS IRRIG 1000ML POUR BTL (IV SOLUTION) ×2 IMPLANT
PACK BASIN DAY SURGERY FS (CUSTOM PROCEDURE TRAY) ×2 IMPLANT
PAD CAST 3X4 CTTN HI CHSV (CAST SUPPLIES) ×1 IMPLANT
PADDING CAST ABS 3INX4YD NS (CAST SUPPLIES)
PADDING CAST ABS 4INX4YD NS (CAST SUPPLIES) ×1
PADDING CAST ABS COTTON 3X4 (CAST SUPPLIES) IMPLANT
PADDING CAST ABS COTTON 4X4 ST (CAST SUPPLIES) ×1 IMPLANT
PADDING CAST COTTON 3X4 STRL (CAST SUPPLIES) ×2
PENCIL BUTTON HOLSTER BLD 10FT (ELECTRODE) IMPLANT
SKELETAL DYNAMICS DVR SET (Set) ×2 IMPLANT
SLEEVE SCD COMPRESS KNEE MED (MISCELLANEOUS) ×2 IMPLANT
SPLINT PLASTER CAST XFAST 3X15 (CAST SUPPLIES) IMPLANT
SPLINT PLASTER XTRA FASTSET 3X (CAST SUPPLIES)
SPONGE GAUZE 4X4 12PLY (GAUZE/BANDAGES/DRESSINGS) ×2 IMPLANT
STOCKINETTE 4X48 STRL (DRAPES) ×2 IMPLANT
SUT VIC AB 0 CT1 27 (SUTURE)
SUT VIC AB 0 CT1 27XBRD ANBCTR (SUTURE) IMPLANT
SUT VIC AB 2-0 SH 27 (SUTURE)
SUT VIC AB 2-0 SH 27XBRD (SUTURE) IMPLANT
SUT VIC AB 3-0 FS2 27 (SUTURE) IMPLANT
SUT VICRYL 4-0 PS2 18IN ABS (SUTURE) ×2 IMPLANT
SUT VICRYL RAPID 5 0 P 3 (SUTURE) IMPLANT
SUT VICRYL RAPIDE 4/0 PS 2 (SUTURE) ×2 IMPLANT
SYR BULB 3OZ (MISCELLANEOUS) ×2 IMPLANT
SYR CONTROL 10ML LL (SYRINGE) ×2 IMPLANT
TOWEL OR 17X24 6PK STRL BLUE (TOWEL DISPOSABLE) ×2 IMPLANT
UNDERPAD 30X30 INCONTINENT (UNDERPADS AND DIAPERS) ×2 IMPLANT
WIRE FIX 1.5 STANDARD TIP (WIRE) ×6
WIRE FIX 1.5 STD TIP (WIRE) ×3 IMPLANT

## 2012-11-29 NOTE — Transfer of Care (Signed)
Immediate Anesthesia Transfer of Care Note  Patient: Rebecca [name]  Procedure(s) Performed: Procedure(s): OPEN REDUCTION INTERNAL FIXATION (ORIF) LEFT  DISTAL RADIAL FRACTURE (Left)  Patient Location: PACU  Anesthesia Type:GA combined with regional for post-op pain  Level of Consciousness: awake, alert , oriented and patient cooperative  Airway & Oxygen Therapy: Patient Spontanous Breathing and Patient connected to face mask oxygen  Post-op Assessment: Report given to PACU RN and Post -op Vital signs reviewed and stable  Post vital signs: Reviewed and stable  Complications: No apparent anesthesia complications

## 2012-11-29 NOTE — Anesthesia Postprocedure Evaluation (Signed)
  Anesthesia Post-op Note  Patient: Rebecca [name]  Procedure(s) Performed: Procedure(s): OPEN REDUCTION INTERNAL FIXATION (ORIF) LEFT  DISTAL RADIAL FRACTURE (Left)  Patient Location: PACU  Anesthesia Type:General and block  Level of Consciousness: awake and alert   Airway and Oxygen Therapy: Patient Spontanous Breathing  Post-op Pain: none  Post-op Assessment: Post-op Vital signs reviewed, Patient's Cardiovascular Status Stable and Respiratory Function Stable  Post-op Vital Signs: Reviewed  Filed Vitals:   11/29/12 1330  BP: 115/69  Pulse: 72  Temp:   Resp: 17    Complications: No apparent anesthesia complications

## 2012-11-29 NOTE — Anesthesia Preprocedure Evaluation (Signed)
Anesthesia Evaluation  Patient identified by MRN, date of birth, ID band Patient awake    Reviewed: Allergy & Precautions, H&P , NPO status , Patient's Chart, lab work & pertinent test results  Airway Mallampati: II TM Distance: >3 FB Neck ROM: Full    Dental no notable dental hx. (+) Teeth Intact and Dental Advisory Given   Pulmonary neg pulmonary ROS,  breath sounds clear to auscultation  Pulmonary exam normal       Cardiovascular hypertension, On Medications Rhythm:Regular Rate:Normal     Neuro/Psych negative neurological ROS  negative psych ROS   GI/Hepatic negative GI ROS, Neg liver ROS,   Endo/Other  negative endocrine ROS  Renal/GU negative Renal ROS  negative genitourinary   Musculoskeletal   Abdominal   Peds  Hematology negative hematology ROS (+)   Anesthesia Other Findings   Reproductive/Obstetrics negative OB ROS                           Anesthesia Physical Anesthesia Plan  ASA: II  Anesthesia Plan: General and Regional   Post-op Pain Management:    Induction: Intravenous  Airway Management Planned: LMA  Additional Equipment:   Intra-op Plan:   Post-operative Plan: Extubation in OR  Informed Consent: I have reviewed the patients History and Physical, chart, labs and discussed the procedure including the risks, benefits and alternatives for the proposed anesthesia with the patient or authorized representative who has indicated his/her understanding and acceptance.   Dental advisory given  Plan Discussed with: CRNA  Anesthesia Plan Comments:         Anesthesia Quick Evaluation  

## 2012-11-29 NOTE — Op Note (Signed)
Dictation Number [id number]

## 2012-11-29 NOTE — Progress Notes (Signed)
Assisted Dr. Fitzgerald with left, ultrasound guided, supraclavicular block. Side rails up, monitors on throughout procedure. See vital signs in flow sheet. Tolerated Procedure well. 

## 2012-11-29 NOTE — Brief Op Note (Signed)
11/29/2012  1:12 PM  PATIENT:  Rebecca [name]  57 y.o. female  PRE-OPERATIVE DIAGNOSIS:  COMMINUTED LEFT RADIUS FRACTURE  POST-OPERATIVE DIAGNOSIS:  COMMINUTED LEFT RADIUS FRACTURE  PROCEDURE:  Procedure(s): OPEN REDUCTION INTERNAL FIXATION (ORIF) LEFT  DISTAL RADIAL FRACTURE (Left)  SURGEON:  Surgeon(s) and Role:    * Nicki Reaper, MD - Primary  PHYSICIAN ASSISTANT:   ASSISTANTS: none   ANESTHESIA:   regional and general  EBL:  Total I/O In: 200 [I.V.:200] Out: -   BLOOD ADMINISTERED:none  DRAINS: none   LOCAL MEDICATIONS USED:  NONE  SPECIMEN:  No Specimen  DISPOSITION OF SPECIMEN:  N/A  COUNTS:  YES  TOURNIQUET:   Total Tourniquet Time Documented: Upper Arm (Left) - 62 minutes Total: Upper Arm (Left) - 62 minutes   DICTATION: .Other Dictation: Dictation Number [id number]  PLAN OF CARE: Discharge to home after PACU  PATIENT DISPOSITION:  PACU - hemodynamically stable.

## 2012-11-29 NOTE — Anesthesia Procedure Notes (Addendum)
Anesthesia Regional Block:  Supraclavicular block  Pre-Anesthetic Checklist: ,, timeout performed, Correct Patient, Correct Site, Correct Laterality, Correct Procedure, Correct Position, site marked, Risks and benefits discussed, pre-op evaluation, post-op pain management  Laterality: Left  Prep: Maximum Sterile Barrier Precautions used and chloraprep       Needles:  Injection technique: Single-shot  Needle Type: Echogenic Stimulator Needle     Needle Length: 5cm 5 cm Needle Gauge: 22 and 22 G    Additional Needles:  Procedures: ultrasound guided (picture in chart) Supraclavicular block Narrative:  Start time: 11/29/2012 10:05 AM End time: 11/29/2012 10:14 AM Injection made incrementally with aspirations every 5 mL. Anesthesiologist: Fitzgerald,MD  Additional Notes: 2% Lidocaine skin wheel. Intercostobrachial block with 8cc of 0.5% Bupivicaine plain.   Procedure Name: LMA Insertion Date/Time: 11/29/2012 11:53 AM Performed by: Nicki Reaper Pre-anesthesia Checklist: Patient identified, Emergency Drugs available, Suction available and Patient being monitored Patient Re-evaluated:Patient Re-evaluated prior to inductionOxygen Delivery Method: Circle System Utilized Preoxygenation: Pre-oxygenation with 100% oxygen Intubation Type: IV induction Ventilation: Mask ventilation without difficulty LMA: LMA inserted LMA Size: 4.0 Number of attempts: 1 Airway Equipment and Method: bite block Placement Confirmation: positive ETCO2 Tube secured with: Tape Dental Injury: Teeth and Oropharynx as per pre-operative assessment
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
## 2012-11-30 NOTE — Op Note (Deleted)
NAMELUNDYN, COSTE               ACCOUNT NO.:  [id number]  MEDICAL RECORD NO.:  [id number]  LOCATION:                               FACILITY:  [facility]  PHYSICIAN:  Cindee Salt, M.D.            DATE OF BIRTH:  DATE OF PROCEDURE:  11/29/2012 DATE OF DISCHARGE:  11/29/2012                              OPERATIVE REPORT   PREOPERATIVE DIAGNOSIS:  Comminuted intra-articular fracture, left distal radius.  POSTOPERATIVE DIAGNOSIS:  Comminuted intra-articular fracture, left distal radius.  OPERATION:  Open reduction and internal fixation, 3 part fracture, left distal radius.  SURGEON:  Cindee Salt, M.D.  ANESTHESIA:  Supraclavicular block general.  ANESTHESIOLOGIST:  Dr. Sampson Goon.  HISTORY:  The patient is a 57 year old female who suffered a fall with a comminuted intra-articular fracture of her left distal radius.  CT scans reveal that there is an articular component to this.  She is admitted for open reduction and internal fixation.  Pre, peri, and postoperative course have been discussed along with risks and complications.  She is agreed to proceed.  She is aware that there is no guarantee with the surgery; possibility of infection; recurrence of injury to arteries, nerves, tendons, incomplete relief of symptoms, dystrophy of the preoperative area.  The patient is seen, the extremity marked by both patient and surgeon.  Antibiotic given.  PROCEDURE IN DETAIL:  The patient was brought to the operating room where a supraclavicular block general anesthetic was carried out without difficulty.  She was prepped using ChloraPrep, supine position with the left arm free.  A time-out taken confirming the patient and the procedure.  The limb was exsanguinated with an Esmarch bandage. Tourniquet was placed high and the arm was inflated to 250 mmHg.  A volar incision for volar distal radius plate was then made, carried down through subcutaneous tissue.  Bleeders were electrocauterized  with bipolar.  Dissection carried down to the flexor carpi radialis tendon sheath.  The pronator quadratus was then incised on its radial border. The brachioradialis was released.  The fracture was identified, and found to be markedly distal.  This was reduced, maintained in position. A skeletal dynamics volar plate left was selected.  This was a standard plate, 3 hole proximal.  This was placed.  The guide pin were inserted through the holes.  These were found to be slightly distal.  This was readjusted.  X-rays confirmed positioning in good position both AP and lateral direction.  The gliding screw was then inserted.  This was a 15 mm screw.  X-rays confirmed that was slightly long, but it was going to be switched out.  The distal pegs were then inserted.  All were smooth locking pegs.  These measured between 16 and 20 mm.  X-rays in both AP, lateral, and oblique directions revealed that none appeared to penetrate the dorsal cortex and none appeared to penetrate into the articular surface and the position was acceptable with the fracture reduction maintained.  The remaining 2 screws were then placed.  These measured 11 and 13 mm.  The 15 mm screw was positioned in the more distal screw hole.  X-rays confirmed positioning in AP  and lateral direction with good fixation.  Full pronation and supination was present.  The wound was copiously irrigated with saline.  The pronator quadratus was then repaired over the plate with figure-of-eight 3-0 Vicryl sutures, subcutaneous tissue was closed with interrupted 3-0 Vicryl, and the skin with interrupted 4-0 Vicryl Rapide.  A sterile compressive dressing, a volar splint applied. On deflation of the tourniquet, all fingers immediately pinked.  She was taken to the recovery room for observation in satisfactory condition. She will be discharged home to return in 1 week on Percocet.          ______________________________ Cindee Salt,  M.D.     GK/MEDQ  D:  11/29/2012  T:  11/30/2012  Job:  [id number]

## 2012-11-30 NOTE — Op Note (Signed)
NAME:  Rebecca [name], Rebecca [name]               ACCOUNT NO.:  [id number]  MEDICAL RECORD NO.:  [id number]  LOCATION:                               FACILITY:  [facility]  PHYSICIAN:  Emmaline Wahba, M.D.            DATE OF BIRTH:  DATE OF PROCEDURE:  11/29/2012 DATE OF DISCHARGE:  11/29/2012                              OPERATIVE REPORT   PREOPERATIVE DIAGNOSIS:  Comminuted intra-articular fracture, left distal radius.  POSTOPERATIVE DIAGNOSIS:  Comminuted intra-articular fracture, left distal radius.  OPERATION:  Open reduction and internal fixation, 3 part fracture, left distal radius.  SURGEON:  Praneeth Bussey, M.D.  ANESTHESIA:  Supraclavicular block general.  ANESTHESIOLOGIST:  Dr. Fitzgerald.  HISTORY:  The patient is a 57-year-old female who suffered a fall with a comminuted intra-articular fracture of her left distal radius.  CT scans reveal that there is an articular component to this.  She is admitted for open reduction and internal fixation.  Pre, peri, and postoperative course have been discussed along with risks and complications.  She is agreed to proceed.  She is aware that there is no guarantee with the surgery; possibility of infection; recurrence of injury to arteries, nerves, tendons, incomplete relief of symptoms, dystrophy of the preoperative area.  The patient is seen, the extremity marked by both patient and surgeon.  Antibiotic given.  PROCEDURE IN DETAIL:  The patient was brought to the operating room where a supraclavicular block general anesthetic was carried out without difficulty.  She was prepped using ChloraPrep, supine position with the left arm free.  A time-out taken confirming the patient and the procedure.  The limb was exsanguinated with an Esmarch bandage. Tourniquet was placed high and the arm was inflated to 250 mmHg.  A volar incision for volar distal radius plate was then made, carried down through subcutaneous tissue.  Bleeders were electrocauterized  with bipolar.  Dissection carried down to the flexor carpi radialis tendon sheath.  The pronator quadratus was then incised on its radial border. The brachioradialis was released.  The fracture was identified, and found to be markedly distal.  This was reduced, maintained in position. A skeletal dynamics volar plate left was selected.  This was a standard plate, 3 hole proximal.  This was placed.  The guide pin were inserted through the holes.  These were found to be slightly distal.  This was readjusted.  X-rays confirmed positioning in good position both AP and lateral direction.  The gliding screw was then inserted.  This was a 15 mm screw.  X-rays confirmed that was slightly long, but it was going to be switched out.  The distal pegs were then inserted.  All were smooth locking pegs.  These measured between 16 and 20 mm.  X-rays in both AP, lateral, and oblique directions revealed that none appeared to penetrate the dorsal cortex and none appeared to penetrate into the articular surface and the position was acceptable with the fracture reduction maintained.  The remaining 2 screws were then placed.  These measured 11 and 13 mm.  The 15 mm screw was positioned in the more distal screw hole.  X-rays confirmed positioning in AP   and lateral direction with good fixation.  Full pronation and supination was present.  The wound was copiously irrigated with saline.  The pronator quadratus was then repaired over the plate with figure-of-eight 3-0 Vicryl sutures, subcutaneous tissue was closed with interrupted 3-0 Vicryl, and the skin with interrupted 4-0 Vicryl Rapide.  A sterile compressive dressing, a volar splint applied. On deflation of the tourniquet, all fingers immediately pinked.  She was taken to the recovery room for observation in satisfactory condition. She will be discharged home to return in 1 week on Percocet.          ______________________________ Ria Redcay,  M.D.     GK/MEDQ  D:  11/29/2012  T:  11/30/2012  Job:  [id number] 

## 2012-12-04 ENCOUNTER — Encounter (HOSPITAL_BASED_OUTPATIENT_CLINIC_OR_DEPARTMENT_OTHER): Payer: Self-pay | Admitting: Orthopedic Surgery

## 2012-12-04 LAB — POCT HEMOGLOBIN-HEMACUE: Hemoglobin: 13.5 g/dL (ref 12.0–15.0)

## 2012-12-08 ENCOUNTER — Encounter: Payer: Self-pay | Admitting: Family Medicine

## 2012-12-08 ENCOUNTER — Ambulatory Visit (INDEPENDENT_AMBULATORY_CARE_PROVIDER_SITE_OTHER): Payer: BC Managed Care – PPO | Admitting: Family Medicine

## 2012-12-08 VITALS — BP 122/66 | HR 70 | Temp 97.9°F | Ht 66.5 in | Wt 208.8 lb

## 2012-12-08 DIAGNOSIS — H6982 Other specified disorders of Eustachian tube, left ear: Secondary | ICD-10-CM

## 2012-12-08 DIAGNOSIS — H6980 Other specified disorders of Eustachian tube, unspecified ear: Secondary | ICD-10-CM | POA: Insufficient documentation

## 2012-12-08 MED ORDER — FLUTICASONE PROPIONATE 50 MCG/ACT NA SUSP: 2.0000 | Freq: Every day | NASAL | Status: DC

## 2012-12-08 NOTE — Patient Instructions (Signed)
I think your ear pain is from sinus congestion (eustacian tube dysfunction)  Use the flonase spray nasal - for 2-4 weeks to open up sinuses If no improvement or if worse at any time call - or if fever   Good luck healing up from your surgery

## 2012-12-08 NOTE — Progress Notes (Signed)
Subjective:    Patient ID: Rebecca [name], female    DOB: 01-17-1955, 57 y.o.   MRN: [id number]  HPI Here for ear pain L side  A week or more - and the pain radiates under the ear to the neck  Was throbbing at one point  No tinnitus   A little nasal congested  Not severe No sinus pain    Had a recent L arm fracture - with surgery- slow recovery  Still waiting to get her stitches out  Pain is tolerable-no longer on much pain medicine   Last visit - Dr Para March noticed a heart M No cp or sob or palpitations   Lab Results  Component Value Date   WBC 3.7* 11/14/2012   HGB 13.5 11/29/2012   HCT 39.5 11/14/2012   MCV 91.7 11/14/2012   PLT 306.0 11/14/2012     Patient Active Problem List   Diagnosis Date Noted  . Distal radius fracture, left 11/23/2012  . Undiagnosed cardiac murmurs 11/23/2012  . Foot arch pain 11/20/2012  . Right leg pain 03/27/2012  . Bowel incontinence 09/28/2011  . Obesity 09/14/2011  . Family history of colon cancer requiring screening colonoscopy 09/14/2011  . Routine general medical examination at a health care facility 09/05/2011  . Mixed incontinence urge and stress 04/14/2011  . Low back pain radiating to right leg 12/28/2010  . Hemorrhoids, internal, with bleeding 10/12/2010  . HEMORRHOIDS, EXTERNAL 11/04/2009  . ALLERGIC RHINITIS 08/13/2009  . STRESS REACTION, ACUTE, WITH EMOTIONAL DISTURBANCE 06/19/2007  . HYPOKALEMIA 04/28/2007  . HYPERGLYCEMIA 11/15/2006  . 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  . BUNIONS, RIGHT FOOT 07/19/2006  . 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 HYPERTENSION 07/06/2006  . URINARY INCONTINENCE, MIXED 07/06/2006   Past Medical History  Diagnosis Date  . Hemorrhoids   . Heart murmur     noted on PCP exam 11/23/2012; "does not appear urgent"  . Hypertension     under control with meds., has been on med. > 20 yr.  . Radius fracture 11/22/2012    left, comminuted   Past Surgical History  Procedure Laterality Date  . Partial  hysterectomy    . Endometrial ablation    . Colonoscopy  03/2006  . Colonoscopy with propofol  11/15/2011  . Abdominal hysterectomy  06/02/2000    partial  . Open reduction internal fixation (orif) distal radial fracture Left 11/29/2012    Procedure: OPEN REDUCTION INTERNAL FIXATION (ORIF) LEFT  DISTAL RADIAL FRACTURE;  Surgeon: Nicki Reaper, MD;  Location: [facility];  Service: Orthopedics;  Laterality: Left;   History  Substance Use Topics  . Smoking status: Never Smoker   . Smokeless tobacco: Never Used  . Alcohol Use: No   Family History  Problem Relation Age of Onset  . Hypertension Father   . Coronary artery disease Father     with CABG  . Hypertension Mother   . Colon cancer Mother   . Diabetes Mother   . Breast cancer Maternal Aunt   . Diabetes Maternal Aunt   . Colon cancer Maternal Uncle   . Colon cancer Paternal Uncle    Allergies  Allergen Reactions  . Amoxicillin Hives and Swelling  . Spironolactone Diarrhea  . Augmentin [Amoxicillin-Pot Clavulanate] Diarrhea    Tore stomach up   Current Outpatient Prescriptions on File Prior to Visit  Medication Sig Dispense Refill  . amLODipine-benazepril (LOTREL) 5-20 MG per capsule Take 1 capsule by mouth daily.  30 capsule  12  .  ANUCORT-HC 25 MG suppository UNWRAP AND INSERT ONE SUPPOSITORY RECTALLY EVERY NIGHT AT BEDTIME AS NEEDED FOR HEMORRHOIDS  12 suppository  0  . hydrochlorothiazide (HYDRODIURIL) 50 MG tablet Take 1 tablet (50 mg total) by mouth daily.  30 tablet  11  . hydrocortisone (ANUSOL-HC) 2.5 % rectal cream Apply to affected area on hemorrhoids once daily as needed  30 g  1  . Multiple Vitamin (MULTIVITAMIN) tablet Take 1 tablet by mouth daily.        . Omega-3 Fatty Acids (FISH OIL CONCENTRATE PO) Take 1 capsule by mouth daily.       Marland Kitchen oxyCODONE-acetaminophen (PERCOCET) 10-325 MG per tablet Take 1 tablet by mouth every 4 (four) hours as needed for pain.  30 tablet  0   No current  facility-administered medications on file prior to visit.     Review of Systems Review of Systems  Constitutional: Negative for fever, appetite change, fatigue and unexpected weight change.  ENT pos for some congestion and rhinorrhea , neg for sinus pain or ST, neg for hearing loss  Eyes: Negative for pain and visual disturbance.  Respiratory: Negative for cough and shortness of breath.   Cardiovascular: Negative for cp or palpitations    Gastrointestinal: Negative for nausea, diarrhea and constipation.  Genitourinary: Negative for urgency and frequency.  Skin: Negative for pallor or rash   Neurological: Negative for weakness, light-headedness, numbness and headaches.  Hematological: Negative for adenopathy. Does not bruise/bleed easily.  Psychiatric/Behavioral: Negative for dysphoric mood. The patient is not nervous/anxious.         Objective:   Physical Exam  Constitutional: She appears well-developed and well-nourished. No distress.  HENT:  Head: Normocephalic and atraumatic.  Right Ear: External ear normal.  Mouth/Throat: Oropharynx is clear and moist.  Nares are injected and congested   Some clear rhinorrhea L TM is retracted with clear canal R TM is nl   Eyes: Conjunctivae and EOM are normal. Pupils are equal, round, and reactive to light. Right eye exhibits no discharge. Left eye exhibits no discharge.  Neck: Normal range of motion. Neck supple.  Cardiovascular: Normal rate and regular rhythm.   Murmur heard. 1/6 systolic M heard over RSB today  Pulmonary/Chest: Effort normal and breath sounds normal. No respiratory distress. She has no wheezes. She has no rales.  Musculoskeletal:  L arm in splint   Lymphadenopathy:    She has no cervical adenopathy.  Neurological: She is alert.  Skin: Skin is warm and dry.          Assessment & Plan:

## 2012-12-08 NOTE — Progress Notes (Signed)
Pre-visit discussion using our clinic review tool. No additional management support is needed unless otherwise documented below in the visit note.  

## 2012-12-10 NOTE — Assessment & Plan Note (Signed)
Trial of flonase nasal spray to help congestion and swelling of ET Antihistamine if needed Update if not starting to improve in a week or if worsening

## 2012-12-11 ENCOUNTER — Ambulatory Visit: Payer: BC Managed Care – PPO | Admitting: Family Medicine

## 2013-01-09 ENCOUNTER — Ambulatory Visit (INDEPENDENT_AMBULATORY_CARE_PROVIDER_SITE_OTHER): Payer: BC Managed Care – PPO | Admitting: Podiatry

## 2013-01-09 ENCOUNTER — Ambulatory Visit (INDEPENDENT_AMBULATORY_CARE_PROVIDER_SITE_OTHER): Payer: BC Managed Care – PPO

## 2013-01-09 ENCOUNTER — Encounter: Payer: Self-pay | Admitting: Podiatry

## 2013-01-09 VITALS — BP 120/76 | HR 76 | Resp 12 | Ht 66.0 in | Wt 207.0 lb

## 2013-01-09 DIAGNOSIS — R52 Pain, unspecified: Secondary | ICD-10-CM

## 2013-01-09 DIAGNOSIS — M722 Plantar fascial fibromatosis: Secondary | ICD-10-CM

## 2013-01-09 NOTE — Progress Notes (Signed)
   Subjective:    Patient ID: Rebecca [name], female    DOB: 06-Aug-1955, 58 y.o.   MRN: [id number]  HPI Comments: '' LT FOOT HAVE KNOTS UNDER THE ARCH AND THEY BEEN HURTING FOR AT LEAST 1 YEAR. TRIED NO TREATMENT.  Foot Pain      Review of Systems  All other systems reviewed and are negative.       Objective:   Physical Exam: I have reviewed her past medical history medications allergies surgeries social history and review of systems. Vital signs are stable she is alert and oriented x3. Pulses are strongly palpable and capillary fill time to digits one through 5 bilateral is immediate. Neurologic sensorium is intact bilateral. Deep tendon reflexes are intact bilateral. Muscle strength is 5 over 5 dorsiflexors plantar flexors inverters everters all intrinsic musculature is intact. Orthopedic evaluation demonstrates all joints distal to the ankle a full range of motion without crepitation. Cutaneous evaluation demonstrates supple well hydrated cutis with firm nonpulsatile nodules x3 to the plantar aspect of the medial longitudinal plantar fascial ligament. Radiographic evaluation does not demonstrate any type of spiculation or calcification within this ligament limiting is to think that this is more of an acute fibrotic development.        Assessment & Plan:  Assessment: Plantar fibromatosis left foot x3 lesions.  Plan: We discussed the etiology pathology conservative versus surgical therapies today. I injected 20 mg of Kenalog and local anesthetic to these lesions. I will followup with her in 6 weeks for reevaluation

## 2013-01-19 ENCOUNTER — Ambulatory Visit: Payer: BC Managed Care – PPO | Admitting: Family Medicine

## 2013-02-22 ENCOUNTER — Ambulatory Visit: Payer: BC Managed Care – PPO | Admitting: Podiatry

## 2013-03-07 ENCOUNTER — Encounter: Payer: Self-pay | Admitting: Family Medicine

## 2013-03-07 ENCOUNTER — Ambulatory Visit (INDEPENDENT_AMBULATORY_CARE_PROVIDER_SITE_OTHER): Payer: BC Managed Care – PPO | Admitting: Family Medicine

## 2013-03-07 VITALS — BP 126/78 | HR 66 | Temp 98.3°F | Ht 66.0 in | Wt 211.8 lb

## 2013-03-07 DIAGNOSIS — T7431XA Adult psychological abuse, confirmed, initial encounter: Secondary | ICD-10-CM

## 2013-03-07 DIAGNOSIS — F430 Acute stress reaction: Secondary | ICD-10-CM
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
## 2013-03-07 NOTE — Patient Instructions (Signed)
Stop up front for counseling referral  Here is a work note for a week for stress reaction Good luck with everything  Make an appointment with staffing as soon as you can to make a plan for work

## 2013-03-07 NOTE — Progress Notes (Signed)
Pre visit review using our clinic review tool, if applicable. No additional management support is needed unless otherwise documented below in the visit note. 
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
## 2013-03-09 ENCOUNTER — Ambulatory Visit (INDEPENDENT_AMBULATORY_CARE_PROVIDER_SITE_OTHER): Payer: BC Managed Care – PPO | Admitting: Podiatrist

## 2013-03-09 ENCOUNTER — Encounter: Payer: Self-pay | Admitting: Podiatrist

## 2013-03-09 ENCOUNTER — Ambulatory Visit (INDEPENDENT_AMBULATORY_CARE_PROVIDER_SITE_OTHER): Payer: BC Managed Care – PPO

## 2013-03-09 ENCOUNTER — Ambulatory Visit: Payer: BC Managed Care – PPO

## 2013-03-09 VITALS — BP 137/77 | HR 67 | Resp 18

## 2013-03-09 DIAGNOSIS — M21619 Bunion of unspecified foot: Secondary | ICD-10-CM

## 2013-03-09 DIAGNOSIS — M722 Plantar fascial fibromatosis: Secondary | ICD-10-CM

## 2013-03-09 NOTE — Patient Instructions (Signed)
Bunion Care  A bunion is a boney protrusion at the base of your big toe (metatarsal-phalangeal joint). This problem, if painful or troublesome can be corrected with surgery. This is an elective surgery, so you can pick a convenient time for the procedure. The surgery may:  · Improve appearance (cosmetic).  · Relieve pain.  · Improve function.  Your foot is made up of a complex set of twenty-six bones which are held together by tough fibrous ligaments. The movement of the foot is controlled by muscles in the foot and leg. These muscles attach to the foot by cord like structures (tendons) that attach muscle to bone.  If surgery is recommended, your caregiver will explain your foot problem and how surgery can improve it. Your caregiver can answer questions you may have about the potential risks and complications involved. After determining that foot surgery is necessary to correct your problem, you can proceed with plans for the surgery.  LET YOUR CAREGIVER KNOW ABOUT:   · Previous problems with anesthetics or medicines used to numb the skin.  · Allergies to dyes, iodine, foods, and/or latex.  · Medicines taken including herbs, eye drops, prescription medicines (especially medicines used to "thin the blood"), aspirin and other over-the-counter medicines, and steroids (by mouth or as a cream).  · History of bleeding or blood problems.  · Possibility of pregnancy, if this applies.  · History of blood clots in your legs and/or lungs.  · Previous surgery.  · Other important health problems.  Let your caregiver know about health changes prior to surgery.  BEFORE THE PROCEDURE   You should be present 60 minutes prior to your procedure or as directed.   PROCEDURE  BUNION TYPES AND THEIR TREATMENTS  · Positional Bunion. A positional bunion develops when a bony growth on the side of the metatarsal bone enlarges the joint. The metatarsal forces the joint capsule to stretch over it. As this growth pushes the big toe toward the  others, the tendons on the inside tighten. This forces the big toe farther out of alignment. The bunion presses against the shoe, irritating the skin and causes further pain and disability.  ¨ Positional Bunionectomy Treatment. The bunion is removed. Tight tendons may be released.  ¨ Follow-up Care. Your toe is apt to be stiff at first but will loosen up as you move it. You may need to wear a special surgical shoe and, possibly, a splint for about three weeks.  · Mild Structural Bunion. Structural bunions occur when the angle between the first and second metatarsal bones increases to a point where it is greater than normal. The increased angle of the metatarsals makes the big toe slant toward the other toes. Sometimes bony growths may form. Irritation and swelling often follow.  ¨ Structural Bunionectomy Treatment. Your caregiver surgically repositions the bone by decreasing the angle and may use a fixation device to hold it together. The bunion (bump) is also removed.  · Degenerative Joint Disease (Arthritis). When wear-and-tear arthritis (osteoarthritis) of aging affects the big toe joint, pain and reduced joint motion may result. This is not a true bunion but may be associated with bunions. Left untreated, it can increase wear and tear in the joint and break down the cartilage. Pain and stiffness are problems of both wear-and-tear arthritis and rheumatoid arthritis.  · Arthroplasty With Joint Implantation As Treatment. The bunion is first removed; then the degenerated joint is removed and replaced with an implant.  AFTER THE PROCEDURE     is back to normal strength along with a return of nearly normal function. It is best to do elective surgeries when your health is optimal.  After surgery, you will be taken to the recovery area where a nurse will watch and  check your progress. Once you are awake, stable, and taking fluids well, barring other problems you will be allowed to go home. HOME CARE INSTRUCTIONS  Be sure to ask your caregiver how long you will be off your feet and home from work. Plan accordingly. There are several types of bunions and varying surgical treatments for each. Common types are explained above. Your surgery may be similar and may include a fixation device (such as a small screw). Your foot and ankle may be immobilized by a cast (from your toes to below your knee). You may be asked not to bear weight on this foot for a few weeks or until comfortable. Once home, an ice pack applied to your operative site may help with discomfort and keep the swelling down. You may be able to walk a day or two after surgery. Your podiatrist may prescribe a splint or a special shoe to be worn for several weeks. Only take over-the-counter or prescription medicines for pain, discomfort, or fever as directed by your caregiver.  SEEK MEDICAL CARE IF:   There is increased bleeding (more than a small spot) from the surgical site.  You notice redness, swelling, or increasing pain in the surgical site.  Pus is coming from the site.  An unexplained oral temperature above 102 F (38.9 C) develops.  You notice a foul smell coming from the surgical site or dressing. SEEK IMMEDIATE MEDICAL CARE IF:  You develop a rash, have difficulty breathing, or have any allergic problems with medications. Document Released: 12/19/1999 Document Revised: 03/15/2011 Document Reviewed: 12/25/2007 ExitCare Patient Information 2014 [employer].  

## 2013-03-09 NOTE — Progress Notes (Deleted)
Dr Milinda Pointer did a cortisone shot in my arch area on my left foot and felt ok but not much difference and dry and peeling and been going on about a month ago and never had dried feet and talk about this bunion on my left foot

## 2013-03-09 NOTE — Progress Notes (Signed)
   Chief Complaint  Patient presents with  . Foot Problem    left foot     HPI: Patient is 58 y.o. female who presents today for follow up of plantar fibroma left foot.  Patient received an injection courtesy of Dr. Milinda Pointer at her last visit and she relates she hasn't noticed much difference.  She relates that there is some dryness and peeling near the areas of the fibromas. The patient also has a painful bunion on her right foot it would like to discuss her options at today's visit.     Physical Exam GENERAL APPEARANCE: Alert, conversant. Appropriately groomed. No acute distress.  VASCULAR: Pedal pulses palpable at 2/4 DP and PT bilateral.  Capillary refill time is immediate to all digits,  Proximal to distal cooling it warm to warm.  Digital hair growth is present bilateral  NEUROLOGIC: sensation is intact epicritically and protectively to 5.07 monofilament at 5/5 sites bilateral.  Light touch is intact bilateral, vibratory sensation intact bilateral, achilles tendon reflex is intact bilateral.  MUSCULOSKELETAL: Bunion deformity is present right foot. Range of motion is within acceptable limits discomfort with direct medial pressure is noted and at end range of motion.  DERMATOLOGIC: 3 separate plantar fibroma lesions are present on the plantar aspect of the left foot. There is a scant amount of peeling overlying the lesions possibly due to the steroid injections. The patient denies any itchiness, drainage, pain or tenderness.  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  Assessment: Plantar fibroma x3 left foot, bunion right foot  Plan: Discussed continuing the steroid injections as well as a topical compounding cream consisting of verapamil 10% and diclofenac 3% and the patient would like to try compounding cream and see how that works before going forward with another injection. I will order the compounding cream for her through  [facility].  We also discussed her bunion and conservative versus surgical measures to treat it. We discussed an [facility] bunion correction with screw or pin fixation as well as postoperative course. She will consider her options for the bunion surgery and will call if she would like to proceed. Otherwise she'll be seen back as needed for followup.

## 2013-03-12 NOTE — Progress Notes (Signed)
Dr Valentina Lucks ordered [facility] Tendinosis - Strictures - Scarring cream containing Baclofen 2%, Diclofenac 10%, Verapamil !0% dispense 240gm with 5 refills for plantar Fibromatosis.  Faxed to [employer] [phone].

## 2013-03-16 ENCOUNTER — Other Ambulatory Visit: Payer: Self-pay | Admitting: Family Medicine

## 2013-03-16 DIAGNOSIS — Z1231 Encounter for screening mammogram for malignant neoplasm of breast: Secondary | ICD-10-CM

## 2013-03-20 ENCOUNTER — Ambulatory Visit: Payer: BC Managed Care – PPO | Admitting: Podiatry

## 2013-03-20 ENCOUNTER — Telehealth: Payer: Self-pay | Admitting: *Deleted

## 2013-03-20 NOTE — Telephone Encounter (Signed)
I want to schedule surgery with Dr.Egerton for a Bunion.  I returned her call.  She is requesting surgery on 04/11/13.  She has a consult scheduled for 03/29/13.

## 2013-03-26 DIAGNOSIS — M21619 Bunion of unspecified foot: Secondary | ICD-10-CM

## 2013-03-26 DIAGNOSIS — M722 Plantar fascial fibromatosis: Secondary | ICD-10-CM

## 2013-03-29 ENCOUNTER — Ambulatory Visit: Payer: BC Managed Care – PPO | Admitting: Podiatry

## 2013-03-30 ENCOUNTER — Ambulatory Visit (INDEPENDENT_AMBULATORY_CARE_PROVIDER_SITE_OTHER): Payer: BC Managed Care – PPO | Admitting: Podiatrist

## 2013-03-30 ENCOUNTER — Encounter: Payer: Self-pay | Admitting: Podiatrist

## 2013-03-30 VITALS — BP 114/76 | HR 70 | Resp 12

## 2013-03-30 DIAGNOSIS — M722 Plantar fascial fibromatosis: Secondary | ICD-10-CM

## 2013-03-30 DIAGNOSIS — M21619 Bunion of unspecified foot: Secondary | ICD-10-CM

## 2013-03-30 NOTE — Patient Instructions (Signed)
Pre-Operative Instructions  Congratulations, you have decided to take an important step to improving your quality of life.  You can be assured that the doctors of [facility] will be with you every step of the way.  1. Plan to be at the surgery center/hospital at least 1 (one) hour prior to your scheduled time unless otherwise directed by the surgical center/hospital staff.  You must have a responsible adult accompany you, remain during the surgery and drive you home.  Make sure you have directions to the surgical center/hospital and know how to get there on time. 2. For hospital based surgery you will need to obtain a history and physical form from your family physician within 1 month prior to the date of surgery- we will give you a form for you primary physician.  3. We make every effort to accommodate the date you request for surgery.  There are however, times where surgery dates or times have to be moved.  We will contact you as soon as possible if a change in schedule is required.   4. No Aspirin/Ibuprofen for one week before surgery.  If you are on aspirin, any non-steroidal anti-inflammatory medications (Mobic, Aleve, Ibuprofen) you should stop taking it 7 days prior to your surgery.  You make take Tylenol  For pain prior to surgery.  5. Medications- If you are taking daily heart and blood pressure medications, seizure, reflux, allergy, asthma, anxiety, pain or diabetes medications, make sure the surgery center/hospital is aware before the day of surgery so they may notify you which medications to take or avoid the day of surgery. 6. No food or drink after midnight the night before surgery unless directed otherwise by surgical center/hospital staff. 7. No alcoholic beverages 24 hours prior to surgery.  No smoking 24 hours prior to or 24 hours after surgery. 8. Wear loose pants or shorts- loose enough to fit over bandages, boots, and casts. 9. No slip on shoes, sneakers are best. 10. Bring  your boot with you to the surgery center/hospital.  Also bring crutches or a walker if your physician has prescribed it for you.  If you do not have this equipment, it will be provided for you after surgery. 11. If you have not been contracted by the surgery center/hospital by the day before your surgery, call to confirm the date and time of your surgery. 12. Leave-time from work may vary depending on the type of surgery you have.  Appropriate arrangements should be made prior to surgery with your employer. 13. Prescriptions will be provided immediately following surgery by your doctor.  Have these filled as soon as possible after surgery and take the medication as directed. 14. Remove nail polish on the operative foot. 15. Wash the night before surgery.  The night before surgery wash the foot and leg well with the antibacterial soap provided and water paying special attention to beneath the toenails and in between the toes.  Rinse thoroughly with water and dry well with a towel.  Perform this wash unless told not to do so by your physician.  Enclosed: 1 Ice pack (please put in freezer the night before surgery)   1 Hibiclens skin cleaner   Pre-op Instructions  If you have any questions regarding the instructions, do not hesitate to call our office.  [place]: [address] [place], [place] [address] [phone]  [place]: [address] [place], [place] [address] [phone]  [place]: [address]  [place], [place] [address] [phone]  Dr. Richard   Tuchman DPM, Dr. Norman Regal DPM Dr. Richard Sikora DPM, Dr. M. Todd Hyatt DPM, Dr. Currie Dennin DPM 

## 2013-03-30 NOTE — Progress Notes (Signed)
  Subjective: Rebecca [name] presents today for discussion of surgical consult for her right foot bunion. She also has plantar fibroma tumors on the left foot that are bothersome as well. She would like to consider surgery on the if possible as well. She has no change in past medical history, medications, allergies since her last visit. She has a painful bunion deformity on the right and she has tried conservative treatments which have all failed. She's also tried steroid injections to the left foot which has failed to relieve her pain. She is seeking surgical correction and recommendations today.  Objective: Vascular status is intact bilaterally with pedal pulses palpable at 2/4 DP and PT bilateral. Neurological sensation is intact bilateral. Mild bunion deformity is present on the right foot with increase in first intermetatarsal angle and discomfort with direct pressure on the medial head of the first metatarsal. Plantar fibroma lesions x3 are present on the plantar aspect of the left foot which are also painful and symptomatic.  Assessment: Bunion right foot, plantar fibroma left foot  Plan: Discussed conservative versus surgical options. Recommended a [place] bunion correction with screw fixation as well as excision of plantar fibroma left foot. The consent form was discussed and all three pages were signed and the patient's questions were encouraged and answered to the best of my ability. Risks of the surgery were discussed including but not limited to continued pain, infection, swelling, elevated toe, decreased range of motion,  Or suture or implant reaction. Preoperative instructions were also dispensed to the patient as well as a preoperative surgical pamphlet to go along with the instructions. Surgery will be scheduled at the patients convenience and patient will be seen at [facility] specialty surgery center on outpatient basis. If  any questions or concerns prior to her surgical date She is instructed to  call.

## 2013-04-11 ENCOUNTER — Ambulatory Visit (HOSPITAL_COMMUNITY): Payer: BC Managed Care – PPO

## 2013-04-11 ENCOUNTER — Encounter: Payer: Self-pay | Admitting: Podiatrist

## 2013-04-11 DIAGNOSIS — D492 Neoplasm of unspecified behavior of bone, soft tissue, and skin: Secondary | ICD-10-CM

## 2013-04-11 DIAGNOSIS — M2010 Hallux valgus (acquired), unspecified foot: Secondary | ICD-10-CM

## 2013-04-11 NOTE — Progress Notes (Signed)
Dr Valentina Lucks performed - correction of bunion right foot by shifting and shaving bone, and holding in correct position with screw fixation                                     - removal of plantar fibroma bottom of left foot. Dr Valentina Lucks ordered - Oxycodone/APAP 5/325 #60 1 or 2 tablets every 4 to 6 hours prn pain, Phenergan 25mg  #20 1 tablet every 6 hours prn nausea and vomiting, and Clindamycin 300mg  #15 1 tablet tid.

## 2013-04-12 ENCOUNTER — Telehealth: Payer: Self-pay | Admitting: *Deleted

## 2013-04-12 NOTE — Telephone Encounter (Signed)
I have a question about my surgery that I had yesterday.  I noticed some dry blood.  Is that something I should be concerned with?  I told her no, that's normal.  It says to elevate for 5-7 days, does that mean all the time?  I advised her to elevate as much as possible.  She asked if it was okay to take the shoes off as long as she's not up on them.  I informed her yes.  She asked if she could go to church on Sunday.  I advised against it, reiterated to elevate as much as possible.  The more up on it, it's going to swell and then it can become painful.  She stated that's all she needed to know.

## 2013-04-18 ENCOUNTER — Ambulatory Visit (INDEPENDENT_AMBULATORY_CARE_PROVIDER_SITE_OTHER): Payer: BC Managed Care – PPO

## 2013-04-18 ENCOUNTER — Ambulatory Visit (INDEPENDENT_AMBULATORY_CARE_PROVIDER_SITE_OTHER): Payer: BC Managed Care – PPO | Admitting: Podiatrist

## 2013-04-18 ENCOUNTER — Encounter: Payer: Self-pay | Admitting: Podiatrist

## 2013-04-18 VITALS — BP 132/87 | HR 80 | Resp 12

## 2013-04-18 DIAGNOSIS — Z9889 Other specified postprocedural states: Secondary | ICD-10-CM

## 2013-04-18 DIAGNOSIS — M722 Plantar fascial fibromatosis: Secondary | ICD-10-CM

## 2013-04-18 DIAGNOSIS — M21619 Bunion of unspecified foot: Secondary | ICD-10-CM

## 2013-04-18 NOTE — Patient Instructions (Signed)
Keep your dressings intact and your feet dry just like last week-- you are doing great!!  I will see you next week and we will get some sutures out!

## 2013-04-18 NOTE — Progress Notes (Signed)
Subjective: Patient presents today1 week status post foot surgery of the both feet.  Date of surgery 04-11-13.  Surgery consisted of an Austin bunon repair right and removal plantar fibroma left. Patient denies nausea, vomiting, fevers, chills or night sweats.  Denies calf pain or tenderness to the operative side. She relates she has had very minimal pain and overall is pleased with her progress.  Objective:  Neurovascular status is intact with palpable pedal pulses DP and PT bilateral at 2+ out of 4. Neurological sensation is intact and unchanged as per prior to surgery. Incision sites are well coapted with sutures in place.  Plantar incision left is slightly dehisced in the central area of the incision.  Fibromas have been removed.  Bunion on the right foot still appears swollen over the medial eminence.  xrays show good correction and swelling as seen clinically  Assessment: Status post right austin bunion correction and removal plantar fibroma left  Plan:  Redressed the feet with dry sterile dressing bilateral.  Recommended continued use of darco shoes bilateral.  She will continue to keep the feet elevated and dressings clean dry and intact.  We will remove the suture ends of the right incisions next week.  Will keep an eye on plantar incision-- may be removed next week or in 2 weeks depending upon appearance. She will call if she has any concerns.

## 2013-04-19 ENCOUNTER — Ambulatory Visit (HOSPITAL_COMMUNITY)
Admission: RE | Admit: 2013-04-19 | Discharge: 2013-04-19 | Disposition: A | Discharge status: Home / Self Care | Payer: BC Managed Care – PPO | Source: Ambulatory Visit | Attending: Family Medicine | Admitting: Family Medicine

## 2013-04-19 DIAGNOSIS — Z1231 Encounter for screening mammogram for malignant neoplasm of breast: Secondary | ICD-10-CM | POA: Insufficient documentation

## 2013-04-27 ENCOUNTER — Encounter: Payer: Self-pay | Admitting: Podiatrist

## 2013-04-27 ENCOUNTER — Ambulatory Visit (INDEPENDENT_AMBULATORY_CARE_PROVIDER_SITE_OTHER): Payer: BC Managed Care – PPO | Admitting: Podiatrist

## 2013-04-27 VITALS — BP 128/87 | HR 62 | Resp 16

## 2013-04-27 DIAGNOSIS — Z9889 Other specified postprocedural states: Secondary | ICD-10-CM

## 2013-04-27 DIAGNOSIS — M722 Plantar fascial fibromatosis: Secondary | ICD-10-CM

## 2013-04-27 DIAGNOSIS — M21619 Bunion of unspecified foot: Secondary | ICD-10-CM

## 2013-05-03 ENCOUNTER — Ambulatory Visit (HOSPITAL_COMMUNITY): Payer: BC Managed Care – PPO

## 2013-05-04 NOTE — Progress Notes (Signed)
Subjective: Patient presents today 2 weeks status post foot surgery of the both feet. Date of surgery 04-11-13. Surgery consisted of an Austin bunon repair right and removal plantar fibroma left. Patient denies nausea, vomiting, fevers, chills or night sweats. Denies calf pain or tenderness to the operative side. She relates she has had very minimal pain and overall is pleased with her progress.   Objective: Neurovascular status is intact with palpable pedal pulses DP and PT bilateral at 2+ out of 4. Neurological sensation is intact and unchanged as per prior to surgery. Incision sites are well coapted with sutures in place. Plantar incision left is  No longer dehisced in the central area of the incision. Fibromas have been removed. Bunion on the right foot still appears swollen over the medial eminence.    Assessment: Status post right austin bunion correction and removal plantar fibroma left   Plan:removed the suturet today.  Recommended continued use of darco shoes bilateral.  She will be seen back in 2 weeks for her 1 month appointment.  She will call if she has any concerns.

## 2013-05-17 ENCOUNTER — Other Ambulatory Visit (INDEPENDENT_AMBULATORY_CARE_PROVIDER_SITE_OTHER): Payer: BC Managed Care – PPO

## 2013-05-17 DIAGNOSIS — R7309 Other abnormal glucose: Secondary | ICD-10-CM

## 2013-05-17 DIAGNOSIS — I10 Essential (primary) hypertension: Secondary | ICD-10-CM

## 2013-05-17 LAB — LIPID PANEL
Cholesterol: 222 mg/dL — ABNORMAL HIGH (ref 0–200)
HDL: 66 mg/dL (ref >39.00)
LDL Cholesterol: 137 mg/dL — ABNORMAL HIGH (ref 0–99)
Total CHOL/HDL Ratio: 3
Triglycerides: 94 mg/dL (ref 0.0–149.0)
VLDL: 18.8 mg/dL (ref 0.0–40.0)

## 2013-05-17 LAB — HEMOGLOBIN A1C: Hgb A1c MFr Bld: 6.2 % (ref 4.6–6.5)

## 2013-05-18 ENCOUNTER — Encounter: Payer: Self-pay | Admitting: Podiatrist

## 2013-05-18 ENCOUNTER — Ambulatory Visit (INDEPENDENT_AMBULATORY_CARE_PROVIDER_SITE_OTHER): Payer: BC Managed Care – PPO | Admitting: Podiatrist

## 2013-05-18 ENCOUNTER — Ambulatory Visit (INDEPENDENT_AMBULATORY_CARE_PROVIDER_SITE_OTHER): Payer: BC Managed Care – PPO

## 2013-05-18 VITALS — BP 125/81 | HR 77 | Resp 15 | Ht 67.0 in | Wt 212.0 lb

## 2013-05-18 DIAGNOSIS — M2010 Hallux valgus (acquired), unspecified foot: Secondary | ICD-10-CM

## 2013-05-18 DIAGNOSIS — Z9889 Other specified postprocedural states: Secondary | ICD-10-CM

## 2013-05-18 DIAGNOSIS — M21619 Bunion of unspecified foot: Secondary | ICD-10-CM

## 2013-05-18 DIAGNOSIS — M722 Plantar fascial fibromatosis: Secondary | ICD-10-CM

## 2013-05-18 NOTE — Progress Notes (Signed)
Subjective: Patient presents today 4 weeks status post foot surgery of the both feet. Date of surgery 04-11-13. Surgery consisted of an Austin bunon repair right and removal plantar fibroma left. Patient denies nausea, vomiting, fevers, chills or night sweats. Denies calf pain or tenderness to the operative side. She relates she has had very minimal pain and overall is pleased with her progress.  She is still doing well and relates no pain in either foot   Objective: Neurovascular status is intact with palpable pedal pulses DP and PT bilateral at 2+ out of 4. Neurological sensation is intact and unchanged as per prior to surgery. Incision sites are well coapted with sutures in place. Incision sites are healed up completely.  Overall incisions look great.   Fibromas have been removed left foot.  Mild scar tissue is still present but not painful to the patient. Bunion on the right foot still appears swollen over the medial eminence. xrays show it is healing nicely.  Assessment: Status post right austin bunion correction and removal plantar fibroma left   Plan recommended use of a good supportive shoe on bilateral feet. She may start walking again. Recommended continuing range of motion exercises at the first metatarsophalangeal joint right. Also recommended good massages into the arch of her left foot. She'll be seen back in a month for followup.

## 2013-05-18 NOTE — Patient Instructions (Signed)
You are free to wear comfortable shoes, tennis shoes, walking shoes, etc.  Call if you have any ?'s or concerns

## 2013-05-21 ENCOUNTER — Encounter: Payer: Self-pay | Admitting: Podiatrist

## 2013-05-25 ENCOUNTER — Ambulatory Visit (INDEPENDENT_AMBULATORY_CARE_PROVIDER_SITE_OTHER): Payer: BC Managed Care – PPO | Admitting: Family Medicine

## 2013-05-25 ENCOUNTER — Encounter: Payer: Self-pay | Admitting: Family Medicine

## 2013-05-25 VITALS — BP 132/78 | HR 74 | Temp 98.0°F | Ht 67.0 in | Wt 217.5 lb

## 2013-05-25 DIAGNOSIS — R7309 Other abnormal glucose: Secondary | ICD-10-CM

## 2013-05-25 DIAGNOSIS — I10 Essential (primary) hypertension: Secondary | ICD-10-CM

## 2013-05-25 DIAGNOSIS — F430 Acute stress reaction: Secondary | ICD-10-CM

## 2013-05-25 NOTE — Patient Instructions (Signed)
Have a good summer and concentrate on taking care of yourself  Watch diet for glucose -to prevent diabetes (and work on weight loss) Also cholesterol   (Avoid red meat/ fried foods/ egg yolks/ fatty breakfast meats/ butter, cheese and high fat dairy/ and shellfish) Follow up in 6 months for annual exam with labs prior    Fat and Cholesterol Control Diet Fat and cholesterol levels in your blood and organs are influenced by your diet. High levels of fat and cholesterol may lead to diseases of the heart, small and large blood vessels, gallbladder, liver, and pancreas. CONTROLLING FAT AND CHOLESTEROL WITH DIET Although exercise and lifestyle factors are important, your diet is key. That is because certain foods are known to raise cholesterol and others to lower it. The goal is to balance foods for their effect on cholesterol and more importantly, to replace saturated and trans fat with other types of fat, such as monounsaturated fat, polyunsaturated fat, and omega-3 fatty acids. On average, a person should consume no more than 15 to 17 g of saturated fat daily. Saturated and trans fats are considered "bad" fats, and they will raise LDL cholesterol. Saturated fats are primarily found in animal products such as meats, butter, and cream. However, that does not mean you need to give up all your favorite foods. Today, there are good tasting, low-fat, low-cholesterol substitutes for most of the things you like to eat. Choose low-fat or nonfat alternatives. Choose round or loin cuts of red meat. These types of cuts are lowest in fat and cholesterol. Chicken (without the skin), fish, veal, and ground [place] breast are great choices. Eliminate fatty meats, such as hot dogs and salami. Even shellfish have little or no saturated fat. Have a 3 oz (85 g) portion when you eat lean meat, poultry, or fish. Trans fats are also called "partially hydrogenated oils." They are oils that have been scientifically manipulated so that  they are solid at room temperature resulting in a longer shelf life and improved taste and texture of foods in which they are added. Trans fats are found in stick margarine, some tub margarines, cookies, crackers, and baked goods.  When baking and cooking, oils are a great substitute for butter. The monounsaturated oils are especially beneficial since it is believed they lower LDL and raise HDL. The oils you should avoid entirely are saturated tropical oils, such as coconut and palm.  Remember to eat a lot from food groups that are naturally free of saturated and trans fat, including fish, fruit, vegetables, beans, grains (barley, rice, couscous, bulgur wheat), and pasta (without cream sauces).  IDENTIFYING FOODS THAT LOWER FAT AND CHOLESTEROL  Soluble fiber may lower your cholesterol. This type of fiber is found in fruits such as apples, vegetables such as broccoli, potatoes, and carrots, legumes such as beans, peas, and lentils, and grains such as barley. Foods fortified with plant sterols (phytosterol) may also lower cholesterol. You should eat at least 2 g per day of these foods for a cholesterol lowering effect.  Read package labels to identify low-saturated fats, trans fat free, and low-fat foods at the supermarket. Select cheeses that have only 2 to 3 g saturated fat per ounce. Use a heart-healthy tub margarine that is free of trans fats or partially hydrogenated oil. When buying baked goods (cookies, crackers), avoid partially hydrogenated oils. Breads and muffins should be made from whole grains (whole-wheat or whole oat flour, instead of "flour" or "enriched flour"). Buy non-creamy canned soups with reduced  salt and no added fats.  FOOD PREPARATION TECHNIQUES  Never deep-fry. If you must fry, either stir-fry, which uses very little fat, or use non-stick cooking sprays. When possible, broil, bake, or roast meats, and steam vegetables. Instead of putting butter or margarine on vegetables, use lemon  and herbs, applesauce, and cinnamon (for squash and sweet potatoes). Use nonfat yogurt, salsa, and low-fat dressings for salads.  LOW-SATURATED FAT / LOW-FAT FOOD SUBSTITUTES Meats / Saturated Fat (g)  Avoid: Steak, marbled (3 oz/85 g) / 11 g  Choose: Steak, lean (3 oz/85 g) / 4 g  Avoid: Hamburger (3 oz/85 g) / 7 g  Choose: Hamburger, lean (3 oz/85 g) / 5 g  Avoid: Ham (3 oz/85 g) / 6 g  Choose: Ham, lean cut (3 oz/85 g) / 2.4 g  Avoid: Chicken, with skin, dark meat (3 oz/85 g) / 4 g  Choose: Chicken, skin removed, dark meat (3 oz/85 g) / 2 g  Avoid: Chicken, with skin, light meat (3 oz/85 g) / 2.5 g  Choose: Chicken, skin removed, light meat (3 oz/85 g) / 1 g Dairy / Saturated Fat (g)  Avoid: Whole milk (1 cup) / 5 g  Choose: Low-fat milk, 2% (1 cup) / 3 g  Choose: Low-fat milk, 1% (1 cup) / 1.5 g  Choose: Skim milk (1 cup) / 0.3 g  Avoid: Hard cheese (1 oz/28 g) / 6 g  Choose: Skim milk cheese (1 oz/28 g) / 2 to 3 g  Avoid: Cottage cheese, 4% fat (1 cup) / 6.5 g  Choose: Low-fat cottage cheese, 1% fat (1 cup) / 1.5 g  Avoid: Ice cream (1 cup) / 9 g  Choose: Sherbet (1 cup) / 2.5 g  Choose: Nonfat frozen yogurt (1 cup) / 0.3 g  Choose: Frozen fruit bar / trace  Avoid: Whipped cream (1 tbs) / 3.5 g  Choose: Nondairy whipped topping (1 tbs) / 1 g Condiments / Saturated Fat (g)  Avoid: Mayonnaise (1 tbs) / 2 g  Choose: Low-fat mayonnaise (1 tbs) / 1 g  Avoid: Butter (1 tbs) / 7 g  Choose: Extra light margarine (1 tbs) / 1 g  Avoid: Coconut oil (1 tbs) / 11.8 g  Choose: Olive oil (1 tbs) / 1.8 g  Choose: Corn oil (1 tbs) / 1.7 g  Choose: Safflower oil (1 tbs) / 1.2 g  Choose: Sunflower oil (1 tbs) / 1.4 g  Choose: Soybean oil (1 tbs) / 2.4 g  Choose: Canola oil (1 tbs) / 1 g Document Released: 12/21/2004 Document Revised: 04/17/2012 Document Reviewed: 06/11/2010 Exit[employer] Patient Information 2014 [place], [place].

## 2013-05-25 NOTE — Progress Notes (Signed)
Pre visit review using our clinic review tool, if applicable. No additional management support is needed unless otherwise documented below in the visit note. 
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
## 2013-05-27 NOTE — Assessment & Plan Note (Signed)
bp in fair control at this time  BP Readings from Last 1 Encounters:  05/25/13 132/78   No changes needed Disc lifstyle change with low sodium diet and exercise   Labs rev

## 2013-05-27 NOTE — Assessment & Plan Note (Signed)
Lab Results  Component Value Date   HGBA1C 6.2 05/17/2013   Disc imp of wt loss/ low glycemic diet and exercise to avoid DM

## 2013-05-27 NOTE — Assessment & Plan Note (Signed)
Much improved now that pt is out of her job  Doing well - does not plan on going back  Does not feel she needs counseling

## 2013-06-15 ENCOUNTER — Ambulatory Visit (INDEPENDENT_AMBULATORY_CARE_PROVIDER_SITE_OTHER): Payer: BC Managed Care – PPO

## 2013-06-15 ENCOUNTER — Encounter: Payer: Self-pay | Admitting: Podiatrist

## 2013-06-15 ENCOUNTER — Ambulatory Visit: Payer: BC Managed Care – PPO | Admitting: Podiatrist

## 2013-06-15 VITALS — BP 131/77 | HR 81 | Resp 18

## 2013-06-15 DIAGNOSIS — Z9889 Other specified postprocedural states: Secondary | ICD-10-CM

## 2013-06-15 DIAGNOSIS — M2010 Hallux valgus (acquired), unspecified foot: Secondary | ICD-10-CM

## 2013-06-15 DIAGNOSIS — Z09 Encounter for follow-up examination after completed treatment for conditions other than malignant neoplasm: Secondary | ICD-10-CM

## 2013-06-15 NOTE — Progress Notes (Signed)
I AM DOING GOOD ON THE RIGHT FOOT STILL SWELLING SOME AND ON THE LEFT FOOT IS DOING AND CAN WALK  AND SURGERY ON 4/81/15  Subjective: Patient presents today 8 weeks status post foot surgery of the both feet. Date of surgery 04-11-13. Surgery consisted of an Austin bunon repair right and removal plantar fibroma left. Patient denies nausea, vomiting, fevers, chills or night sweats. Denies calf pain or tenderness to the operative side. She relates she continues to swell on the right foot however she has had very minimal pain.  Objective: Neurovascular status is intact with palpable pedal pulses DP and PT bilateral at 2+ out of 4. Neurological sensation is intact and unchanged as per prior to surgery.  Incision sites are healed up completely. Fibromas have been removed left foot. Mild scar tissue is still present but not painful to the patient. Bunion on the right foot still appears swollen over the medial eminence. xrays show it is healing nicely.   Assessment: Status post right austin bunion correction and removal plantar fibroma left   Plan: Recommended compression anklet. recommended use of a good supportive shoe on bilateral feet as she has been wearing sandals due to the swelling on her right foot. She may start walking again. Recommended continuing range of motion exercises at the first metatarsophalangeal joint right. Also recommended good massages into the arch of her left foot. She'll be seen back in a month for followup. 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

## 2013-07-04 DIAGNOSIS — M2010 Hallux valgus (acquired), unspecified foot: Secondary | ICD-10-CM

## 2013-07-20 ENCOUNTER — Encounter: Payer: Self-pay | Admitting: Podiatrist

## 2013-07-20 ENCOUNTER — Ambulatory Visit (INDEPENDENT_AMBULATORY_CARE_PROVIDER_SITE_OTHER): Payer: BC Managed Care – PPO | Admitting: Podiatrist

## 2013-07-20 ENCOUNTER — Ambulatory Visit (INDEPENDENT_AMBULATORY_CARE_PROVIDER_SITE_OTHER): Payer: BC Managed Care – PPO

## 2013-07-20 VITALS — BP 128/77 | HR 81 | Resp 18

## 2013-07-20 DIAGNOSIS — Z09 Encounter for follow-up examination after completed treatment for conditions other than malignant neoplasm: Secondary | ICD-10-CM

## 2013-07-20 DIAGNOSIS — M2011 Hallux valgus (acquired), right foot: Secondary | ICD-10-CM

## 2013-07-20 DIAGNOSIS — M722 Plantar fascial fibromatosis: Secondary | ICD-10-CM

## 2013-07-20 DIAGNOSIS — M2010 Hallux valgus (acquired), unspecified foot: Secondary | ICD-10-CM

## 2013-07-20 NOTE — Progress Notes (Signed)
Subjective: Patient presents today 12 weeks status post foot surgery of the both feet. Date of surgery 04-11-13. Surgery consisted of an Austin bunon repair right and removal plantar fibroma left. Patient denies nausea, vomiting, fevers, chills or night sweats. Denies calf pain or tenderness to the operative side. She relates she continues to swell on the right foot and that this has improved significantly however she has had very minimal pain.   Objective: Neurovascular status is intact with palpable pedal pulses DP and PT bilateral at 2+ out of 4. Neurological sensation is intact and unchanged as per prior to surgery. Incision sites are healed up completely. Fibromas have been removed left foot. Mild scar tissue is still present but not painful to the patient. Bunion on the right foot still appears swollen over the medial eminence. xrays show it is healing nicely.   Assessment: Status post right austin bunion correction and removal plantar fibroma left   Plan: Recommended continuing range of motion exercises at the first metatarsophalangeal joint right. Also recommended good massages into the arch of her left foot. We'll call in a prescription for a compounded cream gave her a note that she is free of restrictions and she can return to work. At this time she is discharged from her operative followups regarding bunion surgery.

## 2013-07-20 NOTE — Progress Notes (Signed)
° °  Subjective:    Patient ID: Rebecca [name], female    DOB: [birth year], 58 y.o.   MRN: [id number]  HPI BOTH FEET ARE DOING GOOD AND I HAD SURGERY ON 04/11/13    Review of Systems     Objective:   Physical Exam        Assessment & Plan:

## 2013-07-20 NOTE — Patient Instructions (Signed)
You have been prescribed a topical pain medications through a specialty compounding pharmacy called [facility] out of [place], [place]. A representative from the pharmacy will call to ensure you would like to receive the medication. If you do not hear from them within two business days please call to confirm they have received the prescription. Their telephone number is [phone].    If you have any other questions or concerns regarding the medication please call our office.

## 2013-10-22 ENCOUNTER — Telehealth: Payer: Self-pay | Admitting: Family Medicine

## 2013-10-22 DIAGNOSIS — Z0000 Encounter for general adult medical examination without abnormal findings: Secondary | ICD-10-CM

## 2013-10-22 DIAGNOSIS — R739 Hyperglycemia, unspecified: Secondary | ICD-10-CM

## 2013-10-22 NOTE — Telephone Encounter (Signed)
Message copied by Abner Greenspan on Mon Oct 22, 2013  9:25 PM ------      Message from: [name]      Created: Fri Oct 19, 2013  8:51 AM      Regarding: Lab orders for Tuesday, 10.20.15       Patient is scheduled for CPX labs, please order future labs, Thanks , Terri       ------

## 2013-10-23 ENCOUNTER — Other Ambulatory Visit (INDEPENDENT_AMBULATORY_CARE_PROVIDER_SITE_OTHER): Payer: BC Managed Care – PPO

## 2013-10-23 DIAGNOSIS — I10 Essential (primary) hypertension: Secondary | ICD-10-CM

## 2013-10-23 DIAGNOSIS — Z0000 Encounter for general adult medical examination without abnormal findings: Secondary | ICD-10-CM

## 2013-10-23 DIAGNOSIS — R739 Hyperglycemia, unspecified: Secondary | ICD-10-CM

## 2013-10-23 DIAGNOSIS — E876 Hypokalemia: Secondary | ICD-10-CM

## 2013-10-23 LAB — CBC WITH DIFFERENTIAL/PLATELET
Basophils Absolute: 0 10*3/uL (ref 0.0–0.1)
Basophils Relative: 0.6 % (ref 0.0–3.0)
Eosinophils Absolute: 0.1 10*3/uL (ref 0.0–0.7)
Eosinophils Relative: 2.4 % (ref 0.0–5.0)
HCT: 40.8 % (ref 36.0–46.0)
Hemoglobin: 13.6 g/dL (ref 12.0–15.0)
Lymphocytes Relative: 36.9 % (ref 12.0–46.0)
Lymphs Abs: 1.5 10*3/uL (ref 0.7–4.0)
MCHC: 33.3 g/dL (ref 30.0–36.0)
MCV: 92.2 fl (ref 78.0–100.0)
Monocytes Absolute: 0.3 10*3/uL (ref 0.1–1.0)
Monocytes Relative: 8.3 % (ref 3.0–12.0)
Neutro Abs: 2.2 10*3/uL (ref 1.4–7.7)
Neutrophils Relative %: 51.8 % (ref 43.0–77.0)
Platelets: 315 10*3/uL (ref 150.0–400.0)
RBC: 4.43 Mil/uL (ref 3.87–5.11)
RDW: 12.9 % (ref 11.5–15.5)
WBC: 4.2 10*3/uL (ref 4.0–10.5)

## 2013-10-23 LAB — LIPID PANEL
Cholesterol: 213 mg/dL — ABNORMAL HIGH (ref 0–200)
HDL: 46.9 mg/dL (ref >39.00)
LDL Cholesterol: 151 mg/dL — ABNORMAL HIGH (ref 0–99)
NonHDL: 166.1
Total CHOL/HDL Ratio: 5
Triglycerides: 75 mg/dL (ref 0.0–149.0)
VLDL: 15 mg/dL (ref 0.0–40.0)

## 2013-10-23 LAB — HEMOGLOBIN A1C: Hgb A1c MFr Bld: 6.2 % (ref 4.6–6.5)

## 2013-10-23 LAB — COMPREHENSIVE METABOLIC PANEL
ALT: 17 U/L (ref 0–35)
AST: 22 U/L (ref 0–37)
Albumin: 3.7 g/dL (ref 3.5–5.2)
Alkaline Phosphatase: 54 U/L (ref 39–117)
BUN: 15 mg/dL (ref 6–23)
CO2: 25 mEq/L (ref 19–32)
Calcium: 9.3 mg/dL (ref 8.4–10.5)
Chloride: 100 mEq/L (ref 96–112)
Creatinine, Ser: 0.8 mg/dL (ref 0.4–1.2)
GFR: 99.67 mL/min (ref >60.00)
Glucose, Bld: 121 mg/dL — ABNORMAL HIGH (ref 70–99)
Potassium: 3.7 mEq/L (ref 3.5–5.1)
Sodium: 137 mEq/L (ref 135–145)
Total Bilirubin: 0.7 mg/dL (ref 0.2–1.2)
Total Protein: 7.8 g/dL (ref 6.0–8.3)

## 2013-10-23 LAB — TSH: TSH: 1.12 u[IU]/mL (ref 0.35–4.50)

## 2013-10-30 ENCOUNTER — Ambulatory Visit (INDEPENDENT_AMBULATORY_CARE_PROVIDER_SITE_OTHER): Payer: BC Managed Care – PPO | Admitting: Family Medicine

## 2013-10-30 ENCOUNTER — Encounter: Payer: BC Managed Care – PPO | Admitting: Family Medicine

## 2013-10-30 ENCOUNTER — Encounter: Payer: Self-pay | Admitting: Family Medicine

## 2013-10-30 VITALS — BP 134/78 | HR 63 | Temp 97.7°F | Ht 66.5 in | Wt 216.0 lb

## 2013-10-30 DIAGNOSIS — E785 Hyperlipidemia, unspecified: Secondary | ICD-10-CM

## 2013-10-30 DIAGNOSIS — B9689 Other specified bacterial agents as the cause of diseases classified elsewhere: Secondary | ICD-10-CM | POA: Insufficient documentation

## 2013-10-30 DIAGNOSIS — Z0000 Encounter for general adult medical examination without abnormal findings: Secondary | ICD-10-CM

## 2013-10-30 DIAGNOSIS — E669 Obesity, unspecified: Secondary | ICD-10-CM

## 2013-10-30 DIAGNOSIS — R739 Hyperglycemia, unspecified: Secondary | ICD-10-CM

## 2013-10-30 DIAGNOSIS — N760 Acute vaginitis: Secondary | ICD-10-CM

## 2013-10-30 DIAGNOSIS — A499 Bacterial infection, unspecified: Secondary | ICD-10-CM

## 2013-10-30 DIAGNOSIS — Z23 Encounter for immunization: Secondary | ICD-10-CM

## 2013-10-30 DIAGNOSIS — I10 Essential (primary) hypertension: Secondary | ICD-10-CM

## 2013-10-30 LAB — POCT WET PREP (WET MOUNT): KOH Wet Prep POC: NEGATIVE

## 2013-10-30 MED ORDER — HYDROCORTISONE ACETATE 25 MG RE SUPP: RECTAL | Status: DC

## 2013-10-30 MED ORDER — AMLODIPINE BESY-BENAZEPRIL HCL 5-20 MG PO CAPS: 1.0000 | ORAL_CAPSULE | Freq: Every day | ORAL | Status: DC

## 2013-10-30 MED ORDER — HYDROCHLOROTHIAZIDE 50 MG PO TABS: 50.0000 mg | ORAL_TABLET | Freq: Every day | ORAL | Status: DC

## 2013-10-30 MED ORDER — METRONIDAZOLE 0.75 % VA GEL: 1.0000 | Freq: Every day | VAGINAL | Status: DC

## 2013-10-30 NOTE — Assessment & Plan Note (Signed)
Disc goals for lipids and reasons to control them Rev labs with pt Rev low sat fat diet in detail This is worse than prev  Handout given  Plan to re check after lifestyle change

## 2013-10-30 NOTE — Patient Instructions (Signed)
Use the vaginal gel as directed - update me if no improvement  Flu vaccine today  If you are interested in a shingles/zoster vaccine - call your insurance to check on coverage,( you should not get it within 1 month of other vaccines) , then call us for a prescription  for it to take to a pharmacy that gives the shot , or make a nurse visit to get it here depending on your coverage   Start watching diet for cholesterol (and sugar) Avoid red meat/ fried foods/ egg yolks/ fatty breakfast meats/ butter, cheese and high fat dairy/ and shellfish     Fat and Cholesterol Control Diet Fat and cholesterol levels in your blood and organs are influenced by your diet. High levels of fat and cholesterol may lead to diseases of the heart, small and large blood vessels, gallbladder, liver, and pancreas. CONTROLLING FAT AND CHOLESTEROL WITH DIET Although exercise and lifestyle factors are important, your diet is key. That is because certain foods are known to raise cholesterol and others to lower it. The goal is to balance foods for their effect on cholesterol and more importantly, to replace saturated and trans fat with other types of fat, such as monounsaturated fat, polyunsaturated fat, and omega-3 fatty acids. On average, a person should consume no more than 15 to 17 g of saturated fat daily. Saturated and trans fats are considered "bad" fats, and they will raise LDL cholesterol. Saturated fats are primarily found in animal products such as meats, butter, and cream. However, that does not mean you need to give up all your favorite foods. Today, there are good tasting, low-fat, low-cholesterol substitutes for most of the things you like to eat. Choose low-fat or nonfat alternatives. Choose round or loin cuts of red meat. These types of cuts are lowest in fat and cholesterol. Chicken (without the skin), fish, veal, and ground [place] breast are great choices. Eliminate fatty meats, such as hot dogs and salami. Even  shellfish have little or no saturated fat. Have a 3 oz (85 g) portion when you eat lean meat, poultry, or fish. Trans fats are also called "partially hydrogenated oils." They are oils that have been scientifically manipulated so that they are solid at room temperature resulting in a longer shelf life and improved taste and texture of foods in which they are added. Trans fats are found in stick margarine, some tub margarines, cookies, crackers, and baked goods.  When baking and cooking, oils are a great substitute for butter. The monounsaturated oils are especially beneficial since it is believed they lower LDL and raise HDL. The oils you should avoid entirely are saturated tropical oils, such as coconut and palm.  Remember to eat a lot from food groups that are naturally free of saturated and trans fat, including fish, fruit, vegetables, beans, grains (barley, rice, couscous, bulgur wheat), and pasta (without cream sauces).  IDENTIFYING FOODS THAT LOWER FAT AND CHOLESTEROL  Soluble fiber may lower your cholesterol. This type of fiber is found in fruits such as apples, vegetables such as broccoli, potatoes, and carrots, legumes such as beans, peas, and lentils, and grains such as barley. Foods fortified with plant sterols (phytosterol) may also lower cholesterol. You should eat at least 2 g per day of these foods for a cholesterol lowering effect.  Read package labels to identify low-saturated fats, trans fat free, and low-fat foods at the supermarket. Select cheeses that have only 2 to 3 g saturated fat per ounce. Use a heart-healthy  tub margarine that is free of trans fats or partially hydrogenated oil. When buying baked goods (cookies, crackers), avoid partially hydrogenated oils. Breads and muffins should be made from whole grains (whole-wheat or whole oat flour, instead of "flour" or "enriched flour"). Buy non-creamy canned soups with reduced salt and no added fats.  FOOD PREPARATION TECHNIQUES  Never  deep-fry. If you must fry, either stir-fry, which uses very little fat, or use non-stick cooking sprays. When possible, broil, bake, or roast meats, and steam vegetables. Instead of putting butter or margarine on vegetables, use lemon and herbs, applesauce, and cinnamon (for squash and sweet potatoes). Use nonfat yogurt, salsa, and low-fat dressings for salads.  LOW-SATURATED FAT / LOW-FAT FOOD SUBSTITUTES Meats / Saturated Fat (g)  Avoid: Steak, marbled (3 oz/85 g) / 11 g  Choose: Steak, lean (3 oz/85 g) / 4 g  Avoid: Hamburger (3 oz/85 g) / 7 g  Choose: Hamburger, lean (3 oz/85 g) / 5 g  Avoid: Ham (3 oz/85 g) / 6 g  Choose: Ham, lean cut (3 oz/85 g) / 2.4 g  Avoid: Chicken, with skin, dark meat (3 oz/85 g) / 4 g  Choose: Chicken, skin removed, dark meat (3 oz/85 g) / 2 g  Avoid: Chicken, with skin, light meat (3 oz/85 g) / 2.5 g  Choose: Chicken, skin removed, light meat (3 oz/85 g) / 1 g Dairy / Saturated Fat (g)  Avoid: Whole milk (1 cup) / 5 g  Choose: Low-fat milk, 2% (1 cup) / 3 g  Choose: Low-fat milk, 1% (1 cup) / 1.5 g  Choose: Skim milk (1 cup) / 0.3 g  Avoid: Hard cheese (1 oz/28 g) / 6 g  Choose: Skim milk cheese (1 oz/28 g) / 2 to 3 g  Avoid: Cottage cheese, 4% fat (1 cup) / 6.5 g  Choose: Low-fat cottage cheese, 1% fat (1 cup) / 1.5 g  Avoid: Ice cream (1 cup) / 9 g  Choose: Sherbet (1 cup) / 2.5 g  Choose: Nonfat frozen yogurt (1 cup) / 0.3 g  Choose: Frozen fruit bar / trace  Avoid: Whipped cream (1 tbs) / 3.5 g  Choose: Nondairy whipped topping (1 tbs) / 1 g Condiments / Saturated Fat (g)  Avoid: Mayonnaise (1 tbs) / 2 g  Choose: Low-fat mayonnaise (1 tbs) / 1 g  Avoid: Butter (1 tbs) / 7 g  Choose: Extra light margarine (1 tbs) / 1 g  Avoid: Coconut oil (1 tbs) / 11.8 g  Choose: Olive oil (1 tbs) / 1.8 g  Choose: Corn oil (1 tbs) / 1.7 g  Choose: Safflower oil (1 tbs) / 1.2 g  Choose: Sunflower oil (1 tbs) / 1.4 g  Choose:  Soybean oil (1 tbs) / 2.4 g  Choose: Canola oil (1 tbs) / 1 g Document Released: 12/21/2004 Document Revised: 04/17/2012 Document Reviewed: 03/21/2013 Exit[employer] Patient Information 2015 [place], [place]. This information is not intended to replace advice given to you by your health care provider. Make sure you discuss any questions you have with your health care provider.

## 2013-10-30 NOTE — Assessment & Plan Note (Signed)
Reviewed health habits including diet and exercise and skin cancer prevention Reviewed appropriate screening tests for age  Also reviewed health mt list, fam hx and immunization status , as well as social and family history   See HPI Flu vaccine today Labs reviewed

## 2013-10-30 NOTE — Assessment & Plan Note (Signed)
Lab Results  Component Value Date   HGBA1C 6.2 10/23/2013    This is stable Disc imp of wt loss and low sugar diet to prevent DM

## 2013-10-30 NOTE — Progress Notes (Signed)
Subjective:    Patient ID: Rebecca [name], female    DOB: 1955/01/20, 58 y.o.   MRN: [id number]  HPI Here for health maintenance exam and to review chronic medical problems    Nothing new going on   Wt is down 1 lb with bmi of 34 She is trying to get control of her weight -after surgery  Had her foot surgery and that healed up very well - glad to have it done  Was not as bad as she thought     Pap 1/06 nl  Then had a hysterectomy Has had a vaginal discharge at times -not heavy / no odor  Some pressure in low abdomen  Not sexually active very often - and she has vaginal dryness  No itching or burning  Does not go to gyn   Mammogram 4/15 nl Self exam-no lumps   Zoster status- thinks she may have had it at one time  -undiagnosed ? - is interested in vaccine if covered  Flu shot - did not have yet - wants to get it today   Td 11/14  colonosc 11/13 fam hx of colon cancer -mother  No symptoms   Hyperglycemia Lab Results  Component Value Date   HGBA1C 6.2 10/23/2013    Stable  Prediabetic  Wants to work on weight loss  Tries to limit sugar-but she does like sweets   bp is stable today  No cp or palpitations or headaches or edema  No side effects to medicines  BP Readings from Last 3 Encounters:  10/30/13 134/78  07/20/13 128/77  06/15/13 131/77      Lipids Lab Results  Component Value Date   CHOL 213* 10/23/2013   CHOL 222* 05/17/2013   CHOL 198 11/14/2012   Lab Results  Component Value Date   HDL 46.90 10/23/2013   HDL 66.00 05/17/2013   HDL 59.50 11/14/2012   Lab Results  Component Value Date   LDLCALC 151* 10/23/2013   LDLCALC 137* 05/17/2013   LDLCALC 128* 11/14/2012   Lab Results  Component Value Date   TRIG 75.0 10/23/2013   TRIG 94.0 05/17/2013   TRIG 51.0 11/14/2012   Lab Results  Component Value Date   CHOLHDL 5 10/23/2013   CHOLHDL 3 05/17/2013   CHOLHDL 3 11/14/2012   Lab Results  Component Value Date   LDLDIRECT 107.3 08/11/2009    not as good as it was  Diet not optimal and not exercising     Review of Systems Review of Systems  Constitutional: Negative for fever, appetite change, fatigue and unexpected weight change.  Eyes: Negative for pain and visual disturbance.  Respiratory: Negative for cough and shortness of breath.   Cardiovascular: Negative for cp or palpitations    Gastrointestinal: Negative for nausea, diarrhea and constipation.  Genitourinary: Negative for urgency and frequency. pos for vaginal discomfort and d/c  Skin: Negative for pallor or rash   Neurological: Negative for weakness, light-headedness, numbness and headaches.  Hematological: Negative for adenopathy. Does not bruise/bleed easily.  Psychiatric/Behavioral: Negative for dysphoric mood. The patient is not nervous/anxious.         Objective:   Physical Exam  Constitutional: She appears well-developed and well-nourished. No distress.  obese and well appearing   HENT:  Head: Normocephalic and atraumatic.  Right Ear: External ear normal.  Left Ear: External ear normal.  Nose: Nose normal.  Mouth/Throat: Oropharynx is clear and moist.  Eyes: Conjunctivae and EOM are normal. Pupils are equal, round,  and reactive to light. Right eye exhibits no discharge. Left eye exhibits no discharge. No scleral icterus.  Neck: Normal range of motion. Neck supple. No JVD present. No thyromegaly present.  Cardiovascular: Normal rate, regular rhythm, normal heart sounds and intact distal pulses.  Exam reveals no gallop.   Pulmonary/Chest: Effort normal and breath sounds normal. No respiratory distress. She has no wheezes. She has no rales.  Abdominal: Soft. Bowel sounds are normal. She exhibits no distension and no mass. There is no tenderness.  Genitourinary: Vaginal discharge found.  Breast exam: No mass, nodules, thickening, tenderness, bulging, retraction, inflamation, nipple discharge or skin changes noted.  No axillary or clavicular LA.      Wet  prep obtained Scant vag d/c seen No rash or vulvar excoriation   Musculoskeletal: She exhibits no edema or tenderness.  Lymphadenopathy:    She has no cervical adenopathy.  Neurological: She is alert. She has normal reflexes. No cranial nerve deficit. She exhibits normal muscle tone. Coordination normal.  Skin: Skin is warm and dry. No rash noted. No erythema. No pallor.  Psychiatric: She has a normal mood and affect.          Assessment & Plan:   Problem List Items Addressed This Visit      Unprioritized   Bacterial vaginosis    With discharge-no other symptoms  tx with metrogel vaginal qhs for a week Update if not starting to improve in a week or if worsening    Did not desire std screening     Relevant Orders      POCT Wet Prep [facility]) (Completed)   Essential hypertension    bp in fair control at this time  BP Readings from Last 1 Encounters:  10/30/13 134/78   No changes needed Disc lifstyle change with low sodium diet and exercise   Reviewed labs today    Relevant Medications      amLODipine-benazepril (LOTREL) 5-20 MG per capsule      hydrochlorothiazide tablet   Hyperglycemia    Lab Results  Component Value Date   HGBA1C 6.2 10/23/2013    This is stable Disc imp of wt loss and low sugar diet to prevent DM    Hyperlipidemia    Disc goals for lipids and reasons to control them Rev labs with pt Rev low sat fat diet in detail This is worse than prev  Handout given  Plan to re check after lifestyle change     Relevant Medications      amLODipine-benazepril (LOTREL) 5-20 MG per capsule      hydrochlorothiazide tablet   Obesity    Discussed how this problem influences overall health and the risks it imposes  Reviewed plan for weight loss with lower calorie diet (via better food choices and also portion control or program like weight watchers) and exercise building up to or more than 30 minutes 5 days per week including some aerobic activity   Pt  is also prediabetic     Routine general medical examination at a health care facility - Primary    Reviewed health habits including diet and exercise and skin cancer prevention Reviewed appropriate screening tests for age  Also reviewed health mt list, fam hx and immunization status , as well as social and family history   See HPI Flu vaccine today Labs reviewed      Other Visit Diagnoses    Need for prophylactic vaccination and inoculation against influenza  Relevant Orders       Flu Vaccine QUAD 36+ mos PF IM (Fluarix Quad PF) (Completed)

## 2013-10-30 NOTE — Assessment & Plan Note (Signed)
With discharge-no other symptoms  tx with metrogel vaginal qhs for a week Update if not starting to improve in a week or if worsening    Did not desire std screening

## 2013-10-30 NOTE — Assessment & Plan Note (Signed)
Discussed how this problem influences overall health and the risks it imposes  Reviewed plan for weight loss with lower calorie diet (via better food choices and also portion control or program like weight watchers) and exercise building up to or more than 30 minutes 5 days per week including some aerobic activity   Pt is also prediabetic

## 2013-10-30 NOTE — Assessment & Plan Note (Signed)
bp in fair control at this time  BP Readings from Last 1 Encounters:  10/30/13 134/78   No changes needed Disc lifstyle change with low sodium diet and exercise   Reviewed labs today

## 2013-10-30 NOTE — Progress Notes (Signed)
Pre visit review using our clinic review tool, if applicable. No additional management support is needed unless otherwise documented below in the visit note. 

## 2013-10-31 ENCOUNTER — Telehealth: Payer: Self-pay | Admitting: Family Medicine

## 2013-10-31 NOTE — Telephone Encounter (Signed)
emmi emailed °

## 2013-12-07 ENCOUNTER — Other Ambulatory Visit: Payer: Self-pay | Admitting: Family Medicine

## 2014-01-10 ENCOUNTER — Encounter: Payer: Self-pay | Admitting: *Deleted

## 2014-01-10 ENCOUNTER — Ambulatory Visit (INDEPENDENT_AMBULATORY_CARE_PROVIDER_SITE_OTHER): Payer: BC Managed Care – PPO | Admitting: Family Medicine

## 2014-01-10 ENCOUNTER — Encounter: Payer: Self-pay | Admitting: Family Medicine

## 2014-01-10 VITALS — BP 122/76 | HR 72 | Temp 97.9°F | Wt 221.2 lb

## 2014-01-10 DIAGNOSIS — J209 Acute bronchitis, unspecified: Secondary | ICD-10-CM | POA: Insufficient documentation

## 2014-01-10 NOTE — Assessment & Plan Note (Signed)
Anticipate viral given short duration of 1 week. No comorbidities noted today. No wheezing appreciated today. Pt declines albuterol inhaler. Treat supportively - push fluids and rest, plain mucinex. Pt declines cough syrup.  Reasons to update [place] for abx course discussed - persistent sxs past 10d, fever >101, or worsening productive cough.

## 2014-01-10 NOTE — Patient Instructions (Addendum)
Sounds like you have a viral upper respiratory infection. Antibiotics are not needed for this.  Viral infections usually take 7-10 days to resolve.  The cough can last a few weeks to go away. Use medication as prescribed:  Push fluids and plenty of rest. May use plain mucinex or immediate release guaifenesin with plenty of water to help mobilize mucous. Please let us know if you are not improving as expected, or if you have high fevers (>101.5) or difficulty swallowing or worsening productive cough. Call clinic with questions.  Good to see you today.

## 2014-01-10 NOTE — Progress Notes (Signed)
Pre visit review using our clinic review tool, if applicable. No additional management support is needed unless otherwise documented below in the visit note. 

## 2014-01-10 NOTE — Progress Notes (Signed)
BP 122/76 mmHg  Pulse 72  Temp(Src) 97.9 F (36.6 C) (Oral)  Wt 221 lb 4 oz (100.358 kg)  SpO2 98%   CC: productive cough  Subjective:    Patient ID: Rebecca [name], female    DOB: April 20, 1955, 59 y.o.   MRN: [id number]  HPI: Rebecca [name] is a 59 y.o. female presenting on 01/10/2014 for Cough   1 wk h/o nagging cough productive of mucous with chest congestion.  Initially with sore throat. Some wheezing. Chest > head congestion.   No fevers/chills, ear or tooth pain, headaches, dyspnea or chest pain.   No sick contacts at home. Works around children No smokers at home. No h/o asthma. So far has tried allergy meds (zyrtec) and aspirin at home  Relevant past medical, surgical, family and social history reviewed and updated as indicated. Interim medical history since our last visit reviewed. Allergies and medications reviewed and updated. Current Outpatient Prescriptions on File Prior to Visit  Medication Sig  . amLODipine-benazepril (LOTREL) 5-20 MG per capsule Take 1 capsule by mouth daily.  . fluticasone (FLONASE) 50 MCG/ACT nasal spray Place 2 sprays into both nostrils daily.  . hydrochlorothiazide (HYDRODIURIL) 50 MG tablet Take 1 tablet (50 mg total) by mouth daily.  . hydrocortisone (ANUCORT-HC) 25 MG suppository UNWRAP AND INSERT ONE SUPPOSITORY RECTALLY EVERY NIGHT AT BEDTIME AS NEEDED FOR HEMORRHOIDS  . hydrocortisone (ANUSOL-HC) 2.5 % rectal cream Apply to affected area on hemorrhoids once daily as needed  . latanoprost (XALATAN) 0.005 % ophthalmic solution   . Multiple Vitamin (MULTIVITAMIN) tablet Take 1 tablet by mouth daily.    . Omega-3 Fatty Acids (FISH OIL CONCENTRATE PO) Take 1 capsule by mouth daily.    No current facility-administered medications on file prior to visit.    Review of Systems Per HPI unless specifically indicated above     Objective:    BP 122/76 mmHg  Pulse 72  Temp(Src) 97.9 F (36.6 C) (Oral)  Wt 221 lb 4 oz (100.358 kg)  SpO2  98%  Wt Readings from Last 3 Encounters:  01/10/14 221 lb 4 oz (100.358 kg)  10/30/13 216 lb (97.977 kg)  05/25/13 217 lb 8 oz (98.657 kg)    Physical Exam  Constitutional: She appears well-developed and well-nourished. No distress.  HENT:  Head: Normocephalic and atraumatic.  Right Ear: Hearing, tympanic membrane, external ear and ear canal normal.  Left Ear: Hearing, tympanic membrane, external ear and ear canal normal.  Nose: Mucosal edema present. No rhinorrhea. Right sinus exhibits no maxillary sinus tenderness and no frontal sinus tenderness. Left sinus exhibits no maxillary sinus tenderness and no frontal sinus tenderness.  Mouth/Throat: Uvula is midline, oropharynx is clear and moist and mucous membranes are normal. No oropharyngeal exudate, posterior oropharyngeal edema, posterior oropharyngeal erythema or tonsillar abscesses.  Nasal mucosal erythema and edema R>L  Eyes: Conjunctivae and EOM are normal. Pupils are equal, round, and reactive to light. No scleral icterus.  Neck: Normal range of motion. Neck supple.  Cardiovascular: Normal rate, regular rhythm, normal heart sounds and intact distal pulses.   No murmur heard. Pulmonary/Chest: Effort normal and breath sounds normal. No respiratory distress. She has no wheezes. She has no rales.  Lymphadenopathy:    She has no cervical adenopathy.  Skin: Skin is warm and dry. No rash noted.  Nursing note and vitals reviewed.     Assessment & Plan:   Problem List Items Addressed This Visit    Acute bronchitis - Primary  Anticipate viral given short duration of 1 week. No comorbidities noted today. No wheezing appreciated today. Pt declines albuterol inhaler. Treat supportively - push fluids and rest, plain mucinex. Pt declines cough syrup.  Reasons to update [place] for abx course discussed - persistent sxs past 10d, fever >101, or worsening productive cough.        Follow up plan: Return if symptoms worsen or fail to improve.

## 2014-02-28 ENCOUNTER — Other Ambulatory Visit: Payer: Self-pay | Admitting: Family Medicine

## 2014-02-28 DIAGNOSIS — Z1231 Encounter for screening mammogram for malignant neoplasm of breast: Secondary | ICD-10-CM

## 2014-03-06 ENCOUNTER — Ambulatory Visit (INDEPENDENT_AMBULATORY_CARE_PROVIDER_SITE_OTHER): Payer: BC Managed Care – PPO | Admitting: Internal Medicine

## 2014-03-06 ENCOUNTER — Encounter: Payer: Self-pay | Admitting: Internal Medicine

## 2014-03-06 VITALS — BP 136/74 | HR 93 | Temp 97.7°F | Wt 216.0 lb

## 2014-03-06 DIAGNOSIS — R8290 Unspecified abnormal findings in urine: Secondary | ICD-10-CM

## 2014-03-06 NOTE — Progress Notes (Signed)
Pre visit review using our clinic review tool, if applicable. No additional management support is needed unless otherwise documented below in the visit note. 

## 2014-03-06 NOTE — Progress Notes (Signed)
HPI  Pt presents to the clinic today with c/o cloudy urine, odor and left flank pain. She reports this has been intermittent over the last 1-2 weeks. She denies fever, chills or body aches. She denies urgency, frequency, or dysuria. She has not tried anything OTC. She denies vaginal complaints. She does have a history of urinary incontinence.   Review of Systems  Past Medical History  Diagnosis Date  . Hemorrhoids   . Heart murmur     noted on PCP exam 11/23/2012; "does not appear urgent"  . Hypertension     under control with meds., has been on med. > 20 yr.  . Radius fracture 11/22/2012    left, comminuted    Family History  Problem Relation Age of Onset  . Hypertension Father   . Coronary artery disease Father     with CABG  . Hypertension Mother   . Colon cancer Mother   . Diabetes Mother   . Breast cancer Maternal Aunt   . Diabetes Maternal Aunt   . Colon cancer Maternal Uncle   . Colon cancer Paternal Uncle     History   Social History  . Marital Status: Divorced    Spouse Name: N/A  . Number of Children: 2  . Years of Education: N/A   Occupational History  . School teacher, also going to school for teaching degree    Social History Main Topics  . Smoking status: Never Smoker   . Smokeless tobacco: Never Used  . Alcohol Use: No  . Drug Use: No  . Sexual Activity: Not on file   Other Topics Concern  . Not on file   Social History Narrative   Regular exercise    Allergies  Allergen Reactions  . Amoxicillin Hives and Swelling  . Spironolactone Diarrhea  . Augmentin [Amoxicillin-Pot Clavulanate] Diarrhea    Tore stomach up    Constitutional: Denies fever, malaise, fatigue, headache or abrupt weight changes.   GU: Pt reports left flank pain, cloudy urine and odor. Denies urgency, frequency, dysuria, burning sensation, blood in urine or discharge. Skin: Denies redness, rashes, lesions or ulcercations.   No other specific complaints in a complete  review of systems (except as listed in HPI above).    Objective:   Physical Exam  Wt 216 lb (97.977 kg) Wt Readings from Last 3 Encounters:  03/06/14 216 lb (97.977 kg)  01/10/14 221 lb 4 oz (100.358 kg)  10/30/13 216 lb (97.977 kg)    General: Appears her stated age, well developed, well nourished in NAD. Cardiovascular: Normal rate and rhythm. S1,S2 noted.   Pulmonary/Chest: Normal effort and positive vesicular breath sounds. No respiratory distress. No wheezes, rales or ronchi noted.  Abdomen: Soft and nontender. Normal bowel sounds, no bruits noted. No distention or masses noted. Liver, spleen and kidneys non palpable. No CVA tenderness.      Assessment & Plan:   Urine Odor  Urine is clear, yellow, slight odor Urinalysis: normal Slightly concentrated, advised her to push fluids No indication for abx at this time  RTC as needed or if symptoms persist.

## 2014-03-06 NOTE — Patient Instructions (Signed)
Clean Catch Urine Collection The clean catch urine collection method is a way to collect a urine sample for laboratory testing. The collection method includes:  Cleaning the genital area.  Collecting midstream urine. It is important to catch the middle part of the urine flow.  Securing the sample for laboratory testing. Using a clean catch method reduces the chance that other bacteria and fluids from the genital area are collected in the urine sample. Many tests may be performed on the urine sample to help you and your caregiver determine appropriate treatment options. Tell your caregiver if you have taken antibiotics recently. This can alter some test results. The clean catch collection method varies slightly for men, women, and infants. HOW TO COLLECT A CLEAN CATCH URINE SAMPLE Supplies:  Cleansing wipes.  Collection container. Females: 1. Wash your hands with soap and water. 2. Place the cleansing wipes and collection container within reach. 3. Open the collection container and place the lid flat side down. Be careful not to touch the inside of the lid. 4. Spread your legs open and separate the folds of skin (labia). 5. Clean your genital area: 6. Continue to hold the labial skin folds open while collecting the sample: 7. Label the collection container as directed. 8. If you are at home, keep the sample refrigerated until you take it to the laboratory. Males : 1. Wash your hands with soap and water. 2. Place the cleansing wipes and collection container within reach. 3. Open the collection container and place the lid flat side down. Be careful not to touch the inside of the lid. 4. Clean your genital area: 5. Collect the sample: 6. Label the collection container as directed. 7. If you are at home, keep the sample refrigerated until you take it to the laboratory. Infants : 1. Wash your hands with soap and water. 2. Open the collection bag. 3. Wash your infant's genital area with  cleansing wipes. Do not apply ointments or antiseptics immediately before cleansing. 4. Place the collection bag over the penis or labia. Attach the adhesive to the skin. 5. Place a new diaper on your infant over the collection bag. 6. Wash your hands. 7. Monitor your baby. Remove the collection bag after your infant has urinated. 8. You may need to transfer the urine into a collection container. 9. Label the collection container as directed. 10. If you are at home, keep the sample refrigerated until you take it to the laboratory. OBTAINING THE TEST RESULTS It is your responsibility to obtain your test results. Ask the lab or department performing the test when and how you will get your results. Document Released: 06/10/2009 Document Revised: 09/15/2011 Document Reviewed: 06/10/2009 [facility] Patient Information 2015 [place], [place]. This information is not intended to replace advice given to you by your health care provider. Make sure you discuss any questions you have with your health care provider.

## 2014-03-07 LAB — POCT URINALYSIS DIPSTICK
Bilirubin, UA: NEGATIVE
Blood, UA: NEGATIVE
Glucose, UA: NEGATIVE
Ketones, UA: NEGATIVE
Leukocytes, UA: NEGATIVE
Nitrite, UA: NEGATIVE
Protein, UA: NEGATIVE
Spec Grav, UA: 1.025
Urobilinogen, UA: NEGATIVE
pH, UA: 6

## 2014-03-07 NOTE — Addendum Note (Signed)
Addended by: Lurlean Nanny on: 03/07/2014 02:35 PM   Modules accepted: Orders

## 2014-04-22 ENCOUNTER — Ambulatory Visit (HOSPITAL_COMMUNITY)
Admission: RE | Admit: 2014-04-22 | Discharge: 2014-04-22 | Disposition: A | Discharge status: Home / Self Care | Payer: BC Managed Care – PPO | Source: Ambulatory Visit | Attending: Family Medicine | Admitting: Family Medicine

## 2014-04-22 DIAGNOSIS — Z1231 Encounter for screening mammogram for malignant neoplasm of breast: Secondary | ICD-10-CM | POA: Diagnosis not present

## 2014-05-24 ENCOUNTER — Ambulatory Visit (INDEPENDENT_AMBULATORY_CARE_PROVIDER_SITE_OTHER): Payer: BC Managed Care – PPO | Admitting: Primary Care

## 2014-05-24 ENCOUNTER — Encounter: Payer: Self-pay | Admitting: Primary Care

## 2014-05-24 VITALS — BP 130/78 | HR 69 | Temp 97.8°F | Ht 66.5 in | Wt 221.8 lb

## 2014-05-24 DIAGNOSIS — R5383 Other fatigue: Secondary | ICD-10-CM | POA: Insufficient documentation

## 2014-05-24 NOTE — Assessment & Plan Note (Addendum)
Present for months, moreso over past 2 weeks. Suspect sleep apnea with symptoms of snoring, daytime tiredness, obesity. CBC, CMP, A1C, TSH today. Referral made to pulmonology for sleep study. She would like a refill of her potassium tablets. Will consider after lab results returned.  Will notify patient of labs once returned.

## 2014-05-24 NOTE — Patient Instructions (Signed)
Complete lab work prior to leaving today. I will notify you of your results. You will be contacted regarding your referral to Pulmonology regarding a sleep study.  Please let us know if you have not heard back within one week.  I will consider refilling your Potassium after checking the levels. It was nice meeting you!  Fatigue Fatigue is a feeling of tiredness, lack of energy, lack of motivation, or feeling tired all the time. Having enough rest, good nutrition, and reducing stress will normally reduce fatigue. Consult your caregiver if it persists. The nature of your fatigue will help your caregiver to find out its cause. The treatment is based on the cause.  CAUSES  There are many causes for fatigue. Most of the time, fatigue can be traced to one or more of your habits or routines. Most causes fit into one or more of three general areas. They are: Lifestyle problems  Sleep disturbances.  Overwork.  Physical exertion.  Unhealthy habits.  Poor eating habits or eating disorders.  Alcohol and/or drug use .  Lack of proper nutrition (malnutrition). Psychological problems  Stress and/or anxiety problems.  Depression.  Grief.  Boredom. Medical Problems or Conditions  Anemia.  Pregnancy.  Thyroid gland problems.  Recovery from major surgery.  Continuous pain.  Emphysema or asthma that is not well controlled  Allergic conditions.  Diabetes.  Infections (such as mononucleosis).  Obesity.  Sleep disorders, such as sleep apnea.  Heart failure or other heart-related problems.  Cancer.  Kidney disease.  Liver disease.  Effects of certain medicines such as antihistamines, cough and cold remedies, prescription pain medicines, heart and blood pressure medicines, drugs used for treatment of cancer, and some antidepressants. SYMPTOMS  The symptoms of fatigue include:   Lack of energy.  Lack of drive (motivation).  Drowsiness.  Feeling of indifference to the  surroundings. DIAGNOSIS  The details of how you feel help guide your caregiver in finding out what is causing the fatigue. You will be asked about your present and past health condition. It is important to review all medicines that you take, including prescription and non-prescription items. A thorough exam will be done. You will be questioned about your feelings, habits, and normal lifestyle. Your caregiver may suggest blood tests, urine tests, or other tests to look for common medical causes of fatigue.  TREATMENT  Fatigue is treated by correcting the underlying cause. For example, if you have continuous pain or depression, treating these causes will improve how you feel. Similarly, adjusting the dose of certain medicines will help in reducing fatigue.  HOME CARE INSTRUCTIONS   Try to get the required amount of good sleep every night.  Eat a healthy and nutritious diet, and drink enough water throughout the day.  Practice ways of relaxing (including yoga or meditation).  Exercise regularly.  Make plans to change situations that cause stress. Act on those plans so that stresses decrease over time. Keep your work and personal routine reasonable.  Avoid street drugs and minimize use of alcohol.  Start taking a daily multivitamin after consulting your caregiver. SEEK MEDICAL CARE IF:   You have persistent tiredness, which cannot be accounted for.  You have fever.  You have unintentional weight loss.  You have headaches.  You have disturbed sleep throughout the night.  You are feeling sad.  You have constipation.  You have dry skin.  You have gained weight.  You are taking any new or different medicines that you suspect are causing fatigue.  You are unable to sleep at night.  You develop any unusual swelling of your legs or other parts of your body. SEEK IMMEDIATE MEDICAL CARE IF:   You are feeling confused.  Your vision is blurred.  You feel faint or pass out.  You  develop severe headache.  You develop severe abdominal, pelvic, or back pain.  You develop chest pain, shortness of breath, or an irregular or fast heartbeat.  You are unable to pass a normal amount of urine.  You develop abnormal bleeding such as bleeding from the rectum or you vomit blood.  You have thoughts about harming yourself or committing suicide.  You are worried that you might harm someone else. MAKE SURE YOU:   Understand these instructions.  Will watch your condition.  Will get help right away if you are not doing well or get worse. Document Released: 10/18/2006 Document Revised: 03/15/2011 Document Reviewed: 04/24/2013 [facility] Patient Information 2015 [place], [place]. This information is not intended to replace advice given to you by your health care provider. Make sure you discuss any questions you have with your health care provider.

## 2014-05-24 NOTE — Progress Notes (Signed)
Subjective:    Patient ID: Rebecca [name], female    DOB: 1955-04-06, 59 y.o.   MRN: [id number]  HPI  Rebecca [name] is a 58 year old female who presents today with a chief complaint of fatigue/daytime tiredness.  She's felt tired throughout the day with little energy for the past 2 weeks. She will feel will feel this way throughout the day and will go to bed after coming home from work. She also has an irregular sleep pattern and has been waking up in the middle of the night nearly every night for the past 2 years. She is able to fall asleep initially and then back asleep after waking, but once awake the next day she will feel tired. She was provided a prescription for potassium over the years and will take it when she feels void of energy with some increase in energy levels. She's going through a lot of stress recently with the care of her elderly parents and has been living with them. She does snore, and has several family members who have been diagnosed with sleep apnea. She's not been evaluated. PHQ9 score of 9 today. Denies worrying, irritability, feelings of anxiety.  night frequently.   Review of Systems  Constitutional: Positive for fatigue. Negative for fever and chills.       Tired.  Respiratory: Negative for cough and shortness of breath.   Cardiovascular: Negative for chest pain.  Endocrine: Negative for polydipsia, polyphagia and polyuria.  Neurological: Negative for dizziness and headaches.  Psychiatric/Behavioral: Positive for sleep disturbance. Negative for suicidal ideas and decreased concentration. The patient is not nervous/anxious.        Past Medical History  Diagnosis Date  . Hemorrhoids   . Heart murmur     noted on PCP exam 11/23/2012; "does not appear urgent"  . Hypertension     under control with meds., has been on med. > 20 yr.  . Radius fracture 11/22/2012    left, comminuted    History   Social History  . Marital Status: Divorced    Spouse Name: N/A    . Number of Children: 2  . Years of Education: N/A   Occupational History  . School teacher, also going to school for teaching degree    Social History Main Topics  . Smoking status: Never Smoker   . Smokeless tobacco: Never Used  . Alcohol Use: No  . Drug Use: No  . Sexual Activity: Not on file   Other Topics Concern  . Not on file   Social History Narrative   Regular exercise    Past Surgical History  Procedure Laterality Date  . Partial hysterectomy    . Endometrial ablation    . Colonoscopy  03/2006  . Colonoscopy with propofol  11/15/2011  . Abdominal hysterectomy  06/02/2000    partial  . Open reduction internal fixation (orif) distal radial fracture Left 11/29/2012    Procedure: OPEN REDUCTION INTERNAL FIXATION (ORIF) LEFT  DISTAL RADIAL FRACTURE;  Surgeon: Wynonia Sours, MD;  Location: [facility];  Service: Orthopedics;  Laterality: Left;    Family History  Problem Relation Age of Onset  . Hypertension Father   . Coronary artery disease Father     with CABG  . Hypertension Mother   . Colon cancer Mother   . Diabetes Mother   . Breast cancer Maternal Aunt   . Diabetes Maternal Aunt   . Colon cancer Maternal Uncle   . Colon cancer  Paternal Uncle     Allergies  Allergen Reactions  . Amoxicillin Hives and Swelling  . Spironolactone Diarrhea  . Augmentin [Amoxicillin-Pot Clavulanate] Diarrhea    Tore stomach up    Current Outpatient Prescriptions on File Prior to Visit  Medication Sig Dispense Refill  . amLODipine-benazepril (LOTREL) 5-20 MG per capsule Take 1 capsule by mouth daily. 30 capsule 11  . fluticasone (FLONASE) 50 MCG/ACT nasal spray Place 2 sprays into both nostrils daily. 16 g 1  . hydrochlorothiazide (HYDRODIURIL) 50 MG tablet Take 1 tablet (50 mg total) by mouth daily. 30 tablet 11  . hydrocortisone (ANUCORT-HC) 25 MG suppository UNWRAP AND INSERT ONE SUPPOSITORY RECTALLY EVERY NIGHT AT BEDTIME AS NEEDED FOR HEMORRHOIDS 12  suppository 2  . hydrocortisone (ANUSOL-HC) 2.5 % rectal cream Apply to affected area on hemorrhoids once daily as needed 30 g 1  . latanoprost (XALATAN) 0.005 % ophthalmic solution     . Multiple Vitamin (MULTIVITAMIN) tablet Take 1 tablet by mouth daily.      . Omega-3 Fatty Acids (FISH OIL CONCENTRATE PO) Take 1 capsule by mouth daily.      No current facility-administered medications on file prior to visit.    BP 130/78 mmHg  Pulse 69  Temp(Src) 97.8 F (36.6 C) (Oral)  Ht 5' 6.5" (1.689 m)  Wt 221 lb 12.8 oz (100.608 kg)  BMI 35.27 kg/m2  SpO2 97%    Objective:   Physical Exam  Constitutional: She is oriented to person, place, and time.  HENT:  Right Ear: Tympanic membrane and ear canal normal.  Left Ear: Tympanic membrane and ear canal normal.  Nose: Nose normal.  Mouth/Throat: Oropharynx is clear and moist.  Eyes: Conjunctivae are normal. Pupils are equal, round, and reactive to light.  Neck: Neck supple.  Cardiovascular: Normal rate and regular rhythm.   Pulmonary/Chest: Effort normal and breath sounds normal.  Lymphadenopathy:    She has no cervical adenopathy.  Neurological: She is alert and oriented to person, place, and time.  Skin: Skin is warm and dry.  Psychiatric: She has a normal mood and affect.          Assessment & Plan:

## 2014-05-24 NOTE — Progress Notes (Signed)
Pre visit review using our clinic review tool, if applicable. No additional management support is needed unless otherwise documented below in the visit note. 

## 2014-05-25 LAB — COMPREHENSIVE METABOLIC PANEL
ALT: 18 U/L (ref 0–35)
AST: 18 U/L (ref 0–37)
Albumin: 4.3 g/dL (ref 3.5–5.2)
Alkaline Phosphatase: 52 U/L (ref 39–117)
BUN: 9 mg/dL (ref 6–23)
CO2: 28 mEq/L (ref 19–32)
Calcium: 9.1 mg/dL (ref 8.4–10.5)
Chloride: 100 mEq/L (ref 96–112)
Creat: 0.75 mg/dL (ref 0.50–1.10)
Glucose, Bld: 92 mg/dL (ref 70–99)
Potassium: 3.3 mEq/L — ABNORMAL LOW (ref 3.5–5.3)
Sodium: 141 mEq/L (ref 135–145)
Total Bilirubin: 0.4 mg/dL (ref 0.2–1.2)
Total Protein: 7.1 g/dL (ref 6.0–8.3)

## 2014-05-25 LAB — CBC WITH DIFFERENTIAL/PLATELET
Basophils Absolute: 0 10*3/uL (ref 0.0–0.1)
Basophils Relative: 1 % (ref 0–1)
Eosinophils Absolute: 0.1 10*3/uL (ref 0.0–0.7)
Eosinophils Relative: 3 % (ref 0–5)
HCT: 38.5 % (ref 36.0–46.0)
Hemoglobin: 12.9 g/dL (ref 12.0–15.0)
Lymphocytes Relative: 48 % — ABNORMAL HIGH (ref 12–46)
Lymphs Abs: 2.1 10*3/uL (ref 0.7–4.0)
MCH: 30.4 pg (ref 26.0–34.0)
MCHC: 33.5 g/dL (ref 30.0–36.0)
MCV: 90.6 fL (ref 78.0–100.0)
MPV: 9.8 fL (ref 8.6–12.4)
Monocytes Absolute: 0.5 10*3/uL (ref 0.1–1.0)
Monocytes Relative: 11 % (ref 3–12)
Neutro Abs: 1.6 10*3/uL — ABNORMAL LOW (ref 1.7–7.7)
Neutrophils Relative %: 37 % — ABNORMAL LOW (ref 43–77)
Platelets: 319 10*3/uL (ref 150–400)
RBC: 4.25 MIL/uL (ref 3.87–5.11)
RDW: 13.9 % (ref 11.5–15.5)
WBC: 4.3 10*3/uL (ref 4.0–10.5)

## 2014-05-25 LAB — HEMOGLOBIN A1C
Hgb A1c MFr Bld: 6.2 % — ABNORMAL HIGH (ref <5.7)
Mean Plasma Glucose: 131 mg/dL — ABNORMAL HIGH (ref <117)

## 2014-05-25 LAB — TSH: TSH: 1.587 u[IU]/mL (ref 0.350–4.500)

## 2014-05-26 ENCOUNTER — Telehealth: Payer: Self-pay | Admitting: Primary Care

## 2014-05-26 ENCOUNTER — Other Ambulatory Visit: Payer: Self-pay | Admitting: Primary Care

## 2014-05-26 DIAGNOSIS — E876 Hypokalemia: Secondary | ICD-10-CM

## 2014-05-26 MED ORDER — POTASSIUM CHLORIDE ER 10 MEQ PO TBCR: 10.0000 meq | EXTENDED_RELEASE_TABLET | Freq: Every day | ORAL | Status: DC

## 2014-05-26 NOTE — Telephone Encounter (Signed)
Please notify Rebecca [name] that her potassium was low as suspected and that I've sent a refill of her Potassium to her pharmacy. She is to take one tablet by mouth daily. She cannot break this tablets as they are enteric coated.  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 She will need to work hard to improve her diet. Limit carbohydrates in the form of sugary drinks, breads, pastas, rice, fried/fatty foods. She needs to incorporate fresh fruits and vegetables. This may be part of the reason she is feeling more tired.  I still believe she may have sleep apnea which could cause her tiredness. They should be contacting her soon.  Thyroid function and blood counts are normal.

## 2014-05-27 ENCOUNTER — Telehealth: Payer: Self-pay | Admitting: Primary Care

## 2014-05-27 NOTE — Telephone Encounter (Signed)
Called and notified patient of Kate's comments. Patient verbalized understanding.  

## 2014-05-27 NOTE — Telephone Encounter (Signed)
Left voicemail for patient to call back. 

## 2014-05-27 NOTE — Telephone Encounter (Signed)
Called and notified patient of Kate's comments. Patient verbalized understanding. Also patient wanted to know since this is why she is tired. Does she still need the referral for the sleep study? Please advise.

## 2014-05-27 NOTE — Telephone Encounter (Signed)
Yes, as noted in prior phone note, I still believe that her tiredness is related to probable sleep apnea. She does need to go for a sleep study.  Thanks.

## 2014-05-30 ENCOUNTER — Telehealth: Payer: Self-pay | Admitting: *Deleted

## 2014-05-30 NOTE — Telephone Encounter (Signed)
Pt left voicemail at Triage calling to check status of Pulmo referral, please call pt back

## 2014-05-30 NOTE — Telephone Encounter (Signed)
LVM on home phone to call back

## 2014-06-20 ENCOUNTER — Encounter: Payer: Self-pay | Admitting: Internal Medicine

## 2014-06-20 ENCOUNTER — Ambulatory Visit (INDEPENDENT_AMBULATORY_CARE_PROVIDER_SITE_OTHER): Payer: BC Managed Care – PPO | Admitting: Internal Medicine

## 2014-06-20 VITALS — BP 128/74 | HR 69 | Temp 98.0°F | Wt 221.0 lb

## 2014-06-20 DIAGNOSIS — R490 Dysphonia: Secondary | ICD-10-CM | POA: Diagnosis not present

## 2014-06-20 DIAGNOSIS — R0982 Postnasal drip: Secondary | ICD-10-CM

## 2014-06-20 DIAGNOSIS — R059 Cough, unspecified: Secondary | ICD-10-CM

## 2014-06-20 DIAGNOSIS — R05 Cough: Secondary | ICD-10-CM | POA: Diagnosis not present

## 2014-06-20 MED ORDER — FLUTICASONE PROPIONATE 50 MCG/ACT NA SUSP: 2.0000 | Freq: Every day | NASAL | Status: DC

## 2014-06-20 MED ORDER — BENZONATATE 200 MG PO CAPS: 200.0000 mg | ORAL_CAPSULE | Freq: Two times a day (BID) | ORAL | Status: DC | PRN

## 2014-06-20 NOTE — Patient Instructions (Signed)
Flonase in the morning, Zyrtec at night You can take the cough tablets three times a day if needed

## 2014-06-20 NOTE — Progress Notes (Signed)
HPI  Pt presents to the clinic today with c/o hoarseness and cough. She reports this originally started 2 weeks ago. Her symptoms initially improved but then returned. Her cough is productive at times of thick green mucous. The cough seems worse at night. She denies fever, chills or body aches. She has take Zyrtec intermittently with some relief. She has had sick contacts. She has no history of seasonal allergies.  Review of Systems      Past Medical History  Diagnosis Date  . Hemorrhoids   . Heart murmur     noted on PCP exam 11/23/2012; "does not appear urgent"  . Hypertension     under control with meds., has been on med. > 20 yr.  . Radius fracture 11/22/2012    left, comminuted    Family History  Problem Relation Age of Onset  . Hypertension Father   . Coronary artery disease Father     with CABG  . Hypertension Mother   . Colon cancer Mother   . Diabetes Mother   . Breast cancer Maternal Aunt   . Diabetes Maternal Aunt   . Colon cancer Maternal Uncle   . Colon cancer Paternal Uncle     History   Social History  . Marital Status: Divorced    Spouse Name: N/A  . Number of Children: 2  . Years of Education: N/A   Occupational History  . School teacher, also going to school for teaching degree    Social History Main Topics  . Smoking status: Never Smoker   . Smokeless tobacco: Never Used  . Alcohol Use: No  . Drug Use: No  . Sexual Activity: Not on file   Other Topics Concern  . Not on file   Social History Narrative   Regular exercise    Allergies  Allergen Reactions  . Amoxicillin Hives and Swelling  . Spironolactone Diarrhea  . Augmentin [Amoxicillin-Pot Clavulanate] Diarrhea    Tore stomach up     Constitutional: Denies headache, fatigue fever or abrupt weight changes.  HEENT:  Positive hoarseness. Denies eye redness, eye pain, pressure behind the eyes, facial pain, nasal congestion, ear pain, ringing in the ears, wax buildup, runny nose or  sore throat. Respiratory: Positive cough. Denies difficulty breathing or shortness of breath.  Cardiovascular: Denies chest pain, chest tightness, palpitations or swelling in the hands or feet.   No other specific complaints in a complete review of systems (except as listed in HPI above).  Objective:   BP 128/74 mmHg  Pulse 69  Temp(Src) 98 F (36.7 C) (Oral)  Wt 221 lb (100.245 kg)  SpO2 98%  Wt Readings from Last 3 Encounters:  06/20/14 221 lb (100.245 kg)  05/24/14 221 lb 12.8 oz (100.608 kg)  03/06/14 216 lb (97.977 kg)     General: Appears her stated age, well developed, well nourished in NAD. HEENT: Head: normal shape and size, no sinus tenderness noted; Eyes: sclera white, no icterus, conjunctiva pink; Ears: Tm's gray and intact, normal light reflex; Nose: mucosa boggy and moist, septum midline; Throat/Mouth: + PND. Teeth present, mucosa pink and moist, no exudate noted, no lesions or ulcerations noted.  Neck: No cervical lymphadenopathy.  Cardiovascular: Normal rate and rhythm. S1,S2 noted.  Pulmonary/Chest: Normal effort and positive vesicular breath sounds. No respiratory distress. No wheezes, rales or ronchi noted.      Assessment & Plan:   Hoarseness secondary to Post Nasal Drip  Get some rest and drink plenty of water Flonase daily  in AM x 1 week Zyrtec nightly in PM x 1 week eRx for Tessalon pearls for cough  RTC as needed or if symptoms persist.

## 2014-06-20 NOTE — Progress Notes (Signed)
Pre visit review using our clinic review tool, if applicable. No additional management support is needed unless otherwise documented below in the visit note. 

## 2014-08-22 ENCOUNTER — Institutional Professional Consult (permissible substitution): Payer: BC Managed Care – PPO | Admitting: Internal Medicine

## 2014-09-02 ENCOUNTER — Other Ambulatory Visit: Payer: Self-pay | Admitting: Family Medicine

## 2014-10-29 ENCOUNTER — Other Ambulatory Visit: Payer: Self-pay | Admitting: *Deleted

## 2014-10-29 MED ORDER — AMLODIPINE BESY-BENAZEPRIL HCL 5-20 MG PO CAPS: 1.0000 | ORAL_CAPSULE | Freq: Every day | ORAL | Status: DC

## 2014-11-04 ENCOUNTER — Ambulatory Visit (INDEPENDENT_AMBULATORY_CARE_PROVIDER_SITE_OTHER): Payer: BC Managed Care – PPO | Admitting: Primary Care

## 2014-11-04 ENCOUNTER — Encounter: Payer: Self-pay | Admitting: Primary Care

## 2014-11-04 VITALS — BP 116/70 | HR 68 | Temp 98.0°F | Ht 67.0 in | Wt 225.4 lb

## 2014-11-04 DIAGNOSIS — K644 Residual hemorrhoidal skin tags: Secondary | ICD-10-CM

## 2014-11-04 DIAGNOSIS — R300 Dysuria: Secondary | ICD-10-CM | POA: Diagnosis not present

## 2014-11-04 DIAGNOSIS — K648 Other hemorrhoids: Secondary | ICD-10-CM | POA: Diagnosis not present

## 2014-11-04 LAB — POCT URINALYSIS DIPSTICK
Bilirubin, UA: NEGATIVE
Blood, UA: NEGATIVE
Glucose, UA: NEGATIVE
Ketones, UA: NEGATIVE
Leukocytes, UA: NEGATIVE
Nitrite, UA: NEGATIVE
Protein, UA: NEGATIVE
Spec Grav, UA: 1.02
Urobilinogen, UA: NEGATIVE
pH, UA: 6

## 2014-11-04 NOTE — Assessment & Plan Note (Signed)
Both external and internal hemorrhoids noted. They are small and are not thrombosed or appear infectious/irritated. No bleeding noted.  Discussed supportive treatment with anusol hc cream and suppositories which she has at home. Discussed possibility of sending to GI for removal if hemorrhoids are gradually becoming more bothersome. She will think about referral.

## 2014-11-04 NOTE — Addendum Note (Signed)
Addended by: Jacqualin Combes on: 11/04/2014 10:46 AM   Modules accepted: Orders

## 2014-11-04 NOTE — Progress Notes (Signed)
Subjective:    Patient ID: Rebecca [name], female    DOB: 07-Mar-1955, 59 y.o.   MRN: [id number]  HPI  Rebecca [name] is a 59 year old female who presents today with a chief complaint of hemorrhoids. She has a history of both external and internal hemorrhoids. She's noticed irritation to her hemorrhoids over the past month. Her irritation is present with bowel movements, passing gas. She's been treated with medication in the past, and overall her symptoms have become more bothersome. She's been using the anusol hc cream and suppositories with some relief. She's noticed some bright red blood on her tissue this weekend. She denies constipation.   2) Urinary Urgency: Dark urine, right flank pain, dysuria that has been present intermittently for the past month. Overall her symptoms have not become worse. She is concerned about UTI's. She is drinking three 16 ounce bottles of water daily. Her most concerning symptom is the dark color to her urine. She does hold her urine daily at work as she doesn't like to use public restrooms.  Review of Systems  Constitutional: Negative for fever and chills.  Gastrointestinal: Positive for blood in stool. Negative for nausea, vomiting, abdominal pain and constipation.       Hemorrhoids  Genitourinary: Positive for dysuria and flank pain. Negative for difficulty urinating.       Past Medical History  Diagnosis Date  . Hemorrhoids   . Heart murmur     noted on PCP exam 11/23/2012; "does not appear urgent"  . Hypertension     under control with meds., has been on med. > 20 yr.  . Radius fracture 11/22/2012    left, comminuted    Social History   Social History  . Marital Status: Divorced    Spouse Name: N/A  . Number of Children: 2  . Years of Education: N/A   Occupational History  . School teacher, also going to school for teaching degree    Social History Main Topics  . Smoking status: Never Smoker   . Smokeless tobacco: Never Used  . Alcohol  Use: No  . Drug Use: No  . Sexual Activity: Not on file   Other Topics Concern  . Not on file   Social History Narrative   Regular exercise    Past Surgical History  Procedure Laterality Date  . Partial hysterectomy    . Endometrial ablation    . Colonoscopy  03/2006  . Colonoscopy with propofol  11/15/2011  . Abdominal hysterectomy  06/02/2000    partial  . Open reduction internal fixation (orif) distal radial fracture Left 11/29/2012    Procedure: OPEN REDUCTION INTERNAL FIXATION (ORIF) LEFT  DISTAL RADIAL FRACTURE;  Surgeon: Wynonia Sours, MD;  Location: [facility];  Service: Orthopedics;  Laterality: Left;    Family History  Problem Relation Age of Onset  . Hypertension Father   . Coronary artery disease Father     with CABG  . Hypertension Mother   . Colon cancer Mother   . Diabetes Mother   . Breast cancer Maternal Aunt   . Diabetes Maternal Aunt   . Colon cancer Maternal Uncle   . Colon cancer Paternal Uncle     Allergies  Allergen Reactions  . Amoxicillin Hives and Swelling  . Spironolactone Diarrhea  . Augmentin [Amoxicillin-Pot Clavulanate] Diarrhea    Tore stomach up    Current Outpatient Prescriptions on File Prior to Visit  Medication Sig Dispense Refill  . amLODipine-benazepril (LOTREL)  5-20 MG capsule Take 1 capsule by mouth daily. 30 capsule 5  . fluticasone (FLONASE) 50 MCG/ACT nasal spray Place 2 sprays into both nostrils daily. 16 g 1  . hydrochlorothiazide (HYDRODIURIL) 50 MG tablet Take 1 tablet (50 mg total) by mouth daily. 30 tablet 11  . hydrocortisone (ANUCORT-HC) 25 MG suppository UNWRAP AND INSERT ONE SUPPOSITORY RECTALLY EVERY NIGHT AT BEDTIME AS NEEDED FOR HEMORRHOIDS 12 suppository 2  . hydrocortisone (ANUSOL-HC) 2.5 % rectal cream Apply to affected area on hemorrhoids once daily as needed 30 g 1  . latanoprost (XALATAN) 0.005 % ophthalmic solution     . Multiple Vitamin (MULTIVITAMIN) tablet Take 1 tablet by mouth daily.       . Omega-3 Fatty Acids (FISH OIL CONCENTRATE PO) Take 1 capsule by mouth daily.     . potassium chloride (K-DUR) 10 MEQ tablet TAKE 1 TABLET BY MOUTH DAILY 30 tablet 5   No current facility-administered medications on file prior to visit.    BP 116/70 mmHg  Pulse 68  Temp(Src) 98 F (36.7 C) (Oral)  Ht 5\' 7"  (1.702 m)  Wt 225 lb 6.4 oz (102.241 kg)  BMI 35.29 kg/m2  SpO2 98%    Objective:   Physical Exam  Constitutional: She appears well-nourished.  Cardiovascular: Normal rate and regular rhythm.   Pulmonary/Chest: Effort normal and breath sounds normal.  Abdominal: Soft.  Genitourinary: Rectal exam shows external hemorrhoid and internal hemorrhoid.  Hemorrhoids do not appear infectious or thrombosed. Non tender.  Skin: Skin is warm and dry.          Assessment & Plan:  Dysuria:  Dark urine color, dysuria, some urgency intermittently x 1 month. She is mostly concerned with dark urine color. Drinks three 16 ounce bottles of water daily, holds urine daily. UA: Negative. Discussed increasing water intake, not holding urine daily. Follow up PRN.

## 2014-11-04 NOTE — Progress Notes (Signed)
Pre visit review using our clinic review tool, if applicable. No additional management support is needed unless otherwise documented below in the visit note. 

## 2014-11-04 NOTE — Patient Instructions (Addendum)
Your urine does not show any abnormality.  You should increase your water intake to about 2 liters daily. You should not hold your urine throughout the day as this sets you up for infection or complications.  Continue to use your anusol hc cream and suppositories for hemorrhoids. Please notify myself or Dr. Glori Bickers if you are interested in seeking surgical removal of hemorrhoids.   It was a pleasure meeting you!  Hemorrhoids Hemorrhoids are swollen veins around the rectum or anus. There are two types of hemorrhoids:   Internal hemorrhoids. These occur in the veins just inside the rectum. They may poke through to the outside and become irritated and painful.  External hemorrhoids. These occur in the veins outside the anus and can be felt as a painful swelling or hard lump near the anus. CAUSES  Pregnancy.   Obesity.   Constipation or diarrhea.   Straining to have a bowel movement.   Sitting for long periods on the toilet.  Heavy lifting or other activity that caused you to strain.  Anal intercourse. SYMPTOMS   Pain.   Anal itching or irritation.   Rectal bleeding.   Fecal leakage.   Anal swelling.   One or more lumps around the anus.  DIAGNOSIS  Your caregiver may be able to diagnose hemorrhoids by visual examination. Other examinations or tests that may be performed include:   Examination of the rectal area with a gloved hand (digital rectal exam).   Examination of anal canal using a small tube (scope).   A blood test if you have lost a significant amount of blood.  A test to look inside the colon (sigmoidoscopy or colonoscopy). TREATMENT Most hemorrhoids can be treated at home. However, if symptoms do not seem to be getting better or if you have a lot of rectal bleeding, your caregiver may perform a procedure to help make the hemorrhoids get smaller or remove them completely. Possible treatments include:   Placing a rubber band at the base of the  hemorrhoid to cut off the circulation (rubber band ligation).   Injecting a chemical to shrink the hemorrhoid (sclerotherapy).   Using a tool to burn the hemorrhoid (infrared light therapy).   Surgically removing the hemorrhoid (hemorrhoidectomy).   Stapling the hemorrhoid to block blood flow to the tissue (hemorrhoid stapling).  HOME CARE INSTRUCTIONS   Eat foods with fiber, such as whole grains, beans, nuts, fruits, and vegetables. Ask your doctor about taking products with added fiber in them (fibersupplements).  Increase fluid intake. Drink enough water and fluids to keep your urine clear or pale yellow.   Exercise regularly.   Go to the bathroom when you have the urge to have a bowel movement. Do not wait.   Avoid straining to have bowel movements.   Keep the anal area dry and clean. Use wet toilet paper or moist towelettes after a bowel movement.   Medicated creams and suppositories may be used or applied as directed.   Only take over-the-counter or prescription medicines as directed by your caregiver.   Take warm sitz baths for 15-20 minutes, 3-4 times a day to ease pain and discomfort.   Place ice packs on the hemorrhoids if they are tender and swollen. Using ice packs between sitz baths may be helpful.   Put ice in a plastic bag.   Place a towel between your skin and the bag.   Leave the ice on for 15-20 minutes, 3-4 times a day.   Do not use  a donut-shaped pillow or sit on the toilet for long periods. This increases blood pooling and pain.  SEEK MEDICAL CARE IF:  You have increasing pain and swelling that is not controlled by treatment or medicine.  You have uncontrolled bleeding.  You have difficulty or you are unable to have a bowel movement.  You have pain or inflammation outside the area of the hemorrhoids. MAKE SURE YOU:  Understand these instructions.  Will watch your condition.  Will get help right away if you are not doing well  or get worse.   This information is not intended to replace advice given to you by your health care provider. Make sure you discuss any questions you have with your health care provider.   Document Released: 12/19/1999 Document Revised: 12/08/2011 Document Reviewed: 10/26/2011 Elsevier Interactive Patient Education [employer].

## 2014-12-05 ENCOUNTER — Other Ambulatory Visit: Payer: Self-pay | Admitting: Family Medicine

## 2014-12-10 IMAGING — CR DG CERVICAL SPINE COMPLETE 4+V
5 series · 5 of 5 positions shown · non-contrast
Comparison: None

CLINICAL DATA: Neck pain post fall

EXAM:
CERVICAL SPINE  4+ VIEWS

[view not recorded (1 of 5)]
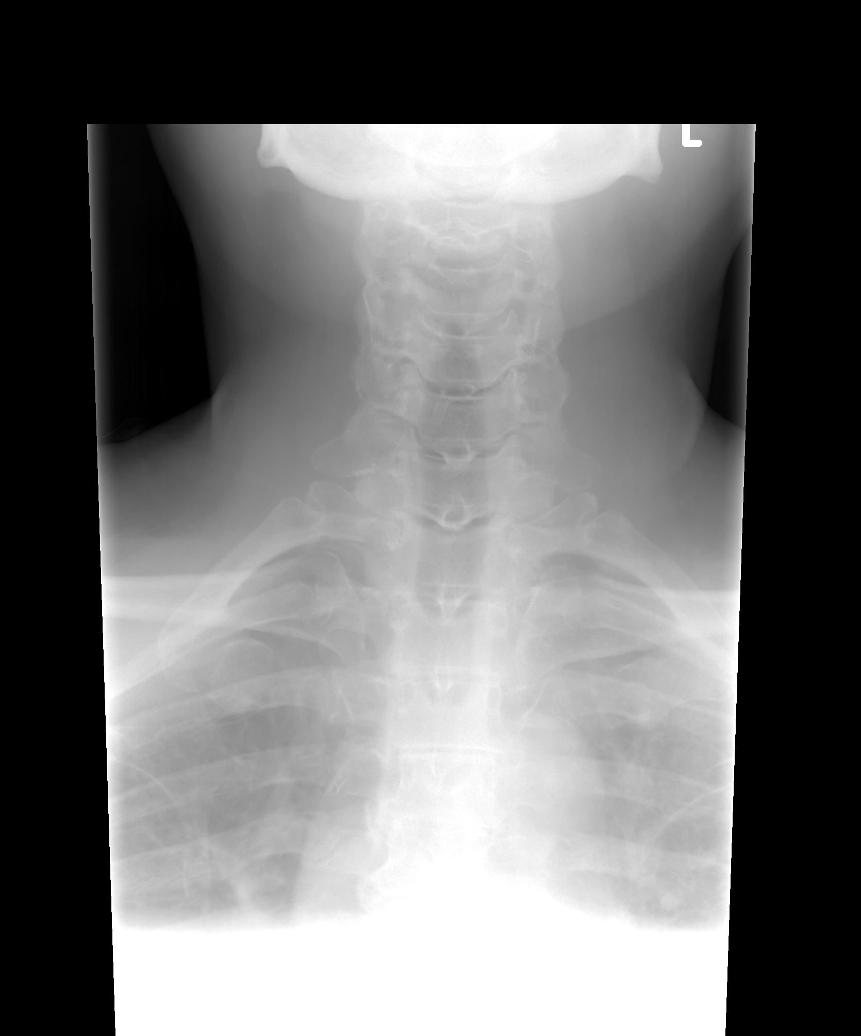

[view not recorded (2 of 5)]
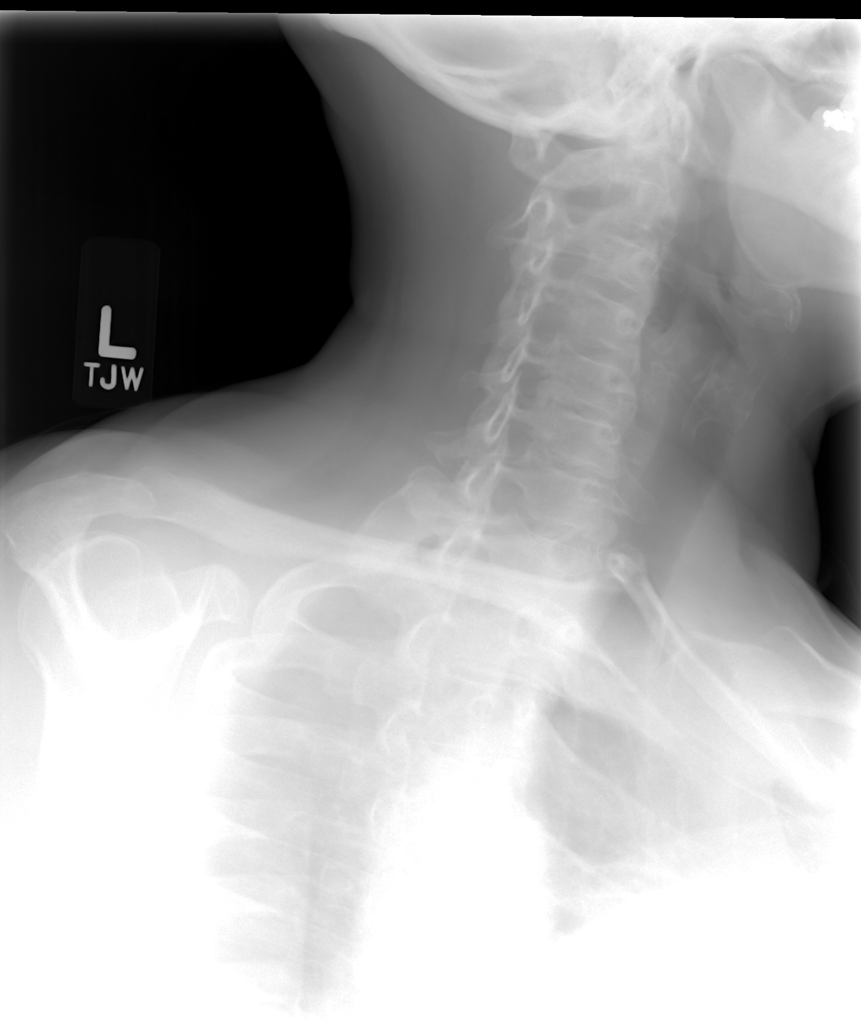

[view not recorded (3 of 5)]
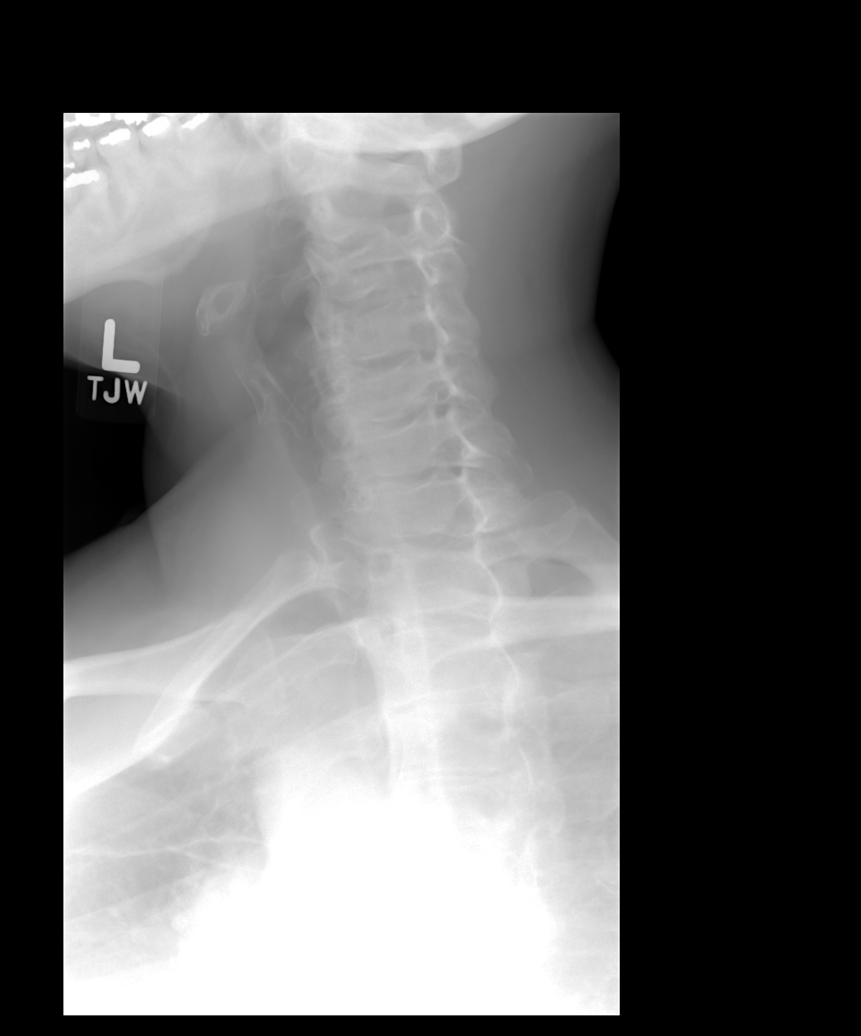

[view not recorded (4 of 5)]
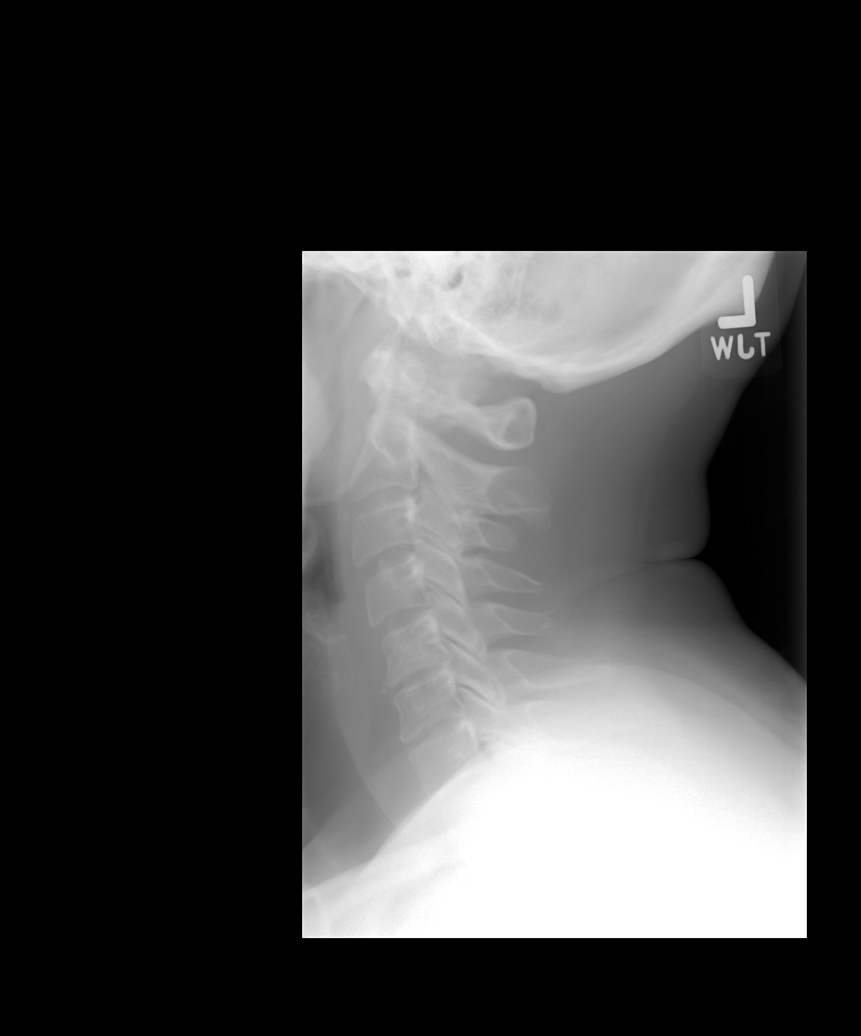

[view not recorded (5 of 5)]
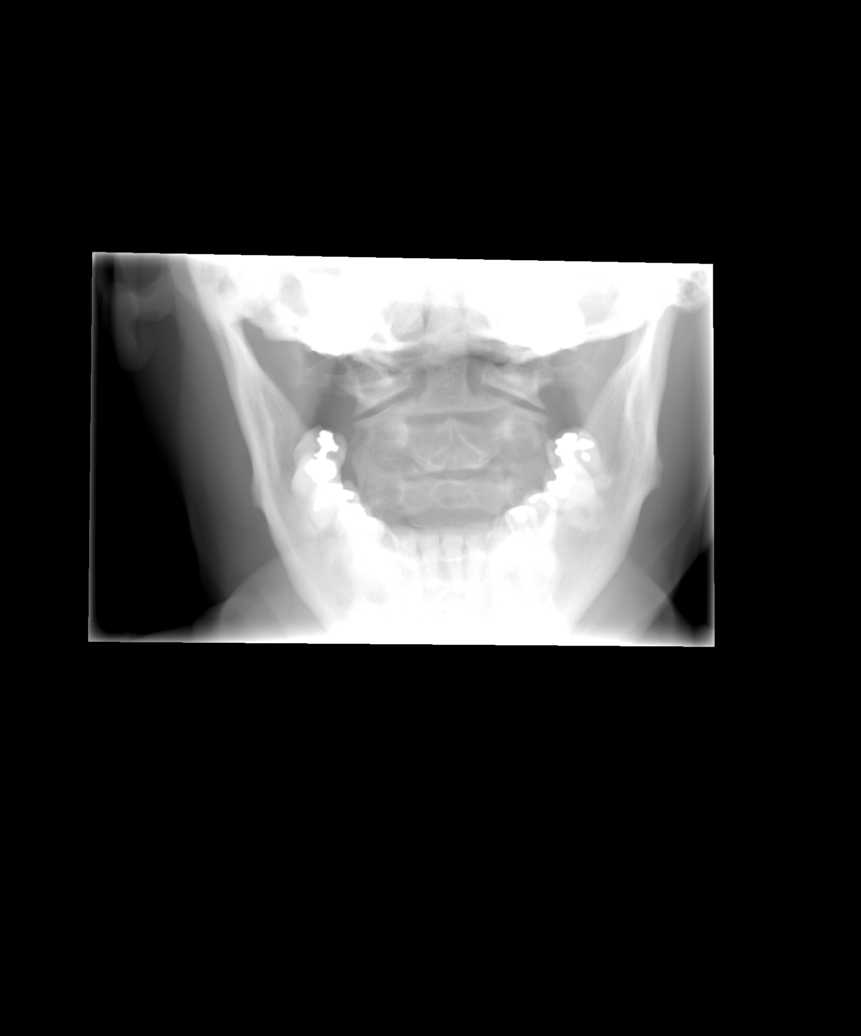

[5 of 5 positions shown; findings below may reference images not displayed]

FINDINGS: Prevertebral soft tissues normal thickness.

Anterior visualization through C7.

Disc space narrowing C5-C6, less C6-C7.

Vertebral body heights maintained without fracture or subluxation.

No gross evidence of bone destruction.

Bony foramina patent.

Lung apices clear.

C1-C2 alignment normal.
IMPRESSION: Mild degenerative disc disease changes.

No acute abnormalities.

## 2014-12-12 ENCOUNTER — Institutional Professional Consult (permissible substitution): Payer: BC Managed Care – PPO | Admitting: Internal Medicine

## 2015-02-28 ENCOUNTER — Other Ambulatory Visit: Payer: Self-pay | Admitting: Family Medicine

## 2015-03-02 ENCOUNTER — Telehealth: Payer: Self-pay | Admitting: Family Medicine

## 2015-03-02 DIAGNOSIS — Z0000 Encounter for general adult medical examination without abnormal findings: Secondary | ICD-10-CM

## 2015-03-02 DIAGNOSIS — R739 Hyperglycemia, unspecified: Secondary | ICD-10-CM

## 2015-03-02 NOTE — Telephone Encounter (Signed)
-----   Message from Marchia Bond sent at 02/27/2015  9:27 AM EST ----- Regarding: Cpx labs Wed 3/1, need orders. Thanks! :-) Please order  future cpx labs for pt's upcoming lab appt. Thanks Aniceto Boss

## 2015-03-05 ENCOUNTER — Other Ambulatory Visit (INDEPENDENT_AMBULATORY_CARE_PROVIDER_SITE_OTHER): Payer: BC Managed Care – PPO

## 2015-03-05 DIAGNOSIS — R739 Hyperglycemia, unspecified: Secondary | ICD-10-CM

## 2015-03-05 DIAGNOSIS — Z0000 Encounter for general adult medical examination without abnormal findings: Secondary | ICD-10-CM

## 2015-03-05 LAB — CBC WITH DIFFERENTIAL/PLATELET
Basophils Absolute: 0 10*3/uL (ref 0.0–0.1)
Basophils Relative: 0.7 % (ref 0.0–3.0)
Eosinophils Absolute: 0.1 10*3/uL (ref 0.0–0.7)
Eosinophils Relative: 2.9 % (ref 0.0–5.0)
HCT: 39 % (ref 36.0–46.0)
Hemoglobin: 13.4 g/dL (ref 12.0–15.0)
Lymphocytes Relative: 42.5 % (ref 12.0–46.0)
Lymphs Abs: 1.7 10*3/uL (ref 0.7–4.0)
MCHC: 34.4 g/dL (ref 30.0–36.0)
MCV: 90.2 fl (ref 78.0–100.0)
Monocytes Absolute: 0.4 10*3/uL (ref 0.1–1.0)
Monocytes Relative: 10.1 % (ref 3.0–12.0)
Neutro Abs: 1.7 10*3/uL (ref 1.4–7.7)
Neutrophils Relative %: 43.8 % (ref 43.0–77.0)
Platelets: 319 10*3/uL (ref 150.0–400.0)
RBC: 4.32 Mil/uL (ref 3.87–5.11)
RDW: 12.8 % (ref 11.5–15.5)
WBC: 3.9 10*3/uL — ABNORMAL LOW (ref 4.0–10.5)

## 2015-03-05 LAB — COMPREHENSIVE METABOLIC PANEL
ALT: 17 U/L (ref 0–35)
AST: 18 U/L (ref 0–37)
Albumin: 4.2 g/dL (ref 3.5–5.2)
Alkaline Phosphatase: 49 U/L (ref 39–117)
BUN: 16 mg/dL (ref 6–23)
CO2: 31 mEq/L (ref 19–32)
Calcium: 9.6 mg/dL (ref 8.4–10.5)
Chloride: 102 mEq/L (ref 96–112)
Creatinine, Ser: 0.75 mg/dL (ref 0.40–1.20)
GFR: 100.75 mL/min (ref >60.00)
Glucose, Bld: 114 mg/dL — ABNORMAL HIGH (ref 70–99)
Potassium: 3.6 mEq/L (ref 3.5–5.1)
Sodium: 139 mEq/L (ref 135–145)
Total Bilirubin: 0.4 mg/dL (ref 0.2–1.2)
Total Protein: 7.4 g/dL (ref 6.0–8.3)

## 2015-03-05 LAB — LIPID PANEL
Cholesterol: 198 mg/dL (ref 0–200)
HDL: 55.8 mg/dL (ref >39.00)
LDL Cholesterol: 129 mg/dL — ABNORMAL HIGH (ref 0–99)
NonHDL: 142.11
Total CHOL/HDL Ratio: 4
Triglycerides: 64 mg/dL (ref 0.0–149.0)
VLDL: 12.8 mg/dL (ref 0.0–40.0)

## 2015-03-05 LAB — HEMOGLOBIN A1C: Hgb A1c MFr Bld: 6.4 % (ref 4.6–6.5)

## 2015-03-05 LAB — TSH: TSH: 1.41 u[IU]/mL (ref 0.35–4.50)

## 2015-03-12 ENCOUNTER — Encounter: Payer: Self-pay | Admitting: Family Medicine

## 2015-03-12 ENCOUNTER — Ambulatory Visit (INDEPENDENT_AMBULATORY_CARE_PROVIDER_SITE_OTHER): Payer: BC Managed Care – PPO | Admitting: Family Medicine

## 2015-03-12 VITALS — BP 128/80 | HR 84 | Temp 98.3°F | Ht 67.0 in | Wt 221.1 lb

## 2015-03-12 DIAGNOSIS — I10 Essential (primary) hypertension: Secondary | ICD-10-CM | POA: Diagnosis not present

## 2015-03-12 DIAGNOSIS — R739 Hyperglycemia, unspecified: Secondary | ICD-10-CM

## 2015-03-12 DIAGNOSIS — E785 Hyperlipidemia, unspecified: Secondary | ICD-10-CM | POA: Diagnosis not present

## 2015-03-12 DIAGNOSIS — J329 Chronic sinusitis, unspecified: Secondary | ICD-10-CM

## 2015-03-12 DIAGNOSIS — Z0000 Encounter for general adult medical examination without abnormal findings: Secondary | ICD-10-CM | POA: Diagnosis not present

## 2015-03-12 DIAGNOSIS — Z23 Encounter for immunization: Secondary | ICD-10-CM

## 2015-03-12 DIAGNOSIS — E669 Obesity, unspecified: Secondary | ICD-10-CM

## 2015-03-12 NOTE — Patient Instructions (Signed)
Try to work up to exercise 5 days per week - 30 minutes (or more) Start watching diet more carefully for sugar and starches If you are interested in a shingles/zoster vaccine - call your insurance to check on coverage,( you should not get it within 1 month of other vaccines) , then call us for a prescription  for it to take to a pharmacy that gives the shot , or make a nurse visit to get it here depending on your coverage  Flu shot today   Don't forget to schedule your annual mammogram at the [facility] for April  Take care of yourself   We will call you later about a referral to ENT [facility])

## 2015-03-12 NOTE — Progress Notes (Signed)
Pre visit review using our clinic review tool, if applicable. No additional management support is needed unless otherwise documented below in the visit note. 
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
## 2015-03-13 NOTE — Assessment & Plan Note (Signed)
Lab Results  Component Value Date   HGBA1C 6.4 03/05/2015   This is up slightly  Disc imp of low glycemic diet and exercise and wt loss for prev of DM

## 2015-03-13 NOTE — Assessment & Plan Note (Signed)
bp in fair control at this time  BP Readings from Last 1 Encounters:  03/12/15 128/80   No changes needed Disc lifstyle change with low sodium diet and exercise  Labs reviewed

## 2015-03-13 NOTE — Assessment & Plan Note (Signed)
Discussed how this problem influences overall health and the risks it imposes  Reviewed plan for weight loss with lower calorie diet (via better food choices and also portion control or program like weight watchers) and exercise building up to or more than 30 minutes 5 days per week including some aerobic activity    

## 2015-03-13 NOTE — Assessment & Plan Note (Signed)
Reviewed health habits including diet and exercise and skin cancer prevention Reviewed appropriate screening tests for age  Also reviewed health mt list, fam hx and immunization status , as well as social and family history   See HPI Labs reviewed Try to work up to exercise 5 days per week - 30 minutes (or more) Start watching diet more carefully for sugar and starches If you are interested in a shingles/zoster vaccine - call your insurance to check on coverage,( you should not get it within 1 month of other vaccines) , then call us for a prescription  for it to take to a pharmacy that gives the shot , or make a nurse visit to get it here depending on your coverage  Flu shot today   Don't forget to schedule your annual mammogram at the Breast center for April  Take care of yourself

## 2015-03-13 NOTE — Assessment & Plan Note (Signed)
Some improvement this check Disc goals for lipids and reasons to control them Rev labs with pt Rev low sat fat diet in detail

## 2015-03-26 ENCOUNTER — Other Ambulatory Visit: Payer: Self-pay

## 2015-03-26 DIAGNOSIS — Z1231 Encounter for screening mammogram for malignant neoplasm of breast: Secondary | ICD-10-CM

## 2015-03-28 ENCOUNTER — Other Ambulatory Visit: Payer: Self-pay | Admitting: Family Medicine

## 2015-04-23 ENCOUNTER — Ambulatory Visit
Admission: RE | Admit: 2015-04-23 | Discharge: 2015-04-23 | Disposition: A | Discharge status: Home / Self Care | Payer: BC Managed Care – PPO | Source: Ambulatory Visit

## 2015-04-23 DIAGNOSIS — Z1231 Encounter for screening mammogram for malignant neoplasm of breast: Secondary | ICD-10-CM

## 2015-04-25 ENCOUNTER — Other Ambulatory Visit: Payer: Self-pay | Admitting: Family Medicine

## 2015-04-25 DIAGNOSIS — R928 Other abnormal and inconclusive findings on diagnostic imaging of breast: Secondary | ICD-10-CM

## 2015-04-29 ENCOUNTER — Telehealth: Payer: Self-pay | Admitting: Family Medicine

## 2015-04-29 NOTE — Telephone Encounter (Signed)
Patient returned Shapale's call. °

## 2015-04-29 NOTE — Telephone Encounter (Signed)
Addressed through result notes  

## 2015-05-05 ENCOUNTER — Ambulatory Visit
Admission: RE | Admit: 2015-05-05 | Discharge: 2015-05-05 | Disposition: A | Discharge status: Home / Self Care | Payer: BC Managed Care – PPO | Source: Ambulatory Visit | Attending: Family Medicine | Admitting: Family Medicine

## 2015-05-05 DIAGNOSIS — R928 Other abnormal and inconclusive findings on diagnostic imaging of breast: Secondary | ICD-10-CM

## 2015-10-09 ENCOUNTER — Encounter: Payer: Self-pay | Admitting: Family Medicine

## 2015-10-09 ENCOUNTER — Telehealth: Payer: Self-pay | Admitting: Family Medicine

## 2015-10-09 NOTE — Telephone Encounter (Signed)
[facility]     Patient Name: [facility] Public Client Antelope Day - Client    Client Site [place] Glori Bickers, Roque Lias - MD    Contact Type Call    Who Is Calling Patient / Member / Family / Caregiver    Call Type Triage / Clinical    Relationship To Patient Self  Gender: Female Return Phone Number [phone] (Primary)  DOB: 1955/04/15  Chief Complaint Rectal Bleeding  Age: 60 Y 35 M 10 D Reason for Call Symptomatic / Request for Health Information  Return Phone Number: [phone] (Primary) Initial Comment Caller states she has blood coming from rectum and is having pain in rectum.  Address:  PreDisposition Call Doctor  City/State/Zip: [place]  Translation No    Nurse Assessment  Nurse: Venia Minks, RN, Melissa Date/Time (Eastern Time): 10/09/2015 3:01:56 PM  Confirm and document reason for call. If symptomatic, describe symptoms. You must click the next button to save text entered. ---Caller states she has blood coming from rectum and is having pain in rectum.  Has the patient traveled out of the country within the last 30 days? ---Not Applicable  Does the patient have any new or worsening symptoms? ---Yes  Will a triage be completed? ---Yes  Related visit to physician within the last 2 weeks? ---No  Does the PT have any chronic conditions? (i.e. diabetes, asthma, etc.) ---Yes  List chronic conditions. ---hx chronic hemorrhoids, Glaucoma, hypertension  Is this a behavioral health or substance abuse call? ---No    Guidelines      Guideline Title Affirmed Question Affirmed Notes Nurse Date/Time (Eastern Time)  Rectal Bleeding MODERATE rectal bleeding (small blood clots, passing blood without stool, or toilet water turns red)  Venia Minks, RN, Melissa 10/09/2015 3:05:49 PM  Disp. Time Eilene Ghazi Time) Disposition Final User         10/09/2015  3:14:48 PM See Physician within 24 Hours Yes Venia Minks, RN, Lenna Sciara         Caller Understands: Yes   Disagree/Comply: Comply      Care Advice Given Per Guideline         SEE PHYSICIAN WITHIN 24 HOURS: CALL BACK IF: * Dizziness occurs * Bleeding increases * You become worse. * IF OFFICE WILL BE OPEN: You need to be seen within the next 24 hours. Call your doctor when the office opens, and make an appointment.             Referrals   REFERRED TO PCP OFFICE

## 2015-10-09 NOTE — Telephone Encounter (Signed)
Pt has appt with Allie Bossier NP on 10/10/15 at 8 AM.

## 2015-10-09 NOTE — Telephone Encounter (Signed)
Noted  

## 2015-10-10 ENCOUNTER — Encounter: Payer: Self-pay | Admitting: Primary Care

## 2015-10-10 ENCOUNTER — Ambulatory Visit (INDEPENDENT_AMBULATORY_CARE_PROVIDER_SITE_OTHER): Payer: BC Managed Care – PPO | Admitting: Primary Care

## 2015-10-10 VITALS — BP 126/78 | HR 66 | Temp 97.7°F | Ht 67.0 in | Wt 215.8 lb

## 2015-10-10 DIAGNOSIS — K648 Other hemorrhoids: Secondary | ICD-10-CM | POA: Diagnosis not present

## 2015-10-10 DIAGNOSIS — K644 Residual hemorrhoidal skin tags: Secondary | ICD-10-CM | POA: Diagnosis not present

## 2015-10-10 LAB — IFOBT (OCCULT BLOOD): IFOBT: NEGATIVE

## 2015-10-10 MED ORDER — HYDROCORTISONE ACETATE 25 MG RE SUPP: RECTAL | 0 refills | Status: DC

## 2015-10-10 MED ORDER — HYDROCORTISONE 2.5 % RE CREA: TOPICAL_CREAM | RECTAL | 0 refills | Status: AC

## 2015-10-10 NOTE — Assessment & Plan Note (Signed)
1 small, non thrombosed external hemorrhoid noted today. Tender. No erythema. Refills provided for Anusol cream and suppositories.

## 2015-10-10 NOTE — Progress Notes (Signed)
Pre visit review using our clinic review tool, if applicable. No additional management support is needed unless otherwise documented below in the visit note. 

## 2015-10-10 NOTE — Addendum Note (Signed)
Addended by: Jacqualin Combes on: 10/10/2015 09:19 AM   Modules accepted: Orders

## 2015-10-10 NOTE — Progress Notes (Signed)
Subjective:    Patient ID: Rebecca [name], female    DOB: 1955/03/12, 60 y.o.   MRN: [id number]  HPI  [name] is a 60 year old female with a history of internal and external hemorrhoids who presents today with a chief complaint of rectal bleeding and rectal pain. Her symptoms have been present intermittently since August. Her bleeding is present while at work when she's experiencing a bowel movement, she doesn't experience this bleeding during the weekends. She has daily bowel movements, denies constipation.  She's under a lot of both personal and work stress, is unable to use the bathroom very frequently at work as she is a Education officer, museum. She plans on retiring at the end of this month, but is requesting a leave of absence due to the demands of her job, increased stress, and frequency of hemorrhoid flares. She's not been using her Anusol suppositories or cream as she's recently moved and cannot find her medications. The pharmacy notified her that the prescriptions were out of date. Denies weakness, fatigue, fevers, rectal bleeding today.  Review of Systems  Constitutional: Negative for chills, fatigue and fever.  Gastrointestinal: Positive for blood in stool. Negative for abdominal pain, constipation, diarrhea and nausea.  Neurological: Negative for weakness.  Psychiatric/Behavioral:       Stressed with personal and occupational demands.       Past Medical History:  Diagnosis Date  . Heart murmur    noted on PCP exam 11/23/2012; "does not appear urgent"  . Hemorrhoids   . Hypertension    under control with meds., has been on med. > 20 yr.  . Radius fracture 11/22/2012   left, comminuted     Social History   Social History  . Marital status: Divorced    Spouse name: N/A  . Number of children: 2  . Years of education: N/A   Occupational History  . School teacher, also going to school for teaching degree    Social History Main Topics  . Smoking status: Never Smoker  .  Smokeless tobacco: Never Used  . Alcohol use No  . Drug use: No  . Sexual activity: Not on file   Other Topics Concern  . Not on file   Social History Narrative   Regular exercise    Past Surgical History:  Procedure Laterality Date  . ABDOMINAL HYSTERECTOMY  06/02/2000   partial  . COLONOSCOPY  03/2006  . COLONOSCOPY WITH PROPOFOL  11/15/2011  . ENDOMETRIAL ABLATION    . OPEN REDUCTION INTERNAL FIXATION (ORIF) DISTAL RADIAL FRACTURE Left 11/29/2012   Procedure: OPEN REDUCTION INTERNAL FIXATION (ORIF) LEFT  DISTAL RADIAL FRACTURE;  Surgeon: Wynonia Sours, MD;  Location: [facility];  Service: Orthopedics;  Laterality: Left;  . PARTIAL HYSTERECTOMY      Family History  Problem Relation Age of Onset  . Hypertension Father   . Coronary artery disease Father     with CABG  . Hypertension Mother   . Colon cancer Mother   . Diabetes Mother   . Breast cancer Maternal Aunt   . Diabetes Maternal Aunt   . Colon cancer Maternal Uncle   . Colon cancer Paternal Uncle     Allergies  Allergen Reactions  . Amoxicillin Hives and Swelling  . Spironolactone Diarrhea  . Augmentin [Amoxicillin-Pot Clavulanate] Diarrhea    Tore stomach up    Current Outpatient Prescriptions on File Prior to Visit  Medication Sig Dispense Refill  . amLODipine-benazepril (LOTREL) 5-20 MG  capsule TAKE 1 CAPSULE BY MOUTH DAILY 30 capsule 6  . fluticasone (FLONASE) 50 MCG/ACT nasal spray Place 2 sprays into both nostrils daily. 16 g 1  . hydrochlorothiazide (HYDRODIURIL) 50 MG tablet Take 1 tablet (50 mg total) by mouth daily. MUST SCHEDULE ANNUAL PHYSICAL 30 tablet 5  . latanoprost (XALATAN) 0.005 % ophthalmic solution Place 1 drop into both eyes.     . Multiple Vitamin (MULTIVITAMIN) tablet Take 1 tablet by mouth daily.      . Omega-3 Fatty Acids (FISH OIL CONCENTRATE PO) Take 1 capsule by mouth daily.     . potassium chloride (K-DUR) 10 MEQ tablet TAKE 1 TABLET BY MOUTH DAILY 30 tablet 10    No current facility-administered medications on file prior to visit.     BP 126/78   Pulse 66   Temp 97.7 F (36.5 C) (Oral)   Ht 5\' 7"  (1.702 m)   Wt 215 lb 12.8 oz (97.9 kg)   SpO2 98%   BMI 33.80 kg/m    Objective:   Physical Exam  Constitutional: She appears well-nourished.  Neck: Neck supple.  Cardiovascular: Normal rate and regular rhythm.   Pulmonary/Chest: Effort normal and breath sounds normal.  Abdominal: Soft. Bowel sounds are normal. There is no tenderness.  Genitourinary: Rectal exam shows external hemorrhoid, internal hemorrhoid and tenderness. Rectal exam shows guaiac negative stool.  Skin: Skin is warm and dry.  Psychiatric: She has a normal mood and affect.          Assessment & Plan:

## 2015-10-10 NOTE — Patient Instructions (Signed)
I sent refills of both the Anusol cream and suppositories to your pharmacy.   Please notify us if no improvement within the next 3-4 days.  It was a pleasure to see you today!  Hemorrhoids Hemorrhoids are swollen veins around the rectum or anus. There are two types of hemorrhoids:   Internal hemorrhoids. These occur in the veins just inside the rectum. They may poke through to the outside and become irritated and painful.  External hemorrhoids. These occur in the veins outside the anus and can be felt as a painful swelling or hard lump near the anus. CAUSES  Pregnancy.   Obesity.   Constipation or diarrhea.   Straining to have a bowel movement.   Sitting for long periods on the toilet.  Heavy lifting or other activity that caused you to strain.  Anal intercourse. SYMPTOMS   Pain.   Anal itching or irritation.   Rectal bleeding.   Fecal leakage.   Anal swelling.   One or more lumps around the anus.  DIAGNOSIS  Your caregiver may be able to diagnose hemorrhoids by visual examination. Other examinations or tests that may be performed include:   Examination of the rectal area with a gloved hand (digital rectal exam).   Examination of anal canal using a small tube (scope).   A blood test if you have lost a significant amount of blood.  A test to look inside the colon (sigmoidoscopy or colonoscopy). TREATMENT Most hemorrhoids can be treated at home. However, if symptoms do not seem to be getting better or if you have a lot of rectal bleeding, your caregiver may perform a procedure to help make the hemorrhoids get smaller or remove them completely. Possible treatments include:   Placing a rubber band at the base of the hemorrhoid to cut off the circulation (rubber band ligation).   Injecting a chemical to shrink the hemorrhoid (sclerotherapy).   Using a tool to burn the hemorrhoid (infrared light therapy).   Surgically removing the hemorrhoid  (hemorrhoidectomy).   Stapling the hemorrhoid to block blood flow to the tissue (hemorrhoid stapling).  HOME CARE INSTRUCTIONS   Eat foods with fiber, such as whole grains, beans, nuts, fruits, and vegetables. Ask your doctor about taking products with added fiber in them (fibersupplements).  Increase fluid intake. Drink enough water and fluids to keep your urine clear or pale yellow.   Exercise regularly.   Go to the bathroom when you have the urge to have a bowel movement. Do not wait.   Avoid straining to have bowel movements.   Keep the anal area dry and clean. Use wet toilet paper or moist towelettes after a bowel movement.   Medicated creams and suppositories may be used or applied as directed.   Only take over-the-counter or prescription medicines as directed by your caregiver.   Take warm sitz baths for 15-20 minutes, 3-4 times a day to ease pain and discomfort.   Place ice packs on the hemorrhoids if they are tender and swollen. Using ice packs between sitz baths may be helpful.   Put ice in a plastic bag.   Place a towel between your skin and the bag.   Leave the ice on for 15-20 minutes, 3-4 times a day.   Do not use a donut-shaped pillow or sit on the toilet for long periods. This increases blood pooling and pain.  SEEK MEDICAL CARE IF:  You have increasing pain and swelling that is not controlled by treatment or medicine.  You have uncontrolled bleeding.  You have difficulty or you are unable to have a bowel movement.  You have pain or inflammation outside the area of the hemorrhoids. MAKE SURE YOU:  Understand these instructions.  Will watch your condition.  Will get help right away if you are not doing well or get worse.   This information is not intended to replace advice given to you by your health care provider. Make sure you discuss any questions you have with your health care provider.   Document Released: 12/19/1999 Document  Revised: 12/08/2011 Document Reviewed: 10/26/2011 Elsevier Interactive Patient Education [employer].

## 2015-10-10 NOTE — Assessment & Plan Note (Signed)
Evidence of 1 small internal hemorrhoid upon exam. Hemoccult stool card negative. No obvious bleeding. Tender. Refills provided for anusol suppositories and cream.

## 2015-10-13 ENCOUNTER — Telehealth: Payer: Self-pay | Admitting: Primary Care

## 2015-10-13 NOTE — Telephone Encounter (Signed)
Pt saw Anda Kraft on 10/6 for hemorrhoids and wanted to get fmla paperwork filled out  Anda Kraft wanted to see if you could fill out the paperwork since you have seen patient for this before.  Paperwork in dr tower's in box

## 2015-10-13 NOTE — Telephone Encounter (Signed)
Pt was seen Fri 10/6. Pt would like to know if paperwork has been completed. Please call pt on home phone   Thanks

## 2015-10-14 NOTE — Telephone Encounter (Signed)
Left detailed message on voicemail asking for return call for response. 

## 2015-10-14 NOTE — Telephone Encounter (Signed)
There is a question regarding job function- are there any job functions she cannot do because of this medical problem  (hemorrhoids)  -let me know  How often does she usually have flare ups and does she usually miss work due to these (if so from bleeding or pain or both?)-  Thanks - please ask her these and I am holding paperwork

## 2015-10-15 NOTE — Telephone Encounter (Signed)
Pt left v/m about FMLA; pt pain effects daily work; pt has burning, bleeding, pain and stinging a lot; has bowel incontinence at times. Pt has a lot of stress at job and pt plans to retire at end of this month. Pt also has family issues with pts parents who have dementia and new baby in family that is premature. If pt is not at work does not experience the burning and bleeding. Pt request cb.

## 2015-10-15 NOTE — Telephone Encounter (Signed)
Ok- so that answers question 1 (she cannot perform at all at work when the problem is flared)   I still need to know the average frequency and duration of flare ups for the next question --- how often do you get a flare up of this condition (hemorrhoids) and how long does a flare up usually last?   These are the questions I need to answer for the FMLA form and unfortunately I have to be very specific Thanks

## 2015-10-16 NOTE — Telephone Encounter (Signed)
Spoke to pt who states her hemorrhoids flare and clear within a days time, but it happens appx every 2 days.

## 2015-10-17 DIAGNOSIS — Z7689 Persons encountering health services in other specified circumstances: Secondary | ICD-10-CM

## 2015-10-17 NOTE — Telephone Encounter (Signed)
Paperwork done and in IN box

## 2015-10-17 NOTE — Telephone Encounter (Signed)
Paper work given to [employer]

## 2015-10-20 NOTE — Telephone Encounter (Signed)
Paperwork faxed 10/13 Pt aware Copy for pt Copy for file Copy for billing] Copy for scan

## 2015-11-21 ENCOUNTER — Other Ambulatory Visit: Payer: Self-pay | Admitting: Family Medicine

## 2015-11-30 ENCOUNTER — Other Ambulatory Visit: Payer: Self-pay | Admitting: Family Medicine

## 2015-12-16 ENCOUNTER — Encounter: Payer: Self-pay | Admitting: Family Medicine

## 2015-12-16 ENCOUNTER — Ambulatory Visit (INDEPENDENT_AMBULATORY_CARE_PROVIDER_SITE_OTHER): Payer: BC Managed Care – PPO | Admitting: Family Medicine

## 2015-12-16 ENCOUNTER — Ambulatory Visit (INDEPENDENT_AMBULATORY_CARE_PROVIDER_SITE_OTHER)
Admission: RE | Admit: 2015-12-16 | Discharge: 2015-12-16 | Disposition: A | Discharge status: Home / Self Care | Payer: BC Managed Care – PPO | Source: Ambulatory Visit | Attending: Family Medicine | Admitting: Family Medicine

## 2015-12-16 VITALS — BP 122/78 | HR 75 | Temp 98.4°F | Ht 67.0 in | Wt 228.5 lb

## 2015-12-16 DIAGNOSIS — W19XXXA Unspecified fall, initial encounter: Secondary | ICD-10-CM

## 2015-12-16 DIAGNOSIS — M25531 Pain in right wrist: Secondary | ICD-10-CM

## 2015-12-16 DIAGNOSIS — S62111A Displaced fracture of triquetrum [cuneiform] bone, right wrist, initial encounter for closed fracture: Secondary | ICD-10-CM | POA: Diagnosis not present

## 2015-12-16 DIAGNOSIS — M25431 Effusion, right wrist: Secondary | ICD-10-CM

## 2015-12-16 DIAGNOSIS — Z23 Encounter for immunization: Secondary | ICD-10-CM

## 2015-12-16 MED ORDER — HYDROCHLOROTHIAZIDE 50 MG PO TABS: 50.0000 mg | ORAL_TABLET | Freq: Every day | ORAL | 0 refills | Status: DC

## 2015-12-16 NOTE — Assessment & Plan Note (Signed)
Possible per X-ray.Marland Kitchen Refer to [facility] for definitive treatment.  Use nsaid, ice and elevation till see by [facility].

## 2015-12-16 NOTE — Progress Notes (Signed)
Pre visit review using our clinic review tool, if applicable. No additional management support is needed unless otherwise documented below in the visit note. 

## 2015-12-16 NOTE — Patient Instructions (Signed)
Stop at front desk to get set up with referral to [facility] ASAP.
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
## 2016-02-10 ENCOUNTER — Other Ambulatory Visit: Payer: Self-pay | Admitting: *Deleted

## 2016-02-10 MED ORDER — HYDROCHLOROTHIAZIDE 50 MG PO TABS: 50.0000 mg | ORAL_TABLET | Freq: Every day | ORAL | 0 refills | Status: DC

## 2016-02-10 MED ORDER — FLUTICASONE PROPIONATE 50 MCG/ACT NA SUSP: 2.0000 | Freq: Every day | NASAL | 1 refills | Status: DC | PRN

## 2016-02-10 MED ORDER — POTASSIUM CHLORIDE ER 10 MEQ PO TBCR: 10.0000 meq | EXTENDED_RELEASE_TABLET | Freq: Every day | ORAL | 0 refills | Status: DC

## 2016-02-10 MED ORDER — AMLODIPINE BESY-BENAZEPRIL HCL 5-20 MG PO CAPS: 1.0000 | ORAL_CAPSULE | Freq: Every day | ORAL | 0 refills | Status: DC

## 2016-02-10 NOTE — Telephone Encounter (Signed)
Pt has a new pharmacy, Rxs sent

## 2016-02-13 ENCOUNTER — Other Ambulatory Visit: Payer: Self-pay | Admitting: Family Medicine

## 2016-02-13 DIAGNOSIS — E876 Hypokalemia: Secondary | ICD-10-CM

## 2016-02-13 NOTE — Telephone Encounter (Signed)
Please schedule a 30 min f/u April or later and revill until then Thanks

## 2016-02-13 NOTE — Telephone Encounter (Signed)
Pt has had a few acute appts with other providers but no recent/future f/u or CPE, please advise

## 2016-03-11 ENCOUNTER — Other Ambulatory Visit: Payer: Self-pay | Admitting: *Deleted

## 2016-03-11 DIAGNOSIS — E876 Hypokalemia: Secondary | ICD-10-CM

## 2016-03-11 MED ORDER — AMLODIPINE BESY-BENAZEPRIL HCL 5-20 MG PO CAPS: 1.0000 | ORAL_CAPSULE | Freq: Every day | ORAL | 1 refills | Status: DC

## 2016-03-11 MED ORDER — POTASSIUM CHLORIDE ER 10 MEQ PO TBCR: 10.0000 meq | EXTENDED_RELEASE_TABLET | Freq: Every day | ORAL | 1 refills | Status: DC

## 2016-03-11 NOTE — Telephone Encounter (Signed)
Rx were sent to [facility] but now pt's using [facility], new Rx sent to [facility] pharmacy

## 2016-04-23 ENCOUNTER — Other Ambulatory Visit: Payer: Self-pay | Admitting: Family Medicine

## 2016-04-23 DIAGNOSIS — Z1231 Encounter for screening mammogram for malignant neoplasm of breast: Secondary | ICD-10-CM

## 2016-04-27 ENCOUNTER — Ambulatory Visit (INDEPENDENT_AMBULATORY_CARE_PROVIDER_SITE_OTHER): Payer: BC Managed Care – PPO | Admitting: Family Medicine

## 2016-04-27 ENCOUNTER — Encounter: Payer: Self-pay | Admitting: Family Medicine

## 2016-04-27 ENCOUNTER — Encounter (INDEPENDENT_AMBULATORY_CARE_PROVIDER_SITE_OTHER): Payer: Self-pay

## 2016-04-27 VITALS — BP 126/78 | HR 67 | Temp 97.5°F | Ht 67.0 in | Wt 224.2 lb

## 2016-04-27 DIAGNOSIS — E876 Hypokalemia: Secondary | ICD-10-CM | POA: Diagnosis not present

## 2016-04-27 DIAGNOSIS — B351 Tinea unguium: Secondary | ICD-10-CM

## 2016-04-27 DIAGNOSIS — I10 Essential (primary) hypertension: Secondary | ICD-10-CM

## 2016-04-27 DIAGNOSIS — Z6835 Body mass index (BMI) 35.0-35.9, adult: Secondary | ICD-10-CM | POA: Diagnosis not present

## 2016-04-27 DIAGNOSIS — E7800 Pure hypercholesterolemia, unspecified: Secondary | ICD-10-CM

## 2016-04-27 DIAGNOSIS — R739 Hyperglycemia, unspecified: Secondary | ICD-10-CM

## 2016-04-27 LAB — COMPREHENSIVE METABOLIC PANEL
ALT: 25 U/L (ref 0–35)
AST: 19 U/L (ref 0–37)
Albumin: 4.3 g/dL (ref 3.5–5.2)
Alkaline Phosphatase: 50 U/L (ref 39–117)
BUN: 15 mg/dL (ref 6–23)
CO2: 30 mEq/L (ref 19–32)
Calcium: 9.6 mg/dL (ref 8.4–10.5)
Chloride: 101 mEq/L (ref 96–112)
Creatinine, Ser: 0.78 mg/dL (ref 0.40–1.20)
GFR: 95.93 mL/min (ref >60.00)
Glucose, Bld: 118 mg/dL — ABNORMAL HIGH (ref 70–99)
Potassium: 3.9 mEq/L (ref 3.5–5.1)
Sodium: 138 mEq/L (ref 135–145)
Total Bilirubin: 0.5 mg/dL (ref 0.2–1.2)
Total Protein: 7.2 g/dL (ref 6.0–8.3)

## 2016-04-27 LAB — CBC WITH DIFFERENTIAL/PLATELET
Basophils Absolute: 0 10*3/uL (ref 0.0–0.1)
Basophils Relative: 1.1 % (ref 0.0–3.0)
Eosinophils Absolute: 0.1 10*3/uL (ref 0.0–0.7)
Eosinophils Relative: 2.8 % (ref 0.0–5.0)
HCT: 39.8 % (ref 36.0–46.0)
Hemoglobin: 13.4 g/dL (ref 12.0–15.0)
Lymphocytes Relative: 44.2 % (ref 12.0–46.0)
Lymphs Abs: 1.5 10*3/uL (ref 0.7–4.0)
MCHC: 33.8 g/dL (ref 30.0–36.0)
MCV: 92.2 fl (ref 78.0–100.0)
Monocytes Absolute: 0.3 10*3/uL (ref 0.1–1.0)
Monocytes Relative: 9.2 % (ref 3.0–12.0)
Neutro Abs: 1.5 10*3/uL (ref 1.4–7.7)
Neutrophils Relative %: 42.7 % — ABNORMAL LOW (ref 43.0–77.0)
Platelets: 310 10*3/uL (ref 150.0–400.0)
RBC: 4.31 Mil/uL (ref 3.87–5.11)
RDW: 13.5 % (ref 11.5–15.5)
WBC: 3.4 10*3/uL — ABNORMAL LOW (ref 4.0–10.5)

## 2016-04-27 LAB — LIPID PANEL
Cholesterol: 217 mg/dL — ABNORMAL HIGH (ref 0–200)
HDL: 60.9 mg/dL (ref >39.00)
LDL Cholesterol: 138 mg/dL — ABNORMAL HIGH (ref 0–99)
NonHDL: 156.32
Total CHOL/HDL Ratio: 4
Triglycerides: 91 mg/dL (ref 0.0–149.0)
VLDL: 18.2 mg/dL (ref 0.0–40.0)

## 2016-04-27 LAB — HEMOGLOBIN A1C: Hgb A1c MFr Bld: 6.2 % (ref 4.6–6.5)

## 2016-04-27 LAB — TSH: TSH: 1.33 u[IU]/mL (ref 0.35–4.50)

## 2016-04-27 MED ORDER — HYDROCHLOROTHIAZIDE 50 MG PO TABS: 50.0000 mg | ORAL_TABLET | Freq: Every day | ORAL | 11 refills | Status: DC

## 2016-04-27 MED ORDER — AMLODIPINE BESY-BENAZEPRIL HCL 5-20 MG PO CAPS: 1.0000 | ORAL_CAPSULE | Freq: Every day | ORAL | 11 refills | Status: DC

## 2016-04-27 MED ORDER — POTASSIUM CHLORIDE ER 10 MEQ PO TBCR: 10.0000 meq | EXTENDED_RELEASE_TABLET | Freq: Every day | ORAL | 11 refills | Status: DC

## 2016-04-27 NOTE — Progress Notes (Signed)
Pre visit review using our clinic review tool, if applicable. No additional management support is needed unless otherwise documented below in the visit note. 

## 2016-04-27 NOTE — Assessment & Plan Note (Signed)
bp in fair control at this time  BP Readings from Last 1 Encounters:  04/27/16 126/78   No changes needed Disc lifstyle change with low sodium diet and exercise  Labs today  Wt loss enc

## 2016-04-27 NOTE — Assessment & Plan Note (Signed)
Discussed how this problem influences overall health and the risks it imposes  Reviewed plan for weight loss with lower calorie diet (via better food choices and also portion control or program like weight watchers) and exercise building up to or more than 30 minutes 5 days per week including some aerobic activity    

## 2016-04-27 NOTE — Assessment & Plan Note (Signed)
Lab today Taking low dose daily K

## 2016-04-27 NOTE — Assessment & Plan Note (Signed)
Ref to podiatry for debridement and disc of treatment Disc d/c 2nd hand shoes and also pedicures

## 2016-04-27 NOTE — Assessment & Plan Note (Signed)
Lab today  disc imp of low glycemic diet and wt loss to prevent DM2  Strong family hx of DM

## 2016-04-27 NOTE — Patient Instructions (Addendum)
For cholesterol   Avoid red meat/ fried foods/ egg yolks/ fatty breakfast meats/ butter, cheese and high fat dairy/ and shellfish    For prediabetes- try to get your carbs from produce (sweet potato instead of white potato) and avoid bread and pasta and snack foods and rick   Keep walking  We will refer you to podiatry for help with toe nails   Get your mammogram as planned  Get your colonoscopy as planned  Labs today
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
## 2016-04-27 NOTE — Assessment & Plan Note (Signed)
Lab today  Disc goals for lipids and reasons to control them Rev labs with pt (last time) Rev low sat fat diet in detail  

## 2016-05-14 ENCOUNTER — Ambulatory Visit
Admission: RE | Admit: 2016-05-14 | Discharge: 2016-05-14 | Disposition: A | Discharge status: Home / Self Care | Payer: BC Managed Care – PPO | Source: Ambulatory Visit | Attending: Family Medicine | Admitting: Family Medicine

## 2016-05-14 ENCOUNTER — Ambulatory Visit: Payer: BC Managed Care – PPO | Admitting: Podiatry

## 2016-05-14 DIAGNOSIS — Z1231 Encounter for screening mammogram for malignant neoplasm of breast: Secondary | ICD-10-CM

## 2016-05-26 ENCOUNTER — Other Ambulatory Visit: Payer: Self-pay | Admitting: Family Medicine

## 2016-05-26 DIAGNOSIS — E876 Hypokalemia: Secondary | ICD-10-CM

## 2016-09-02 ENCOUNTER — Telehealth: Payer: Self-pay | Admitting: Family Medicine

## 2016-09-02 DIAGNOSIS — R739 Hyperglycemia, unspecified: Secondary | ICD-10-CM

## 2016-09-02 DIAGNOSIS — E7800 Pure hypercholesterolemia, unspecified: Secondary | ICD-10-CM

## 2016-09-02 DIAGNOSIS — I10 Essential (primary) hypertension: Secondary | ICD-10-CM

## 2016-09-02 NOTE — Telephone Encounter (Signed)
-----   Message from [name] sent at 09/01/2016 11:40 AM EDT ----- Regarding: Lab orders for Thursday, 9.6.18 Patient is scheduled for CPX labs, please order future labs, Thanks , Karna Christmas

## 2016-09-09 ENCOUNTER — Other Ambulatory Visit (INDEPENDENT_AMBULATORY_CARE_PROVIDER_SITE_OTHER): Payer: BC Managed Care – PPO

## 2016-09-09 DIAGNOSIS — I10 Essential (primary) hypertension: Secondary | ICD-10-CM | POA: Diagnosis not present

## 2016-09-09 DIAGNOSIS — E7800 Pure hypercholesterolemia, unspecified: Secondary | ICD-10-CM

## 2016-09-09 DIAGNOSIS — R739 Hyperglycemia, unspecified: Secondary | ICD-10-CM | POA: Diagnosis not present

## 2016-09-09 LAB — LIPID PANEL
Cholesterol: 205 mg/dL — ABNORMAL HIGH (ref 0–200)
HDL: 52.5 mg/dL (ref >39.00)
LDL Cholesterol: 135 mg/dL — ABNORMAL HIGH (ref 0–99)
NonHDL: 152.53
Total CHOL/HDL Ratio: 4
Triglycerides: 88 mg/dL (ref 0.0–149.0)
VLDL: 17.6 mg/dL (ref 0.0–40.0)

## 2016-09-09 LAB — COMPREHENSIVE METABOLIC PANEL
ALT: 18 U/L (ref 0–35)
AST: 23 U/L (ref 0–37)
Albumin: 4.1 g/dL (ref 3.5–5.2)
Alkaline Phosphatase: 49 U/L (ref 39–117)
BUN: 13 mg/dL (ref 6–23)
CO2: 28 mEq/L (ref 19–32)
Calcium: 9.2 mg/dL (ref 8.4–10.5)
Chloride: 101 mEq/L (ref 96–112)
Creatinine, Ser: 0.74 mg/dL (ref 0.40–1.20)
GFR: 101.82 mL/min (ref >60.00)
Glucose, Bld: 114 mg/dL — ABNORMAL HIGH (ref 70–99)
Potassium: 3.5 mEq/L (ref 3.5–5.1)
Sodium: 137 mEq/L (ref 135–145)
Total Bilirubin: 0.5 mg/dL (ref 0.2–1.2)
Total Protein: 6.9 g/dL (ref 6.0–8.3)

## 2016-09-09 LAB — CBC WITH DIFFERENTIAL/PLATELET
Basophils Absolute: 0 10*3/uL (ref 0.0–0.1)
Basophils Relative: 0.9 % (ref 0.0–3.0)
Eosinophils Absolute: 0.1 10*3/uL (ref 0.0–0.7)
Eosinophils Relative: 3.8 % (ref 0.0–5.0)
HCT: 39.2 % (ref 36.0–46.0)
Hemoglobin: 13.1 g/dL (ref 12.0–15.0)
Lymphocytes Relative: 50.8 % — ABNORMAL HIGH (ref 12.0–46.0)
Lymphs Abs: 1.6 10*3/uL (ref 0.7–4.0)
MCHC: 33.6 g/dL (ref 30.0–36.0)
MCV: 92.4 fl (ref 78.0–100.0)
Monocytes Absolute: 0.4 10*3/uL (ref 0.1–1.0)
Monocytes Relative: 11.2 % (ref 3.0–12.0)
Neutro Abs: 1.1 10*3/uL — ABNORMAL LOW (ref 1.4–7.7)
Neutrophils Relative %: 33.3 % — ABNORMAL LOW (ref 43.0–77.0)
Platelets: 283 10*3/uL (ref 150.0–400.0)
RBC: 4.24 Mil/uL (ref 3.87–5.11)
RDW: 12.8 % (ref 11.5–15.5)
WBC: 3.2 10*3/uL — ABNORMAL LOW (ref 4.0–10.5)

## 2016-09-09 LAB — TSH: TSH: 2.2 u[IU]/mL (ref 0.35–4.50)

## 2016-09-09 LAB — HEMOGLOBIN A1C: Hgb A1c MFr Bld: 6.1 % (ref 4.6–6.5)

## 2016-09-14 ENCOUNTER — Encounter: Payer: Self-pay | Admitting: Internal Medicine

## 2016-09-14 ENCOUNTER — Telehealth: Payer: Self-pay | Admitting: Family Medicine

## 2016-09-14 ENCOUNTER — Encounter: Payer: Self-pay | Admitting: Family Medicine

## 2016-09-14 ENCOUNTER — Ambulatory Visit (INDEPENDENT_AMBULATORY_CARE_PROVIDER_SITE_OTHER): Payer: BC Managed Care – PPO | Admitting: Family Medicine

## 2016-09-14 VITALS — BP 126/68 | HR 70 | Temp 97.8°F | Ht 66.5 in | Wt 224.0 lb

## 2016-09-14 DIAGNOSIS — Z0000 Encounter for general adult medical examination without abnormal findings: Secondary | ICD-10-CM

## 2016-09-14 DIAGNOSIS — R739 Hyperglycemia, unspecified: Secondary | ICD-10-CM

## 2016-09-14 DIAGNOSIS — E7800 Pure hypercholesterolemia, unspecified: Secondary | ICD-10-CM

## 2016-09-14 DIAGNOSIS — Z800 Family history of malignant neoplasm of digestive organs: Secondary | ICD-10-CM

## 2016-09-14 DIAGNOSIS — I10 Essential (primary) hypertension: Secondary | ICD-10-CM

## 2016-09-14 DIAGNOSIS — E876 Hypokalemia: Secondary | ICD-10-CM

## 2016-09-14 DIAGNOSIS — Z23 Encounter for immunization: Secondary | ICD-10-CM

## 2016-09-14 DIAGNOSIS — Z6835 Body mass index (BMI) 35.0-35.9, adult: Secondary | ICD-10-CM

## 2016-09-14 MED ORDER — POTASSIUM CHLORIDE ER 10 MEQ PO TBCR: 10.0000 meq | EXTENDED_RELEASE_TABLET | Freq: Every day | ORAL | 11 refills | Status: DC

## 2016-09-14 MED ORDER — AMLODIPINE BESY-BENAZEPRIL HCL 5-20 MG PO CAPS
1.0000 | ORAL_CAPSULE | Freq: Every day | ORAL | 11 refills | Status: DC
Start: 2016-09-14 — End: 2017-11-14

## 2016-09-14 MED ORDER — HYDROCHLOROTHIAZIDE 50 MG PO TABS: 50.0000 mg | ORAL_TABLET | Freq: Every day | ORAL | 11 refills | Status: DC

## 2016-09-14 NOTE — Assessment & Plan Note (Signed)
Discussed how this problem influences overall health and the risks it imposes  Reviewed plan for weight loss with lower calorie diet (via better food choices and also portion control or program like weight watchers) and exercise building up to or more than 30 minutes 5 days per week including some aerobic activity    

## 2016-09-14 NOTE — Assessment & Plan Note (Signed)
Reviewed health habits including diet and exercise and skin cancer prevention Reviewed appropriate screening tests for age  Also reviewed health mt list, fam hx and immunization status , as well as social and family history   See HPI Labs reviewed  Ref for colonoscopy due in November Flu shot today  Enc wt loss with healthy habits and better diet  Pt is interested in Shingrix when it is available
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
## 2016-09-14 NOTE — Assessment & Plan Note (Signed)
Disc goals for lipids and reasons to control them Rev labs with pt Rev low sat fat diet in detail LDL is up  Info/handout given on diet  If no imp in next check/consider statin

## 2016-09-14 NOTE — Telephone Encounter (Signed)
Left message asking pt to call the office regarding gi referral Pt went to [facility] prior is it ok to go back to them If so referral will be put on workque and they will contact pt

## 2016-09-14 NOTE — Assessment & Plan Note (Signed)
Due ror recall in November Referral made

## 2016-09-14 NOTE — Patient Instructions (Addendum)
There is a new shingles vaccine called shingrix  It is out of stock now - should be available in 6-12 months  You can check with insurance about coverage   We will refer you for your colonoscopy   Your cholesterol is high  Avoid red meat/ fried foods/ egg yolks/ fatty breakfast meats/ butter, cheese and high fat dairy/ and shellfish    For blood sugar  Try to get most of your carbohydrates from produce (with the exception of white potatoes)  Eat less bread/pasta/rice/snack foods/cereals/sweets and other items from the middle of the grocery store (processed carbs)  Keep exercising

## 2016-09-14 NOTE — Assessment & Plan Note (Signed)
bp in fair control at this time  BP Readings from Last 1 Encounters:  09/14/16 126/68   No changes needed Disc lifstyle change with low sodium diet and exercise  Labs rev  Wt loss enc

## 2016-09-14 NOTE — Assessment & Plan Note (Signed)
K is controlled with oral supplementation

## 2016-09-14 NOTE — Assessment & Plan Note (Signed)
Lab Results  Component Value Date   HGBA1C 6.1 09/09/2016   disc imp of low glycemic diet and wt loss to prevent DM2

## 2016-11-02 ENCOUNTER — Encounter: Payer: Self-pay | Admitting: Family Medicine

## 2016-11-02 ENCOUNTER — Ambulatory Visit (INDEPENDENT_AMBULATORY_CARE_PROVIDER_SITE_OTHER): Payer: BC Managed Care – PPO | Admitting: Family Medicine

## 2016-11-02 VITALS — BP 124/72 | HR 77 | Temp 97.4°F | Ht 66.5 in | Wt 222.0 lb

## 2016-11-02 DIAGNOSIS — Z021 Encounter for pre-employment examination: Secondary | ICD-10-CM | POA: Insufficient documentation

## 2016-11-02 DIAGNOSIS — Z111 Encounter for screening for respiratory tuberculosis: Secondary | ICD-10-CM
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
## 2016-11-02 NOTE — Assessment & Plan Note (Signed)
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

## 2016-11-02 NOTE — Patient Instructions (Signed)
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

## 2016-11-04 ENCOUNTER — Telehealth: Payer: Self-pay | Admitting: Family Medicine

## 2016-11-04 LAB — TB SKIN TEST
Induration: 0 mm
TB Skin Test: NEGATIVE

## 2016-11-09 ENCOUNTER — Encounter: Payer: Self-pay | Admitting: Internal Medicine

## 2016-11-09 ENCOUNTER — Ambulatory Visit (AMBULATORY_SURGERY_CENTER): Payer: Self-pay | Admitting: *Deleted

## 2016-11-09 VITALS — Ht 67.0 in | Wt 221.0 lb

## 2016-11-09 DIAGNOSIS — Z800 Family history of malignant neoplasm of digestive organs: Secondary | ICD-10-CM

## 2016-11-09 MED ORDER — NA SULFATE-K SULFATE-MG SULF 17.5-3.13-1.6 GM/177ML PO SOLN: 1.0000 | Freq: Once | ORAL | 0 refills | Status: AC

## 2016-11-09 NOTE — Progress Notes (Signed)
No egg or soy allergy known to patient  No issues with past sedation with any surgeries  or procedures, no intubation problems  No diet pills per patient No home 02 use per patient  No blood thinners per patient  Pt denies issues with constipation  No A fib or A flutter  EMMI video sent to pt's e mail  $15 coupon to pt for suprep in [place] today

## 2016-11-23 ENCOUNTER — Other Ambulatory Visit: Payer: Self-pay

## 2016-11-23 ENCOUNTER — Ambulatory Visit (AMBULATORY_SURGERY_CENTER): Payer: BC Managed Care – PPO | Admitting: Internal Medicine

## 2016-11-23 ENCOUNTER — Encounter: Payer: Self-pay | Admitting: Internal Medicine

## 2016-11-23 VITALS — BP 114/70 | HR 55 | Temp 97.8°F | Resp 14 | Ht 66.5 in | Wt 222.0 lb

## 2016-11-23 DIAGNOSIS — Z800 Family history of malignant neoplasm of digestive organs: Secondary | ICD-10-CM | POA: Diagnosis present

## 2016-11-23 DIAGNOSIS — Z1212 Encounter for screening for malignant neoplasm of rectum: Secondary | ICD-10-CM

## 2016-11-23 DIAGNOSIS — Z1211 Encounter for screening for malignant neoplasm of colon: Secondary | ICD-10-CM

## 2016-11-23 MED ORDER — SODIUM CHLORIDE 0.9 % IV SOLN: 500.0000 mL | INTRAVENOUS | Status: DC

## 2016-11-23 MED ORDER — HYOSCYAMINE SULFATE 0.125 MG SL SUBL: 0.1250 mg | SUBLINGUAL_TABLET | SUBLINGUAL | 1 refills | Status: DC | PRN

## 2016-11-23 NOTE — Op Note (Signed)
[name]: Rebecca [name] Procedure Date: 11/23/2016 10:33 AM MRN: [id number] Endoscopist: Jerene Bears , MD Age: 61 Referring MD:  Date of Birth: March 17, 1955 Gender: Female Account #: [id number] Procedure:                Colonoscopy Indications:              Screening in patient at increased risk: Family                            history of 1st-degree relative with colorectal                            cancer, Last colonoscopy 5 years ago Medicines:                Monitored Anesthesia Care Procedure:                Pre-Anesthesia Assessment:                           - Prior to the procedure, a History and Physical                            was performed, and patient medications and                            allergies were reviewed. The patient's tolerance of                            previous anesthesia was also reviewed. The risks                            and benefits of the procedure and the sedation                            options and risks were discussed with the patient.                            All questions were answered, and informed consent                            was obtained. Prior Anticoagulants: The patient has                            taken no previous anticoagulant or antiplatelet                            agents. ASA Grade Assessment: II - A patient with                            mild systemic disease. After reviewing the risks                            and benefits, the patient was deemed in  satisfactory condition to undergo the procedure.                           After obtaining informed consent, the colonoscope                            was passed under direct vision. Throughout the                            procedure, the patient's blood pressure, pulse, and                            oxygen saturations were monitored continuously. The                            Colonoscope was introduced  through the anus and                            advanced to the the cecum, identified by                            appendiceal orifice and ileocecal valve. The                            colonoscopy was performed without difficulty. The                            patient tolerated the procedure well. The quality                            of the bowel preparation was excellent. The                            ileocecal valve, appendiceal orifice, and rectum                            were photographed. Scope In: 10:45:46 AM Scope Out: 10:59:04 AM Scope Withdrawal Time: 0 hours 8 minutes 58 seconds  Total Procedure Duration: 0 hours 13 minutes 18 seconds  Findings:                 The digital rectal exam was normal.                           Normal mucosa was found in the entire colon.                           Anal papilla(e) were hypertrophied, otherwise                            normal retroflexed view in the distal rectum. Complications:            No immediate complications. Estimated Blood Loss:     Estimated blood loss: none. Impression:               - Normal mucosa in the  entire examined colon.                           - Anal papilla(e) were hypertrophied.                           - No specimens collected. Recommendation:           - Patient has a contact number available for                            emergencies. The signs and symptoms of potential                            delayed complications were discussed with the                            patient. Return to normal activities tomorrow.                            Written discharge instructions were provided to the                            patient.                           - Resume previous diet.                           - Continue present medications.                           - Repeat colonoscopy in 5 years for screening                            purposes.                           - If anorectal prolapse  symptom with intermittent                            bleeding continues to be a problem, hemorrhoidal                            banding could be performed and would be expected to                            help alleviate these symptoms. Jerene Bears, MD 11/23/2016 11:04:12 AM This report has been signed electronically.

## 2016-11-23 NOTE — Progress Notes (Signed)
Report to PACU, RN, vss, BBS= Clear.  

## 2016-11-23 NOTE — Progress Notes (Signed)
Pt's states no medical or surgical changes since previsit or office visit. 

## 2016-11-23 NOTE — Patient Instructions (Signed)

## 2016-11-24 ENCOUNTER — Telehealth: Payer: Self-pay | Admitting: *Deleted

## 2016-11-24 NOTE — Telephone Encounter (Signed)
  Follow up Call-  Call back number 11/23/2016  Post procedure Call Back phone  # [phone]  Permission to leave phone message Yes  Some recent data might be hidden     Patient questions:  Do you have a fever, pain , or abdominal swelling? No. Pain Score  0 *  Have you tolerated food without any problems? Yes.    Have you been able to return to your normal activities? Yes.    Do you have any questions about your discharge instructions: Diet   No. Medications  No. Follow up visit  No.  Do you have questions or concerns about your Care? No.  Actions: * If pain score is 4 or above: No action needed, pain <4.

## 2017-02-02 ENCOUNTER — Institutional Professional Consult (permissible substitution): Payer: BC Managed Care – PPO | Admitting: Pulmonary Disease

## 2017-03-02 ENCOUNTER — Institutional Professional Consult (permissible substitution): Payer: BC Managed Care – PPO | Admitting: Pulmonary Disease

## 2017-03-24 ENCOUNTER — Ambulatory Visit: Payer: BC Managed Care – PPO | Admitting: Physician Assistant

## 2017-03-24 ENCOUNTER — Ambulatory Visit (INDEPENDENT_AMBULATORY_CARE_PROVIDER_SITE_OTHER): Payer: BC Managed Care – PPO

## 2017-03-24 ENCOUNTER — Encounter: Payer: Self-pay | Admitting: Physician Assistant

## 2017-03-24 VITALS — BP 129/79 | HR 92 | Temp 99.1°F | Resp 18 | Ht 66.5 in | Wt 229.4 lb

## 2017-03-24 DIAGNOSIS — J0140 Acute pansinusitis, unspecified: Secondary | ICD-10-CM | POA: Diagnosis not present

## 2017-03-24 DIAGNOSIS — R05 Cough: Secondary | ICD-10-CM

## 2017-03-24 DIAGNOSIS — R059 Cough, unspecified: Secondary | ICD-10-CM

## 2017-03-24 MED ORDER — HYDROCOD POLST-CPM POLST ER 10-8 MG/5ML PO SUER: 5.0000 mL | Freq: Every evening | ORAL | 0 refills | Status: DC | PRN

## 2017-03-24 MED ORDER — DOXYCYCLINE HYCLATE 100 MG PO CAPS: 100.0000 mg | ORAL_CAPSULE | Freq: Two times a day (BID) | ORAL | 0 refills | Status: AC

## 2017-03-24 MED ORDER — GUAIFENESIN ER 1200 MG PO TB12: 1.0000 | ORAL_TABLET | Freq: Two times a day (BID) | ORAL | 1 refills | Status: DC | PRN
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
## 2017-03-24 NOTE — Patient Instructions (Addendum)
Please drink water with 64 oz of water if not more.   You can take delsym or robitussin for your cough.  Cough, Adult A cough helps to clear your throat and lungs. A cough may last only 2-3 weeks (acute), or it may last longer than 8 weeks (chronic). Many different things can cause a cough. A cough may be a sign of an illness or another medical condition. Follow these instructions at home:  Pay attention to any changes in your cough.  Take medicines only as told by your doctor. ? If you were prescribed an antibiotic medicine, take it as told by your doctor. Do not stop taking it even if you start to feel better. ? Talk with your doctor before you try using a cough medicine.  Drink enough fluid to keep your pee (urine) clear or pale yellow.  If the air is dry, use a cold steam vaporizer or humidifier in your home.  Stay away from things that make you cough at work or at home.  If your cough is worse at night, try using extra pillows to raise your head up higher while you sleep.  Do not smoke, and try not to be around smoke. If you need help quitting, ask your doctor.  Do not have caffeine.  Do not drink alcohol.  Rest as needed. Contact a doctor if:  You have new problems (symptoms).  You cough up yellow fluid (pus).  Your cough does not get better after 2-3 weeks, or your cough gets worse.  Medicine does not help your cough and you are not sleeping well.  You have pain that gets worse or pain that is not helped with medicine.  You have a fever.  You are losing weight and you do not know why.  You have night sweats. Get help right away if:  You cough up blood.  You have trouble breathing.  Your heartbeat is very fast. This information is not intended to replace advice given to you by your health care provider. Make sure you discuss any questions you have with your health care provider. Document Released: 09/03/2010 Document Revised: 05/29/2015 Document Reviewed:  02/27/2014 Elsevier Interactive Patient Education  2018 [employer].   IF you received an x-ray today, you will receive an invoice from [facility]. Please contact [facility] at [phone] with questions or concerns regarding your invoice.   IF you received labwork today, you will receive an invoice from [facility]. Please contact [facility] at [phone] with questions or concerns regarding your invoice.   Our billing staff will not be able to assist you with questions regarding bills from these companies.  You will be contacted with the lab results as soon as they are available. The fastest way to get your results is to activate your My Chart account. Instructions are located on the last page of this paperwork. If you have not heard from [place] regarding the results in 2 weeks, please contact this office.

## 2017-03-25 ENCOUNTER — Encounter: Payer: Self-pay | Admitting: Physician Assistant

## 2017-04-12 ENCOUNTER — Encounter: Payer: Self-pay | Admitting: Family Medicine

## 2017-04-12 ENCOUNTER — Ambulatory Visit (INDEPENDENT_AMBULATORY_CARE_PROVIDER_SITE_OTHER)
Admission: RE | Admit: 2017-04-12 | Discharge: 2017-04-12 | Disposition: A | Discharge status: Home / Self Care | Payer: BC Managed Care – PPO | Source: Ambulatory Visit | Attending: Family Medicine | Admitting: Family Medicine

## 2017-04-12 ENCOUNTER — Other Ambulatory Visit: Payer: Self-pay | Admitting: Family Medicine

## 2017-04-12 ENCOUNTER — Ambulatory Visit: Payer: BC Managed Care – PPO | Admitting: Family Medicine

## 2017-04-12 VITALS — BP 132/72 | HR 64 | Temp 97.7°F | Ht 66.5 in | Wt 226.2 lb

## 2017-04-12 DIAGNOSIS — M25532 Pain in left wrist: Secondary | ICD-10-CM | POA: Insufficient documentation

## 2017-04-12 DIAGNOSIS — Z1231 Encounter for screening mammogram for malignant neoplasm of breast: Secondary | ICD-10-CM

## 2017-04-12 MED ORDER — MELOXICAM 15 MG PO TABS: 15.0000 mg | ORAL_TABLET | Freq: Every day | ORAL | 1 refills | Status: DC | PRN

## 2017-04-12 NOTE — Assessment & Plan Note (Signed)
After fall on outstretched hand yesterday  (hx of distal rad fx with ORIF in that arm in 2014) Dorsal tenderness and mild swelling on exam  Ice Gentle ace bandage  Xray now-pend rad rev meloxicam 15 mg daily with food  Further plan to follow
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
## 2017-04-12 NOTE — Patient Instructions (Signed)
Use ice for 10 minutes as often as you can   Take the meloxicam with food once daily for pain and inflammation   Guard your wrist and wear the ace bandage (not too tight)   Will update you with xray results later and make a plan

## 2017-04-13 ENCOUNTER — Encounter: Payer: Self-pay | Admitting: Physician Assistant

## 2017-05-06 ENCOUNTER — Telehealth: Payer: Self-pay | Admitting: Family Medicine

## 2017-05-06 NOTE — Telephone Encounter (Signed)
Copied from [place] [id number]. Topic: Quick Communication - Rx Refill/Question >> May 06, 2017  8:47 AM Arletha Grippe wrote: Medication: potassium  Has the patient contacted their pharmacy? Yes.   (Agent: If no, request that the patient contact the pharmacy for the refill.) Preferred Pharmacy (with phone number [facility] street [facility] [address]  Agent: Please be advised that RX refills may take up to 3 business days. We ask that you follow-up with your pharmacy.

## 2017-05-06 NOTE — Telephone Encounter (Signed)
Patient called, left VM to return the call to the office about her potassium refill request. Last filled 09/14/2016 30 tab/11 refills. [facility] called, spoke to [place], [employer]. I asked about the potassium prescription sent on 09/14/16, she says the patient still had refills on an old prescription and never used this one, so she will go ahead and fill it for her.

## 2017-05-16 ENCOUNTER — Ambulatory Visit
Admission: RE | Admit: 2017-05-16 | Discharge: 2017-05-16 | Disposition: A | Discharge status: Home / Self Care | Payer: BC Managed Care – PPO | Source: Ambulatory Visit | Attending: Family Medicine | Admitting: Family Medicine

## 2017-05-16 DIAGNOSIS — Z1231 Encounter for screening mammogram for malignant neoplasm of breast: Secondary | ICD-10-CM

## 2017-10-01 ENCOUNTER — Other Ambulatory Visit: Payer: Self-pay | Admitting: Family Medicine

## 2017-10-01 DIAGNOSIS — E876 Hypokalemia: Secondary | ICD-10-CM

## 2017-10-03 NOTE — Telephone Encounter (Signed)
Med filled once and Morey Hummingbird will reach out to the pt to schedule CPE

## 2017-10-03 NOTE — Telephone Encounter (Signed)
Please schedule a fall PE when able and refill until then

## 2017-10-03 NOTE — Telephone Encounter (Signed)
Pt only had an acute appt in April but no recent f/u or labs and no future appts., please advise

## 2017-10-05 ENCOUNTER — Telehealth: Payer: Self-pay | Admitting: Family Medicine

## 2017-10-05 DIAGNOSIS — I10 Essential (primary) hypertension: Secondary | ICD-10-CM

## 2017-10-05 DIAGNOSIS — R739 Hyperglycemia, unspecified: Secondary | ICD-10-CM

## 2017-10-05 DIAGNOSIS — E7800 Pure hypercholesterolemia, unspecified: Secondary | ICD-10-CM

## 2017-10-05 NOTE — Telephone Encounter (Signed)
-----   Message from [name] sent at 10/05/2017 11:55 AM EDT ----- Regarding: Lab orders for Thursday, 10.4.19 Patient is scheduled for CPX labs, please order future labs, Thanks , Karna Christmas

## 2017-10-06 ENCOUNTER — Other Ambulatory Visit (INDEPENDENT_AMBULATORY_CARE_PROVIDER_SITE_OTHER): Payer: BC Managed Care – PPO

## 2017-10-06 DIAGNOSIS — R739 Hyperglycemia, unspecified: Secondary | ICD-10-CM

## 2017-10-06 DIAGNOSIS — E7800 Pure hypercholesterolemia, unspecified: Secondary | ICD-10-CM

## 2017-10-06 DIAGNOSIS — I10 Essential (primary) hypertension: Secondary | ICD-10-CM | POA: Diagnosis not present

## 2017-10-06 LAB — CBC WITH DIFFERENTIAL/PLATELET
Basophils Absolute: 0 10*3/uL (ref 0.0–0.1)
Basophils Relative: 0.9 % (ref 0.0–3.0)
Eosinophils Absolute: 0.1 10*3/uL (ref 0.0–0.7)
Eosinophils Relative: 3.5 % (ref 0.0–5.0)
HCT: 39.6 % (ref 36.0–46.0)
Hemoglobin: 13.3 g/dL (ref 12.0–15.0)
Lymphocytes Relative: 43.2 % (ref 12.0–46.0)
Lymphs Abs: 1.5 10*3/uL (ref 0.7–4.0)
MCHC: 33.6 g/dL (ref 30.0–36.0)
MCV: 91.3 fl (ref 78.0–100.0)
Monocytes Absolute: 0.4 10*3/uL (ref 0.1–1.0)
Monocytes Relative: 10.8 % (ref 3.0–12.0)
Neutro Abs: 1.5 10*3/uL (ref 1.4–7.7)
Neutrophils Relative %: 41.6 % — ABNORMAL LOW (ref 43.0–77.0)
Platelets: 277 10*3/uL (ref 150.0–400.0)
RBC: 4.34 Mil/uL (ref 3.87–5.11)
RDW: 13.3 % (ref 11.5–15.5)
WBC: 3.5 10*3/uL — ABNORMAL LOW (ref 4.0–10.5)

## 2017-10-06 LAB — COMPREHENSIVE METABOLIC PANEL
ALT: 19 U/L (ref 0–35)
AST: 22 U/L (ref 0–37)
Albumin: 4.2 g/dL (ref 3.5–5.2)
Alkaline Phosphatase: 44 U/L (ref 39–117)
BUN: 17 mg/dL (ref 6–23)
CO2: 32 mEq/L (ref 19–32)
Calcium: 9.2 mg/dL (ref 8.4–10.5)
Chloride: 102 mEq/L (ref 96–112)
Creatinine, Ser: 0.73 mg/dL (ref 0.40–1.20)
GFR: 103.08 mL/min (ref >60.00)
Glucose, Bld: 109 mg/dL — ABNORMAL HIGH (ref 70–99)
Potassium: 3.6 mEq/L (ref 3.5–5.1)
Sodium: 138 mEq/L (ref 135–145)
Total Bilirubin: 0.5 mg/dL (ref 0.2–1.2)
Total Protein: 7.1 g/dL (ref 6.0–8.3)

## 2017-10-06 LAB — LIPID PANEL
Cholesterol: 197 mg/dL (ref 0–200)
HDL: 55.8 mg/dL (ref >39.00)
LDL Cholesterol: 126 mg/dL — ABNORMAL HIGH (ref 0–99)
NonHDL: 140.84
Total CHOL/HDL Ratio: 4
Triglycerides: 74 mg/dL (ref 0.0–149.0)
VLDL: 14.8 mg/dL (ref 0.0–40.0)

## 2017-10-06 LAB — TSH: TSH: 1.82 u[IU]/mL (ref 0.35–4.50)

## 2017-10-06 LAB — HEMOGLOBIN A1C: Hgb A1c MFr Bld: 6.3 % (ref 4.6–6.5)

## 2017-10-20 ENCOUNTER — Encounter: Payer: BC Managed Care – PPO | Admitting: Family Medicine

## 2017-10-24 ENCOUNTER — Other Ambulatory Visit: Payer: Self-pay | Admitting: *Deleted

## 2017-10-24 MED ORDER — HYDROCHLOROTHIAZIDE 50 MG PO TABS: 50.0000 mg | ORAL_TABLET | Freq: Every day | ORAL | 5 refills | Status: DC

## 2017-11-07 ENCOUNTER — Ambulatory Visit: Payer: Self-pay

## 2017-11-07 ENCOUNTER — Encounter: Payer: BC Managed Care – PPO | Admitting: Family Medicine

## 2017-11-07 ENCOUNTER — Other Ambulatory Visit: Payer: Self-pay | Admitting: Family Medicine

## 2017-11-07 DIAGNOSIS — M25532 Pain in left wrist: Secondary | ICD-10-CM

## 2017-11-07 DIAGNOSIS — Z0289 Encounter for other administrative examinations: Secondary | ICD-10-CM

## 2017-11-10 ENCOUNTER — Other Ambulatory Visit: Payer: Self-pay | Admitting: *Deleted

## 2017-11-14 ENCOUNTER — Other Ambulatory Visit: Payer: Self-pay | Admitting: *Deleted

## 2017-11-14 MED ORDER — AMLODIPINE BESY-BENAZEPRIL HCL 5-20 MG PO CAPS: 1.0000 | ORAL_CAPSULE | Freq: Every day | ORAL | 0 refills | Status: DC

## 2017-11-25 ENCOUNTER — Encounter: Payer: Self-pay | Admitting: Family Medicine

## 2017-11-25 ENCOUNTER — Ambulatory Visit (INDEPENDENT_AMBULATORY_CARE_PROVIDER_SITE_OTHER): Payer: BC Managed Care – PPO | Admitting: Family Medicine

## 2017-11-25 VITALS — BP 130/78 | HR 62 | Temp 97.8°F | Ht 66.25 in | Wt 216.8 lb

## 2017-11-25 DIAGNOSIS — Z0000 Encounter for general adult medical examination without abnormal findings: Secondary | ICD-10-CM | POA: Diagnosis not present

## 2017-11-25 DIAGNOSIS — E6609 Other obesity due to excess calories: Secondary | ICD-10-CM

## 2017-11-25 DIAGNOSIS — I10 Essential (primary) hypertension: Secondary | ICD-10-CM

## 2017-11-25 DIAGNOSIS — E876 Hypokalemia: Secondary | ICD-10-CM

## 2017-11-25 DIAGNOSIS — R7303 Prediabetes: Secondary | ICD-10-CM

## 2017-11-25 DIAGNOSIS — Z6835 Body mass index (BMI) 35.0-35.9, adult: Secondary | ICD-10-CM

## 2017-11-25 DIAGNOSIS — Z23 Encounter for immunization: Secondary | ICD-10-CM

## 2017-11-25 DIAGNOSIS — Z6834 Body mass index (BMI) 34.0-34.9, adult: Secondary | ICD-10-CM

## 2017-11-25 DIAGNOSIS — E7800 Pure hypercholesterolemia, unspecified: Secondary | ICD-10-CM

## 2017-11-25 MED ORDER — AMLODIPINE BESY-BENAZEPRIL HCL 5-20 MG PO CAPS: 1.0000 | ORAL_CAPSULE | Freq: Every day | ORAL | 3 refills | Status: DC

## 2017-11-25 MED ORDER — POTASSIUM CHLORIDE ER 10 MEQ PO TBCR: 10.0000 meq | EXTENDED_RELEASE_TABLET | Freq: Every day | ORAL | 3 refills | Status: DC

## 2017-11-25 MED ORDER — FLUTICASONE PROPIONATE 50 MCG/ACT NA SUSP: 2.0000 | Freq: Every day | NASAL | 3 refills | Status: DC | PRN

## 2017-11-25 MED ORDER — HYDROCHLOROTHIAZIDE 50 MG PO TABS
50.0000 mg | ORAL_TABLET | Freq: Every day | ORAL | 3 refills | Status: DC
Start: 2017-11-25 — End: 2018-11-27

## 2017-11-25 NOTE — Patient Instructions (Addendum)
If you are interested in the new shingles vaccine (Shingrix) - call your local pharmacy to check on coverage and availability  If affordable, get on a wait list at your pharmacy to get the vaccine.   You are borderline diabetes - closer to diabetes Save sweets for special occasions  Try to get most of your carbohydrates from produce (with the exception of white potatoes)  Eat less bread/pasta/rice/snack foods/cereals/sweets and other items from the middle of the grocery store (processed carbs)  Also exercise as much as you can and keep loosing weight   For cholesterol Avoid red meat/ fried foods/ egg yolks/ fatty breakfast meats/ butter, cheese and high fat dairy/ and shellfish   You are in the range for medication if you want to try cholesterol medication     Try to get 1200-1500 mg of calcium per day with at least 1000 iu of vitamin D - for bone health
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
## 2017-11-27 NOTE — Assessment & Plan Note (Signed)
Discussed how this problem influences overall health and the risks it imposes  Reviewed plan for weight loss with lower calorie diet (via better food choices and also portion control or program like weight watchers) and exercise building up to or more than 30 minutes 5 days per week including some aerobic activity    

## 2017-11-27 NOTE — Assessment & Plan Note (Signed)
Reviewed health habits including diet and exercise and skin cancer prevention Reviewed appropriate screening tests for age  Also reviewed health mt list, fam hx and immunization status , as well as social and family history   See HPI Labs reviewed Commended wt loss so far  Flu vaccine today  Long disc re: diet change for cholesterol and prediabetes Also ca and D for bone health  Also self care in setting of stressors

## 2017-11-27 NOTE — Assessment & Plan Note (Signed)
Lab Results  Component Value Date   HGBA1C 6.3 10/06/2017   disc imp of low glycemic diet and wt loss to prevent DM2  Handouts given-prediabetes

## 2017-11-27 NOTE — Assessment & Plan Note (Signed)
Disc goals for lipids and reasons to control them Rev last labs with pt Rev low sat fat diet in detail  LDL is improved at 126 Disc guidelines for tx/pt declines medication but wants to keep working on diet/lifestyle change

## 2017-11-27 NOTE — Assessment & Plan Note (Signed)
bp in fair control at this time  BP Readings from Last 1 Encounters:  11/25/17 130/78   No changes needed Most recent labs reviewed  Disc lifstyle change with low sodium diet and exercise

## 2018-03-10 ENCOUNTER — Encounter: Payer: Self-pay | Admitting: Family Medicine

## 2018-03-10 ENCOUNTER — Ambulatory Visit: Payer: BC Managed Care – PPO | Admitting: Family Medicine

## 2018-03-10 VITALS — BP 122/78 | HR 64 | Temp 98.0°F | Ht 66.25 in | Wt 215.6 lb

## 2018-03-10 DIAGNOSIS — J069 Acute upper respiratory infection, unspecified: Secondary | ICD-10-CM

## 2018-03-10 DIAGNOSIS — B9789 Other viral agents as the cause of diseases classified elsewhere: Secondary | ICD-10-CM

## 2018-03-10 DIAGNOSIS — Z6835 Body mass index (BMI) 35.0-35.9, adult: Secondary | ICD-10-CM | POA: Diagnosis not present

## 2018-03-10 NOTE — Patient Instructions (Signed)
Drink lots of water  Rest when you can  I think you are getting a cold and this has caused ear congestion  flonase helps Start using it 2 sprays in each nostril twice daily for a week Then once daily   The sinus pill is ok if you don't have any side effects   For cough - I like mucinex DM (make sure it does have the same ingredients as the sinus pill)   Update if not starting to improve in a week or if worsening
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
## 2018-03-10 NOTE — Assessment & Plan Note (Addendum)
With ETD Disc symptom control  Will start back on flonase bid for a week then qd Sinus med otc or mucinex dm Fluids/rest Update if not starting to improve in a week or if worsening  -esp if ear pain  AVS: Drink lots of water  Rest when you can  I think you are getting a cold and this has caused ear congestion  flonase helps Start using it 2 sprays in each nostril twice daily for a week Then once daily   The sinus pill is ok if you don't have any side effects   For cough - I like mucinex DM (make sure it does have the same ingredients as the sinus pill)   Update if not starting to improve in a week or if worsening

## 2018-03-12 NOTE — Assessment & Plan Note (Signed)
Enc pt to keep loosing weight

## 2018-06-07 ENCOUNTER — Other Ambulatory Visit: Payer: Self-pay | Admitting: Family Medicine

## 2018-06-07 DIAGNOSIS — Z1231 Encounter for screening mammogram for malignant neoplasm of breast: Secondary | ICD-10-CM

## 2018-07-25 ENCOUNTER — Ambulatory Visit
Admission: RE | Admit: 2018-07-25 | Discharge: 2018-07-25 | Disposition: A | Discharge status: Home / Self Care | Payer: Medicare Other | Source: Ambulatory Visit | Attending: Family Medicine | Admitting: Family Medicine

## 2018-07-25 ENCOUNTER — Other Ambulatory Visit: Payer: Self-pay

## 2018-07-25 DIAGNOSIS — Z1231 Encounter for screening mammogram for malignant neoplasm of breast: Secondary | ICD-10-CM

## 2018-10-10 ENCOUNTER — Ambulatory Visit (INDEPENDENT_AMBULATORY_CARE_PROVIDER_SITE_OTHER): Payer: Medicare Other

## 2018-10-10 DIAGNOSIS — Z23 Encounter for immunization: Secondary | ICD-10-CM

## 2018-11-16 ENCOUNTER — Telehealth: Payer: Self-pay | Admitting: Family Medicine

## 2018-11-16 DIAGNOSIS — E7800 Pure hypercholesterolemia, unspecified: Secondary | ICD-10-CM

## 2018-11-16 DIAGNOSIS — R7303 Prediabetes: Secondary | ICD-10-CM

## 2018-11-16 DIAGNOSIS — I10 Essential (primary) hypertension: Secondary | ICD-10-CM

## 2018-11-16 NOTE — Telephone Encounter (Signed)
-----   Message from [name] sent at 11/13/2018  4:05 PM EST ----- Regarding: Lab orders for Monday, 11.16.20 Patient is scheduled for CPX labs, please order future labs, Thanks , Karna Christmas

## 2018-11-20 ENCOUNTER — Other Ambulatory Visit (INDEPENDENT_AMBULATORY_CARE_PROVIDER_SITE_OTHER): Payer: Medicare Other

## 2018-11-20 ENCOUNTER — Other Ambulatory Visit: Payer: Self-pay

## 2018-11-20 DIAGNOSIS — E7800 Pure hypercholesterolemia, unspecified: Secondary | ICD-10-CM

## 2018-11-20 DIAGNOSIS — I10 Essential (primary) hypertension: Secondary | ICD-10-CM | POA: Diagnosis not present

## 2018-11-20 DIAGNOSIS — R7303 Prediabetes: Secondary | ICD-10-CM | POA: Diagnosis not present

## 2018-11-20 LAB — CBC WITH DIFFERENTIAL/PLATELET
Basophils Absolute: 0 10*3/uL (ref 0.0–0.1)
Basophils Relative: 1.1 % (ref 0.0–3.0)
Eosinophils Absolute: 0.1 10*3/uL (ref 0.0–0.7)
Eosinophils Relative: 2.9 % (ref 0.0–5.0)
HCT: 39.2 % (ref 36.0–46.0)
Hemoglobin: 13.4 g/dL (ref 12.0–15.0)
Lymphocytes Relative: 43.3 % (ref 12.0–46.0)
Lymphs Abs: 1.7 10*3/uL (ref 0.7–4.0)
MCHC: 34.1 g/dL (ref 30.0–36.0)
MCV: 91.9 fl (ref 78.0–100.0)
Monocytes Absolute: 0.4 10*3/uL (ref 0.1–1.0)
Monocytes Relative: 10.1 % (ref 3.0–12.0)
Neutro Abs: 1.6 10*3/uL (ref 1.4–7.7)
Neutrophils Relative %: 42.6 % — ABNORMAL LOW (ref 43.0–77.0)
Platelets: 295 10*3/uL (ref 150.0–400.0)
RBC: 4.26 Mil/uL (ref 3.87–5.11)
RDW: 12.9 % (ref 11.5–15.5)
WBC: 3.8 10*3/uL — ABNORMAL LOW (ref 4.0–10.5)

## 2018-11-20 LAB — COMPREHENSIVE METABOLIC PANEL
ALT: 16 U/L (ref 0–35)
AST: 15 U/L (ref 0–37)
Albumin: 4.2 g/dL (ref 3.5–5.2)
Alkaline Phosphatase: 55 U/L (ref 39–117)
BUN: 17 mg/dL (ref 6–23)
CO2: 28 mEq/L (ref 19–32)
Calcium: 9.2 mg/dL (ref 8.4–10.5)
Chloride: 102 mEq/L (ref 96–112)
Creatinine, Ser: 0.76 mg/dL (ref 0.40–1.20)
GFR: 92.25 mL/min (ref >60.00)
Glucose, Bld: 115 mg/dL — ABNORMAL HIGH (ref 70–99)
Potassium: 3.6 mEq/L (ref 3.5–5.1)
Sodium: 139 mEq/L (ref 135–145)
Total Bilirubin: 0.4 mg/dL (ref 0.2–1.2)
Total Protein: 7.2 g/dL (ref 6.0–8.3)

## 2018-11-20 LAB — TSH: TSH: 2.17 u[IU]/mL (ref 0.35–4.50)

## 2018-11-20 LAB — LIPID PANEL
Cholesterol: 207 mg/dL — ABNORMAL HIGH (ref 0–200)
HDL: 54.6 mg/dL (ref >39.00)
LDL Cholesterol: 127 mg/dL — ABNORMAL HIGH (ref 0–99)
NonHDL: 152.08
Total CHOL/HDL Ratio: 4
Triglycerides: 126 mg/dL (ref 0.0–149.0)
VLDL: 25.2 mg/dL (ref 0.0–40.0)

## 2018-11-20 LAB — HEMOGLOBIN A1C: Hgb A1c MFr Bld: 6.1 % (ref 4.6–6.5)

## 2018-11-27 ENCOUNTER — Encounter: Payer: Self-pay | Admitting: Family Medicine

## 2018-11-27 ENCOUNTER — Ambulatory Visit (INDEPENDENT_AMBULATORY_CARE_PROVIDER_SITE_OTHER): Payer: Medicare Other | Admitting: Family Medicine

## 2018-11-27 ENCOUNTER — Other Ambulatory Visit: Payer: Self-pay

## 2018-11-27 VITALS — BP 130/79 | HR 66 | Temp 97.0°F | Ht 66.25 in | Wt 232.1 lb

## 2018-11-27 DIAGNOSIS — Z0000 Encounter for general adult medical examination without abnormal findings: Secondary | ICD-10-CM | POA: Insufficient documentation

## 2018-11-27 DIAGNOSIS — E876 Hypokalemia: Secondary | ICD-10-CM

## 2018-11-27 DIAGNOSIS — Z6835 Body mass index (BMI) 35.0-35.9, adult: Secondary | ICD-10-CM

## 2018-11-27 DIAGNOSIS — E7800 Pure hypercholesterolemia, unspecified: Secondary | ICD-10-CM

## 2018-11-27 DIAGNOSIS — Z23 Encounter for immunization: Secondary | ICD-10-CM

## 2018-11-27 DIAGNOSIS — I10 Essential (primary) hypertension: Secondary | ICD-10-CM

## 2018-11-27 DIAGNOSIS — E2839 Other primary ovarian failure: Secondary | ICD-10-CM | POA: Insufficient documentation

## 2018-11-27 MED ORDER — AMLODIPINE BESY-BENAZEPRIL HCL 5-20 MG PO CAPS: 1.0000 | ORAL_CAPSULE | Freq: Every day | ORAL | 3 refills | Status: DC

## 2018-11-27 MED ORDER — POTASSIUM CHLORIDE ER 10 MEQ PO TBCR: 10.0000 meq | EXTENDED_RELEASE_TABLET | Freq: Every day | ORAL | 3 refills | Status: DC

## 2018-11-27 MED ORDER — HYDROCHLOROTHIAZIDE 50 MG PO TABS: 50.0000 mg | ORAL_TABLET | Freq: Every day | ORAL | 3 refills | Status: DC

## 2018-11-27 NOTE — Assessment & Plan Note (Signed)
Disc goals for lipids and reasons to control them Rev last labs with pt Rev low sat fat diet in detail LDL 127 -stable from last check  Goal less than 100  Pt wants to work on diet for this-info given

## 2018-11-27 NOTE — Assessment & Plan Note (Signed)
Reviewed health habits including diet and exercise and skin cancer prevention Reviewed appropriate screening tests for age  Also reviewed health mt list, fam hx and immunization status , as well as social and family history   See HPI Labs reviewed prevnar vaccine given  dexa ordered , no falls or fx/ plans to start ca and D Materials given to work on advance directive  No cognitive concerns  Nl hearing screen  Vision screen 20/40 both eyes-may need new glasses px/ she plans to f/u with her optometrist after jan 1

## 2018-11-27 NOTE — Assessment & Plan Note (Signed)
Discussed how this problem influences overall health and the risks it imposes  Reviewed plan for weight loss with lower calorie diet (via better food choices and also portion control or program like weight watchers) and exercise building up to or more than 30 minutes 5 days per week including some aerobic activity    

## 2018-11-27 NOTE — Assessment & Plan Note (Signed)
bp in fair control at this time  BP Readings from Last 1 Encounters:  11/27/18 130/79   No changes needed Most recent labs reviewed  Disc lifstyle change with low sodium diet and exercise

## 2018-11-27 NOTE — Assessment & Plan Note (Signed)
Refer for dexa Remote h/o radius fx Disc imp of ca and D and exercise  No recent falls or fx

## 2018-11-27 NOTE — Progress Notes (Signed)
Subjective:    Patient ID: Rebecca [name], female    DOB: 12/29/1955, 63 y.o.   MRN: [id number] This visit occurred during the SARS-CoV-2 public health emergency.  Safety protocols were in place, including screening questions prior to the visit, additional usage of staff PPE, and extensive cleaning of exam room while observing appropriate contact time as indicated for disinfecting solutions.    HPI Here for welcome to medicare preventative visit   I have personally reviewed the Medicare Annual Wellness questionnaire and have noted 1. The patient's medical and social history 2. Their use of alcohol, tobacco or illicit drugs 3. Their current medications and supplements 4. The patient's functional ability including ADL's, fall risks, home safety risks and hearing or visual             impairment. 5. Diet and physical activities 6. Evidence for depression or mood disorders  The patients weight, height, BMI have been recorded in the chart and visual acuity is per eye clinic.  I have made referrals, counseling and provided education to the patient based review of the above and I have provided the pt with a written personalized care plan for preventive services. Reviewed and updated provider list, see scanned forms.  See scanned forms.  Routine anticipatory guidance given to patient.  See health maintenance. Colon cancer screening   Colonoscopy 11/18 with 5 y recall Breast cancer screening  Mammogram 7/20  Self breast exam-no lumps  Has had a hysterectomy - no gyn problems or symptoms  Flu vaccine 10/20  Tetanus vaccine   Tdap 11/14  Pneumovax-due for prevnar  Zoster vaccine-would like to get shingrix  Dexa -would like to do in [facility]- none since she retired  Fractures- none  Supplements  mvi  Exercise -walking   Advance directive-she has not done yet - given materials to work on  Cognitive function addressed- see scanned forms- and if abnormal then additional documentation  follows.  She has no concerns about her memory occ misplaces things-no more than that    PMH and SH reviewed  Meds, vitals, and allergies reviewed.   ROS: See HPI.  Otherwise negative.    Weight : Wt Readings from Last 3 Encounters:  11/27/18 232 lb 1 oz (105.3 kg)  03/10/18 215 lb 9 oz (97.8 kg)  11/25/17 216 lb 12 oz (98.3 kg)  gained wt with the pandemic / no time for self care  Some walking for exercise- was 3 d per week (not in the past few wk)  Has some exercise equip at home as well  Not eating healthy - she is starting to re assess this once she gained weight  37.17 kg/m    Hearing/vision:  Hearing Screening   125Hz  250Hz  500Hz  1000Hz  2000Hz  3000Hz  4000Hz  6000Hz  8000Hz   Right ear:  40 40 40 40  40    Left ear:  40 40 40 40  40      Visual Acuity Screening   Right eye Left eye Both eyes  Without correction:     With correction: 20/30 20/30 20/40   was wearing her glasses  Is due for an eye/vision exam- can go in January    Hypertension  bp is stable today -- 125/75 at home earlier today  No cp or palpitations or headaches or edema  No side effects to medicines  BP Readings from Last 3 Encounters:  11/27/18 (!) 142/68  03/10/18 122/78  11/25/17 130/78      Stressed:  Caring for  parents with dementia  She is torn many different directions  Has to care for them 3 nights per week  Not getting much sleep at all    Hyperlipidemia  Lab Results  Component Value Date   CHOL 207 (H) 11/20/2018   CHOL 197 10/06/2017   CHOL 205 (H) 09/09/2016   Lab Results  Component Value Date   HDL 54.60 11/20/2018   HDL 55.80 10/06/2017   HDL 52.50 09/09/2016   Lab Results  Component Value Date   LDLCALC 127 (H) 11/20/2018   LDLCALC 126 (H) 10/06/2017   LDLCALC 135 (H) 09/09/2016   Lab Results  Component Value Date   TRIG 126.0 11/20/2018   TRIG 74.0 10/06/2017   TRIG 88.0 09/09/2016   Lab Results  Component Value Date   CHOLHDL 4 11/20/2018   CHOLHDL 4  10/06/2017   CHOLHDL 4 09/09/2016   Lab Results  Component Value Date   LDLDIRECT 107.3 08/11/2009    Prediabetes Lab Results  Component Value Date   HGBA1C 6.1 11/20/2018  down from 6.3 last time   Patient Active Problem List   Diagnosis Date Noted  . Medicare welcome exam 11/27/2018  . Estrogen deficiency 11/27/2018  . Encounter for pre-employment examination 11/02/2016  . Fungal nail infection 04/27/2016  . Sinusitis, chronic 03/12/2015  . Hyperlipidemia 10/30/2013  . History of radius fracture 11/23/2012  . Undiagnosed cardiac murmurs 11/23/2012  . Class 2 severe obesity due to excess calories with serious comorbidity and body mass index (BMI) of 35.0 to 35.9 in adult ([place]) 09/14/2011  . Family history of colon cancer requiring screening colonoscopy 09/14/2011  . Routine general medical examination at a health care facility 09/05/2011  . Mixed incontinence urge and stress 04/14/2011  . Hemorrhoids, internal, with bleeding 10/12/2010  . External hemorrhoids 11/04/2009  . ALLERGIC RHINITIS 08/13/2009  . HYPOKALEMIA 04/28/2007  . Prediabetes 11/15/2006  . 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  . BUNIONS, RIGHT FOOT 07/19/2006  . 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 Essential hypertension 07/06/2006  . URINARY INCONTINENCE, MIXED 07/06/2006   Past Medical History:  Diagnosis Date  . Allergy   . Cataract    beginnings of   . Glaucoma   . Heart murmur    noted on PCP exam 11/23/2012; "does not appear urgent"  . Hemorrhoids   . Hypertension    under control with meds., has been on med. > 20 yr.  . Radius fracture 11/22/2012   left, comminuted   Past Surgical History:  Procedure Laterality Date  . ABDOMINAL HYSTERECTOMY  06/02/2000   partial  . COLONOSCOPY  03/2006  . COLONOSCOPY WITH PROPOFOL  11/15/2011  . ENDOMETRIAL ABLATION    . OPEN REDUCTION INTERNAL FIXATION (ORIF) DISTAL RADIAL FRACTURE Left 11/29/2012   Procedure: OPEN REDUCTION INTERNAL FIXATION (ORIF) LEFT  DISTAL RADIAL  FRACTURE;  Surgeon: Wynonia Sours, MD;  Location: [facility];  Service: Orthopedics;  Laterality: Left;  . PARTIAL HYSTERECTOMY     Social History   Tobacco Use  . Smoking status: Never Smoker  . Smokeless tobacco: Never Used  Substance Use Topics  . Alcohol use: No  . Drug use: No   Family History  Problem Relation Age of Onset  . Hypertension Father   . Coronary artery disease Father        with CABG  . Hypertension Mother   . Colon cancer Mother   . Diabetes Mother   . Breast cancer Maternal Aunt   . Diabetes Maternal Aunt   .  Colon cancer Maternal Uncle   . Colon cancer Paternal Uncle   . Colon polyps Sister   . Esophageal cancer Neg Hx   . Rectal cancer Neg Hx   . Stomach cancer Neg Hx    Allergies  Allergen Reactions  . Amoxicillin Hives and Swelling  . Spironolactone Diarrhea  . Augmentin [Amoxicillin-Pot Clavulanate] Diarrhea    Tore stomach up   Current Outpatient Medications on File Prior to Visit  Medication Sig Dispense Refill  . fluticasone (FLONASE) 50 MCG/ACT nasal spray Place 2 sprays into both nostrils daily as needed for allergies or rhinitis. 48 g 3  . Guaifenesin (MUCINEX MAXIMUM STRENGTH) 1200 MG TB12 Take 1 tablet (1,200 mg total) by mouth every 12 (twelve) hours as needed. 14 tablet 1  . hydrocortisone (ANUCORT-HC) 25 MG suppository UNWRAP AND INSERT ONE SUPPOSITORY RECTALLY EVERY NIGHT AT BEDTIME AS NEEDED FOR HEMORRHOIDS 12 suppository 0  . hydrocortisone (ANUSOL-HC) 2.5 % rectal cream Apply to affected area on hemorrhoids once daily as needed 30 g 0  . hyoscyamine (LEVSIN SL) 0.125 MG SL tablet Place 1 tablet (0.125 mg total) under the tongue every 4 (four) hours as needed (rectal spasm). 30 tablet 1  . latanoprost (XALATAN) 0.005 % ophthalmic solution Place 1 drop into both eyes.     . meloxicam (MOBIC) 15 MG tablet Take 1 tablet (15 mg total) by mouth daily as needed for pain. With food for wrist and hand pain 30 tablet 1  .  Multiple Vitamin (MULTIVITAMIN) tablet Take 1 tablet by mouth daily.      . Omega-3 Fatty Acids (FISH OIL CONCENTRATE PO) Take 1 capsule by mouth daily.      No current facility-administered medications on file prior to visit.     Review of Systems  Constitutional: Positive for fatigue. Negative for activity change, appetite change, fever and unexpected weight change.  HENT: Negative for congestion, ear pain, rhinorrhea, sinus pressure and sore throat.   Eyes: Negative for pain, redness and visual disturbance.  Respiratory: Negative for cough, shortness of breath and wheezing.   Cardiovascular: Negative for chest pain and palpitations.  Gastrointestinal: Negative for abdominal pain, blood in stool, constipation and diarrhea.  Endocrine: Negative for polydipsia and polyuria.  Genitourinary: Negative for dysuria, frequency and urgency.  Musculoskeletal: Negative for arthralgias, back pain and myalgias.  Skin: Negative for pallor and rash.  Allergic/Immunologic: Negative for environmental allergies.  Neurological: Negative for dizziness, syncope and headaches.  Hematological: Negative for adenopathy. Does not bruise/bleed easily.  Psychiatric/Behavioral: Negative for decreased concentration and dysphoric mood. The patient is not nervous/anxious.        Stressors-caring for elderly   All other systems reviewed and are negative.      Objective:   Physical Exam Constitutional:      General: She is not in acute distress.    Appearance: Normal appearance. She is well-developed. She is obese. She is not ill-appearing or diaphoretic.  HENT:     Head: Normocephalic and atraumatic.     Right Ear: Tympanic membrane, ear canal and external ear normal.     Left Ear: Tympanic membrane, ear canal and external ear normal.     Nose: Nose normal. No congestion.     Mouth/Throat:     Mouth: Mucous membranes are moist.     Pharynx: Oropharynx is clear. No posterior oropharyngeal erythema.  Eyes:      General: No scleral icterus.    Extraocular Movements: Extraocular movements intact.  Conjunctiva/sclera: Conjunctivae normal.     Pupils: Pupils are equal, round, and reactive to light.  Neck:     Musculoskeletal: Normal range of motion and neck supple. No neck rigidity or muscular tenderness.     Thyroid: No thyromegaly.     Vascular: No carotid bruit or JVD.  Cardiovascular:     Rate and Rhythm: Normal rate and regular rhythm.     Pulses: Normal pulses.     Heart sounds: Normal heart sounds. No gallop.   Pulmonary:     Effort: Pulmonary effort is normal. No respiratory distress.     Breath sounds: Normal breath sounds. No wheezing.     Comments: Good air exch Chest:     Chest wall: No tenderness.  Abdominal:     General: Bowel sounds are normal. There is no distension or abdominal bruit.     Palpations: Abdomen is soft. There is no mass.     Tenderness: There is no abdominal tenderness.     Hernia: No hernia is present.  Genitourinary:    Comments: Breast exam: No mass, nodules, thickening, tenderness, bulging, retraction, inflamation, nipple discharge or skin changes noted.  No axillary or clavicular LA.     Musculoskeletal: Normal range of motion.        General: No tenderness.     Right lower leg: No edema.     Left lower leg: No edema.     Comments: No kyphosis   Lymphadenopathy:     Cervical: No cervical adenopathy.  Skin:    General: Skin is warm and dry.     Coloration: Skin is not pale.     Findings: No erythema or rash.     Comments: Few skin tags   Neurological:     Mental Status: She is alert. Mental status is at baseline.     Cranial Nerves: No cranial nerve deficit.     Motor: No abnormal muscle tone.     Coordination: Coordination normal.     Gait: Gait normal.     Deep Tendon Reflexes: Reflexes are normal and symmetric. Reflexes normal.  Psychiatric:        Mood and Affect: Mood normal.        Cognition and Memory: Cognition and memory normal.            Assessment & Plan:   Problem List Items Addressed This Visit      Cardiovascular and Mediastinum   Essential hypertension    bp in fair control at this time  BP Readings from Last 1 Encounters:  11/27/18 130/79   No changes needed Most recent labs reviewed  Disc lifstyle change with low sodium diet and exercise        Relevant Medications   amLODipine-benazepril (LOTREL) 5-20 MG capsule   hydrochlorothiazide (HYDRODIURIL) 50 MG tablet     Other   Class 2 severe obesity due to excess calories with serious comorbidity and body mass index (BMI) of 35.0 to 35.9 in adult [facility])    Discussed how this problem influences overall health and the risks it imposes  Reviewed plan for weight loss with lower calorie diet (via better food choices and also portion control or program like weight watchers) and exercise building up to or more than 30 minutes 5 days per week including some aerobic activity         Hyperlipidemia    Disc goals for lipids and reasons to control them Rev last labs with pt Rev low sat  fat diet in detail LDL 127 -stable from last check  Goal less than 100  Pt wants to work on diet for this-info given      Relevant Medications   amLODipine-benazepril (LOTREL) 5-20 MG capsule   hydrochlorothiazide (HYDRODIURIL) 50 MG tablet   Medicare welcome exam - Primary    Reviewed health habits including diet and exercise and skin cancer prevention Reviewed appropriate screening tests for age  Also reviewed health mt list, fam hx and immunization status , as well as social and family history   See HPI Labs reviewed prevnar vaccine given  dexa ordered , no falls or fx/ plans to start ca and D Materials given to work on advance directive  No cognitive concerns  Nl hearing screen  Vision screen 20/40 both eyes-may need new glasses px/ she plans to f/u with her optometrist after jan 1          Estrogen deficiency    Refer for dexa Remote h/o radius fx Disc  imp of ca and D and exercise  No recent falls or fx       Relevant Orders   DG Bone Density    Other Visit Diagnoses    Hypokalemia       Relevant Medications   potassium chloride (KLOR-CON) 10 MEQ tablet   Need for vaccination with 13-polyvalent pneumococcal conjugate vaccine       Relevant Orders   Pneumococcal conjugate vaccine 13-valent (Completed)

## 2018-11-27 NOTE — Patient Instructions (Addendum)
Pneumonia vaccine (prevnar) today  If you are interested in the new shingles vaccine (Shingrix) - call your local pharmacy to check on coverage and availability  If affordable, get on a wait list at your pharmacy to get the vaccine.   Try to get 1200-1500 mg of calcium per day with at least 1000 iu of vitamin D - for bone health   Complete the blue packet for advance directive  Bring [place] a copy and we will scan into chart   For cholesterol  Avoid red meat/ fried foods/ egg yolks/ fatty breakfast meats/ butter, cheese and high fat dairy/ and shellfish    Stop at check out to schedule your bone density test   Get your eye exam

## 2018-12-20 ENCOUNTER — Ambulatory Visit
Admission: RE | Admit: 2018-12-20 | Discharge: 2018-12-20 | Disposition: A | Discharge status: Home / Self Care | Payer: Medicare Other | Source: Ambulatory Visit | Attending: Family Medicine | Admitting: Family Medicine

## 2018-12-20 DIAGNOSIS — E2839 Other primary ovarian failure: Secondary | ICD-10-CM | POA: Diagnosis present

## 2019-01-29 ENCOUNTER — Telehealth: Payer: Self-pay | Admitting: *Deleted

## 2019-01-29 NOTE — Telephone Encounter (Addendum)
Patient left a voicemail stating that she has been trying to get a refill on her water pill HCTZ. Patient stated the doctor did not authorize the refill. Patient wants to know if she is to continue the water pill? Looks like it was refilled 11/27/18 #90/3.

## 2019-01-29 NOTE — Telephone Encounter (Signed)
She should have refills - please refill if not  I want to continue it

## 2019-01-30 NOTE — Telephone Encounter (Signed)
Called pharmacy and they realized it was an issue with her name. Rx was filled in Nov 2020 for a year. They said they will refill med now. Called pt and advise her it was an issue on the pharmacy's end but they are going to refill med now

## 2019-03-30 ENCOUNTER — Ambulatory Visit: Payer: Medicare Other

## 2019-03-30 ENCOUNTER — Ambulatory Visit: Payer: Medicare PPO | Attending: Internal Medicine

## 2019-03-30 ENCOUNTER — Ambulatory Visit: Payer: Medicare PPO

## 2019-03-30 DIAGNOSIS — Z23 Encounter for immunization: Secondary | ICD-10-CM

## 2019-03-30 NOTE — Progress Notes (Signed)
   Covid-19 Vaccination Clinic  Name:  Sahni Tordoff    MRN: [id number] DOB: August 08, 1955  03/30/2019  Ms. Badua was observed post Covid-19 immunization for 15 minutes without incident. She was provided with Vaccine Information Sheet and instruction to access the V-Safe system.   Ms. Curling was instructed to call 911 with any severe reactions post vaccine: Marland Kitchen Difficulty breathing  . Swelling of face and throat  . A fast heartbeat  . A bad rash all over body  . Dizziness and weakness   Immunizations Administered    Name Date Dose VIS Date Route   Pfizer COVID-19 Vaccine 03/30/2019  1:17 PM 0.3 mL 12/15/2018 Intramuscular   Manufacturer: [place]   Lot: [id number]   [place]: [id number]

## 2019-04-01 ENCOUNTER — Ambulatory Visit: Payer: Medicare Other

## 2019-04-20 ENCOUNTER — Other Ambulatory Visit: Payer: Self-pay | Admitting: Family Medicine

## 2019-04-20 ENCOUNTER — Telehealth: Payer: Self-pay | Admitting: Family Medicine

## 2019-04-20 DIAGNOSIS — Z1231 Encounter for screening mammogram for malignant neoplasm of breast: Secondary | ICD-10-CM

## 2019-04-20 NOTE — Telephone Encounter (Signed)
error 

## 2019-04-24 ENCOUNTER — Ambulatory Visit: Payer: Medicare PPO | Attending: Internal Medicine

## 2019-04-24 DIAGNOSIS — Z23 Encounter for immunization: Secondary | ICD-10-CM

## 2019-04-24 NOTE — Progress Notes (Signed)
   Covid-19 Vaccination Clinic  Name:  Rebecca [name]    MRN: [id number] DOB: Jun 21, 1955  04/24/2019  [name] was observed post Covid-19 immunization for 30 minutes based on pre-vaccination screening without incident. She was provided with Vaccine Information Sheet and instruction to access the V-Safe system.   [name] was instructed to call 911 with any severe reactions post vaccine: Marland Kitchen Difficulty breathing  . Swelling of face and throat  . A fast heartbeat  . A bad rash all over body  . Dizziness and weakness   Immunizations Administered    Name Date Dose VIS Date Route   Pfizer COVID-19 Vaccine 04/24/2019  1:35 PM 0.3 mL 02/28/2018 Intramuscular   Manufacturer: [place]   Lot: [id number]   [place]: [id number]

## 2019-05-25 ENCOUNTER — Encounter (INDEPENDENT_AMBULATORY_CARE_PROVIDER_SITE_OTHER): Payer: Self-pay

## 2019-05-29 ENCOUNTER — Encounter: Payer: Self-pay | Admitting: Family Medicine

## 2019-05-29 ENCOUNTER — Ambulatory Visit (INDEPENDENT_AMBULATORY_CARE_PROVIDER_SITE_OTHER): Payer: Medicare PPO | Admitting: Family Medicine

## 2019-05-29 ENCOUNTER — Other Ambulatory Visit: Payer: Self-pay

## 2019-05-29 ENCOUNTER — Ambulatory Visit: Payer: Medicare PPO | Admitting: Family Medicine

## 2019-05-29 VITALS — BP 136/82 | HR 89 | Temp 97.7°F | Ht 66.25 in | Wt 221.2 lb

## 2019-05-29 DIAGNOSIS — Z6835 Body mass index (BMI) 35.0-35.9, adult: Secondary | ICD-10-CM

## 2019-05-29 DIAGNOSIS — R009 Unspecified abnormalities of heart beat: Secondary | ICD-10-CM | POA: Diagnosis not present

## 2019-05-29 DIAGNOSIS — E66812 Obesity, class 2: Secondary | ICD-10-CM

## 2019-05-29 DIAGNOSIS — F430 Acute stress reaction: Secondary | ICD-10-CM | POA: Diagnosis not present

## 2019-05-29 DIAGNOSIS — I10 Essential (primary) hypertension: Secondary | ICD-10-CM | POA: Diagnosis not present

## 2019-05-29 MED ORDER — METOPROLOL SUCCINATE ER 25 MG PO TB24: 25.0000 mg | ORAL_TABLET | Freq: Every day | ORAL | 3 refills | Status: DC

## 2019-05-29 NOTE — Assessment & Plan Note (Signed)
Pt has noted labile bp lately - likely in response to stress BP: 136/82  Also feels like heart races occasionally   Disc trial of very low dose toprol xl to help regulate this  Potential side eff discussed and inst to let us know  F/u planned Lifestyle habits reviewed

## 2019-05-29 NOTE — Assessment & Plan Note (Signed)
Somewhat severe currently  Pt talks extensively about stress of caring for 2 parent with dementia and not always getting the support from siblings she needs  I feel this is the most likely cause of inc bp and HR along with panic attacks in the middle of the night  Ref made to counselor

## 2019-05-29 NOTE — Assessment & Plan Note (Signed)
Commended on walking  Enc to get away from junk food/convenience eating

## 2019-05-29 NOTE — Progress Notes (Signed)
Subjective:    Patient ID: Rebecca [name], female    DOB: 01/10/1955, 64 y.o.   MRN: [id number]  This visit occurred during the SARS-CoV-2 public health emergency.  Safety protocols were in place, including screening questions prior to the visit, additional usage of staff PPE, and extensive cleaning of exam room while observing appropriate contact time as indicated for disinfecting solutions.    HPI Pt present with c/o elevated HR and BP concerns   Wt Readings from Last 3 Encounters:  05/29/19 221 lb 3 oz (100.3 kg)  11/27/18 232 lb 1 oz (105.3 kg)  03/10/18 215 lb 9 oz (97.8 kg)  wt loss noted  35.43 kg/m   Trying hard to take care of her parents with siblings  A lot of stress    HTN BP Readings from Last 3 Encounters:  05/29/19 (!) 146/88  11/27/18 130/79  03/10/18 122/78   Takes amlodipine-benazepril 5-20 mg once daily  Also hctz 50 mg and klor con   Feels like pulse is higher than usual  Pulse Readings from Last 3 Encounters:  05/29/19 89  11/27/18 66  03/10/18 64   This is on and off  It wakes her up in the middle of the night   One episode this am when caring for her parents    This seemed to get worse after the covid vaccine     Lab Results  Component Value Date   CREATININE 0.76 11/20/2018   BUN 17 11/20/2018   NA 139 11/20/2018   K 3.6 11/20/2018   CL 102 11/20/2018   CO2 28 11/20/2018    She had covid vaccines on 3/26 and then 4/20 pfizer   Patient Active Problem List   Diagnosis Date Noted  . Heart rate problem 05/29/2019  . Medicare welcome exam 11/27/2018  . Estrogen deficiency 11/27/2018  . Encounter for pre-employment examination 11/02/2016  . Fungal nail infection 04/27/2016  . Sinusitis, chronic 03/12/2015  . Hyperlipidemia 10/30/2013  . Stress reaction 03/07/2013  . History of radius fracture 11/23/2012  . Undiagnosed cardiac murmurs 11/23/2012  . Class 2 severe obesity due to excess calories with serious comorbidity and body  mass index (BMI) of 35.0 to 35.9 in adult ([place]) 09/14/2011  . Family history of colon cancer requiring screening colonoscopy 09/14/2011  . Routine general medical examination at a health care facility 09/05/2011  . Mixed incontinence urge and stress 04/14/2011  . Hemorrhoids, internal, with bleeding 10/12/2010  . External hemorrhoids 11/04/2009  . ALLERGIC RHINITIS 08/13/2009  . HYPOKALEMIA 04/28/2007  . Prediabetes 11/15/2006  . 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  . BUNIONS, RIGHT FOOT 07/19/2006  . 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 Essential hypertension 07/06/2006  . URINARY INCONTINENCE, MIXED 07/06/2006   Past Medical History:  Diagnosis Date  . Allergy   . Cataract    beginnings of   . Glaucoma   . Heart murmur    noted on PCP exam 11/23/2012; "does not appear urgent"  . Hemorrhoids   . Hypertension    under control with meds., has been on med. > 20 yr.  . Radius fracture 11/22/2012   left, comminuted   Past Surgical History:  Procedure Laterality Date  . ABDOMINAL HYSTERECTOMY  06/02/2000   partial  . COLONOSCOPY  03/2006  . COLONOSCOPY WITH PROPOFOL  11/15/2011  . ENDOMETRIAL ABLATION    . OPEN REDUCTION INTERNAL FIXATION (ORIF) DISTAL RADIAL FRACTURE Left 11/29/2012   Procedure: OPEN REDUCTION INTERNAL FIXATION (ORIF) LEFT  DISTAL RADIAL FRACTURE;  Surgeon: Wynonia Sours, MD;  Location: [place];  Service: Orthopedics;  Laterality: Left;  . PARTIAL HYSTERECTOMY     Social History   Tobacco Use  . Smoking status: Never Smoker  . Smokeless tobacco: Never Used  Substance Use Topics  . Alcohol use: No  . Drug use: No   Family History  Problem Relation Age of Onset  . Hypertension Father   . Coronary artery disease Father        with CABG  . Hypertension Mother   . Colon cancer Mother   . Diabetes Mother   . Breast cancer Maternal Aunt   . Diabetes Maternal Aunt   . Colon cancer Maternal Uncle   . Colon cancer Paternal Uncle   . Colon polyps Sister   .  Esophageal cancer Neg Hx   . Rectal cancer Neg Hx   . Stomach cancer Neg Hx    Allergies  Allergen Reactions  . Amoxicillin Hives and Swelling  . Spironolactone Diarrhea  . Augmentin [Amoxicillin-Pot Clavulanate] Diarrhea    Tore stomach up  . Shrimp [Shellfish Allergy] Hives and Swelling   Current Outpatient Medications on File Prior to Visit  Medication Sig Dispense Refill  . amLODipine-benazepril (LOTREL) 5-20 MG capsule Take 1 capsule by mouth daily. 90 capsule 3  . fluticasone (FLONASE) 50 MCG/ACT nasal spray Place 2 sprays into both nostrils daily as needed for allergies or rhinitis. 48 g 3  . Guaifenesin (MUCINEX MAXIMUM STRENGTH) 1200 MG TB12 Take 1 tablet (1,200 mg total) by mouth every 12 (twelve) hours as needed. 14 tablet 1  . hydrochlorothiazide (HYDRODIURIL) 50 MG tablet Take 1 tablet (50 mg total) by mouth daily. 90 tablet 3  . hydrocortisone (ANUCORT-HC) 25 MG suppository UNWRAP AND INSERT ONE SUPPOSITORY RECTALLY EVERY NIGHT AT BEDTIME AS NEEDED FOR HEMORRHOIDS 12 suppository 0  . hydrocortisone (ANUSOL-HC) 2.5 % rectal cream Apply to affected area on hemorrhoids once daily as needed 30 g 0  . hyoscyamine (LEVSIN SL) 0.125 MG SL tablet Place 1 tablet (0.125 mg total) under the tongue every 4 (four) hours as needed (rectal spasm). 30 tablet 1  . latanoprost (XALATAN) 0.005 % ophthalmic solution Place 1 drop into both eyes.     . meloxicam (MOBIC) 15 MG tablet Take 1 tablet (15 mg total) by mouth daily as needed for pain. With food for wrist and hand pain 30 tablet 1  . Multiple Vitamin (MULTIVITAMIN) tablet Take 1 tablet by mouth daily.      . Omega-3 Fatty Acids (FISH OIL CONCENTRATE PO) Take 1 capsule by mouth daily.     . potassium chloride (KLOR-CON) 10 MEQ tablet Take 1 tablet (10 mEq total) by mouth daily. 90 tablet 3   No current facility-administered medications on file prior to visit.    Review of Systems  Constitutional: Negative for activity change, appetite  change, fatigue, fever and unexpected weight change.  HENT: Negative for congestion, ear pain, rhinorrhea, sinus pressure and sore throat.   Eyes: Negative for pain, redness and visual disturbance.  Respiratory: Negative for cough, shortness of breath and wheezing.   Cardiovascular: Negative for chest pain, palpitations and leg swelling.       HR goes up at times /feels her heart beating hard  Gastrointestinal: Negative for abdominal pain, blood in stool, constipation and diarrhea.  Endocrine: Negative for polydipsia and polyuria.  Genitourinary: Negative for dysuria, frequency and urgency.  Musculoskeletal: Negative for arthralgias, back pain and myalgias.  Skin: Negative  for pallor and rash.  Allergic/Immunologic: Negative for environmental allergies.  Neurological: Negative for dizziness, syncope and headaches.  Hematological: Negative for adenopathy. Does not bruise/bleed easily.  Psychiatric/Behavioral: Positive for decreased concentration and sleep disturbance. Negative for dysphoric mood. The patient is nervous/anxious.        Suspect panic attacks at night        Objective:   Physical Exam Constitutional:      General: She is not in acute distress.    Appearance: Normal appearance. She is well-developed. She is obese. She is not ill-appearing or diaphoretic.  HENT:     Head: Normocephalic and atraumatic.     Mouth/Throat:     Mouth: Mucous membranes are moist.  Eyes:     General: No scleral icterus.    Conjunctiva/sclera: Conjunctivae normal.     Pupils: Pupils are equal, round, and reactive to light.  Neck:     Thyroid: No thyromegaly.     Vascular: No carotid bruit or JVD.  Cardiovascular:     Rate and Rhythm: Normal rate and regular rhythm.     Pulses: Normal pulses.     Heart sounds: Normal heart sounds. No gallop.   Pulmonary:     Effort: Pulmonary effort is normal. No respiratory distress.     Breath sounds: Normal breath sounds. No wheezing or rales.    Abdominal:     General: Bowel sounds are normal. There is no distension or abdominal bruit.     Palpations: Abdomen is soft. There is no mass.     Tenderness: There is no abdominal tenderness.  Musculoskeletal:        General: No tenderness.     Cervical back: Normal range of motion and neck supple.     Right lower leg: No edema.     Left lower leg: No edema.  Lymphadenopathy:     Cervical: No cervical adenopathy.  Skin:    General: Skin is warm and dry.     Findings: No rash.  Neurological:     Mental Status: She is alert.     Sensory: No sensory deficit.     Motor: No tremor.     Coordination: Coordination normal.     Deep Tendon Reflexes: Reflexes are normal and symmetric. Reflexes normal.  Psychiatric:        Attention and Perception: Attention normal.        Mood and Affect: Mood is anxious.        Speech: Speech normal.        Cognition and Memory: Cognition and memory normal.     Comments: Anxious  Almost tearful  Discusses stressors candidly           Assessment & Plan:   Problem List Items Addressed This Visit      Cardiovascular and Mediastinum   Essential hypertension    Pt has noted labile bp lately - likely in response to stress BP: 136/82  Also feels like heart races occasionally   Disc trial of very low dose toprol xl to help regulate this  Potential side eff discussed and inst to let us know  F/u planned Lifestyle habits reviewed        Relevant Medications   metoprolol succinate (TOPROL-XL) 25 MG 24 hr tablet     Other   Class 2 severe obesity due to excess calories with serious comorbidity and body mass index (BMI) of 35.0 to 35.9 in adult ([place])    Commended on walking  Enc  to get away from junk food/convenience eating       Stress reaction - Primary    Somewhat severe currently  Pt talks extensively about stress of caring for 2 parent with dementia and not always getting the support from siblings she needs  I feel this is the most  likely cause of inc bp and HR along with panic attacks in the middle of the night  Ref made to counselor       Relevant Orders   Ambulatory referral to Psychology   Heart rate problem    Pt states at times HR goes up (no palpitations or cp) This generally correlates with anxiety and stressors  Nl today as well as bp Disc trial of low dose toprol xl 25 mg to try at night Disc poss side eff Will update F/u planned

## 2019-05-29 NOTE — Assessment & Plan Note (Signed)
Pt states at times HR goes up (no palpitations or cp) This generally correlates with anxiety and stressors  Nl today as well as bp Disc trial of low dose toprol xl 25 mg to try at night Disc poss side eff Will update F/u planned

## 2019-05-29 NOTE — Patient Instructions (Addendum)
Please scale back on caffeine gradually -it makes a difference in your heart rate   Try to eat less sodium and fast food  Keep walking   I placed a referral for counseling- the office will call you in the next 1-2 weeks to set that up   Start metoprolol xl 25 mg one pill daily in evening-this will help level out pulse and blood pressure  If any side effects or problems hold it and let me know   Follow up in 6-8 weeks

## 2019-06-12 ENCOUNTER — Other Ambulatory Visit: Payer: Self-pay | Admitting: Family Medicine

## 2019-06-12 DIAGNOSIS — Z1231 Encounter for screening mammogram for malignant neoplasm of breast: Secondary | ICD-10-CM

## 2019-07-05 ENCOUNTER — Ambulatory Visit (INDEPENDENT_AMBULATORY_CARE_PROVIDER_SITE_OTHER): Payer: Medicare PPO | Admitting: Psychology

## 2019-07-05 DIAGNOSIS — F418 Other specified anxiety disorders: Secondary | ICD-10-CM

## 2019-07-14 ENCOUNTER — Other Ambulatory Visit: Payer: Self-pay

## 2019-07-14 ENCOUNTER — Ambulatory Visit
Admission: EM | Admit: 2019-07-14 | Discharge: 2019-07-14 | Disposition: A | Discharge status: Home / Self Care | Payer: Medicare PPO | Attending: Physician Assistant | Admitting: Physician Assistant

## 2019-07-14 ENCOUNTER — Ambulatory Visit: Payer: Self-pay

## 2019-07-14 DIAGNOSIS — G479 Sleep disorder, unspecified: Secondary | ICD-10-CM | POA: Diagnosis not present

## 2019-07-14 DIAGNOSIS — R002 Palpitations: Secondary | ICD-10-CM

## 2019-07-14 MED ORDER — HYDROXYZINE HCL 25 MG PO TABS: 25.0000 mg | ORAL_TABLET | Freq: Every evening | ORAL | 0 refills | Status: DC | PRN

## 2019-07-14 NOTE — Discharge Instructions (Signed)
Hydroxyzine as needed at night to see if this will help you sleep. Heart murmur heard on exam today, please follow up with PCP as scheduled for recheck. If passing out, having chest pain, shortness of breath, go to the emergency department for further evaluation.

## 2019-07-14 NOTE — ED Triage Notes (Signed)
Pt presents with c/o not being able to sleep and feeling fatigued. 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

## 2019-07-14 NOTE — ED Provider Notes (Signed)
+ [facility]    CSN: [id number] Arrival date & time: 07/14/19  1251      History   Chief Complaint Chief Complaint  Patient presents with  . Fatigue  . unable to sleep    HPI Rebecca [name] is a 64 y.o. female.   64 year old female with history of HTN comes in for 2 week history of trouble staying asleep. States no trouble falling asleep, but would get wake up every 3 hours throughout the night. She was seen by PCP 2 months ago, at the time, will feel heart racing with occasional waking up at night and was started on metoprolol PRN. This seemed to help at times. Now she feels "heart fluttering" when she wakes up at night. Denies chest pain, shortness of breath. Denies exertional fatigue, exertional chest pain, syncope. Denies URI symptoms, urinary symptoms, fever. Nausea in the morning without worsening during oral intake. Denies heart burn. Full time caregiver to parents with increased stress.      Past Medical History:  Diagnosis Date  . Allergy   . Cataract    beginnings of   . Glaucoma   . Heart murmur    noted on PCP exam 11/23/2012; "does not appear urgent"  . Hemorrhoids   . Hypertension    under control with meds., has been on med. > 20 yr.  . Radius fracture 11/22/2012   left, comminuted    Patient Active Problem List   Diagnosis Date Noted  . Heart rate problem 05/29/2019  . Medicare welcome exam 11/27/2018  . Estrogen deficiency 11/27/2018  . Encounter for pre-employment examination 11/02/2016  . Fungal nail infection 04/27/2016  . Sinusitis, chronic 03/12/2015  . Hyperlipidemia 10/30/2013  . Stress reaction 03/07/2013  . History of radius fracture 11/23/2012  . Undiagnosed cardiac murmurs 11/23/2012  . Class 2 severe obesity due to excess calories with serious comorbidity and body mass index (BMI) of 35.0 to 35.9 in adult ([place]) 09/14/2011  . Family history of colon cancer requiring screening colonoscopy 09/14/2011  . Routine general  medical examination at a health care facility 09/05/2011  . Mixed incontinence urge and stress 04/14/2011  . Hemorrhoids, internal, with bleeding 10/12/2010  . External hemorrhoids 11/04/2009  . ALLERGIC RHINITIS 08/13/2009  . HYPOKALEMIA 04/28/2007  . Prediabetes 11/15/2006  . 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  . BUNIONS, RIGHT FOOT 07/19/2006  . 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 Essential hypertension 07/06/2006  . URINARY INCONTINENCE, MIXED 07/06/2006   Past Surgical History:  Procedure Laterality Date  . ABDOMINAL HYSTERECTOMY  06/02/2000   partial  . COLONOSCOPY  03/2006  . COLONOSCOPY WITH PROPOFOL  11/15/2011  . ENDOMETRIAL ABLATION    . OPEN REDUCTION INTERNAL FIXATION (ORIF) DISTAL RADIAL FRACTURE Left 11/29/2012   Procedure: OPEN REDUCTION INTERNAL FIXATION (ORIF) LEFT  DISTAL RADIAL FRACTURE;  Surgeon: Wynonia Sours, MD;  Location: [place];  Service: Orthopedics;  Laterality: Left;  . PARTIAL HYSTERECTOMY      OB History   No obstetric history on file.      Home Medications    Prior to Admission medications   Medication Sig Start Date End Date Taking? Authorizing Provider  amLODipine-benazepril (LOTREL) 5-20 MG capsule Take 1 capsule by mouth daily. 11/27/18   Tower, Wynelle Fanny, MD  fluticasone (FLONASE) 50 MCG/ACT nasal spray Place 2 sprays into both nostrils daily as needed for allergies or rhinitis. 11/25/17   Tower, Wynelle Fanny, MD  Guaifenesin [facility] MAXIMUM STRENGTH) 1200 MG TB12 Take  1 tablet (1,200 mg total) by mouth every 12 (twelve) hours as needed. 03/24/17   Ivar Drape D, PA  hydrochlorothiazide (HYDRODIURIL) 50 MG tablet Take 1 tablet (50 mg total) by mouth daily. 11/27/18   Tower, Wynelle Fanny, MD  hydrocortisone (ANUCORT-HC) 25 MG suppository UNWRAP AND INSERT ONE SUPPOSITORY RECTALLY EVERY NIGHT AT BEDTIME AS NEEDED FOR HEMORRHOIDS 10/10/15   Pleas Koch, NP  hydrocortisone (ANUSOL-HC) 2.5 % rectal cream Apply to affected area on hemorrhoids once  daily as needed 10/10/15   Pleas Koch, NP  hydrOXYzine (ATARAX/VISTARIL) 25 MG tablet Take 1-2 tablets (25-50 mg total) by mouth at bedtime as needed. 07/14/19   Tasia Catchings, Enid Maultsby V, PA-C  hyoscyamine (LEVSIN SL) 0.125 MG SL tablet Place 1 tablet (0.125 mg total) under the tongue every 4 (four) hours as needed (rectal spasm). 11/23/16   Pyrtle, Lajuan Lines, MD  latanoprost (XALATAN) 0.005 % ophthalmic solution Place 1 drop into both eyes.  06/27/13   [provider]  meloxicam (MOBIC) 15 MG tablet Take 1 tablet (15 mg total) by mouth daily as needed for pain. With food for wrist and hand pain 04/12/17   Tower, Wynelle Fanny, MD  metoprolol succinate (TOPROL-XL) 25 MG 24 hr tablet Take 1 tablet (25 mg total) by mouth daily. 05/29/19   Tower, Wynelle Fanny, MD  Multiple Vitamin (MULTIVITAMIN) tablet Take 1 tablet by mouth daily.      [provider]  Omega-3 Fatty Acids (FISH OIL CONCENTRATE PO) Take 1 capsule by mouth daily.     [provider]  potassium chloride (KLOR-CON) 10 MEQ tablet Take 1 tablet (10 mEq total) by mouth daily. 11/27/18   Tower, Wynelle Fanny, MD    Family History Family History  Problem Relation Age of Onset  . Hypertension Father   . Coronary artery disease Father        with CABG  . Dementia Father   . Hypertension Mother   . Colon cancer Mother   . Diabetes Mother   . Dementia Mother   . Breast cancer Maternal Aunt   . Diabetes Maternal Aunt   . Colon cancer Maternal Uncle   . Colon cancer Paternal Uncle   . Colon polyps Sister   . Esophageal cancer Neg Hx   . Rectal cancer Neg Hx   . Stomach cancer Neg Hx     Social History Social History   Tobacco Use  . Smoking status: Never Smoker  . Smokeless tobacco: Never Used  Substance Use Topics  . Alcohol use: No  . Drug use: No     Allergies   Amoxicillin, Spironolactone, Augmentin [amoxicillin-pot clavulanate], and Shrimp [shellfish allergy]   Review of Systems Review of Systems  Reason unable to  perform ROS: See HPI as above.     Physical Exam Triage Vital Signs ED Triage Vitals  Enc Vitals Group     BP 07/14/19 1258 133/80     Pulse Rate 07/14/19 1258 77     Resp 07/14/19 1258 16     Temp 07/14/19 1258 97.9 F (36.6 C)     Temp Source 07/14/19 1258 Oral     SpO2 07/14/19 1258 94 %     Weight --      Height --      Head Circumference --      Peak Flow --      Pain Score 07/14/19 1305 0     Pain Loc --      Pain Edu? --  Excl. in GC? --    No data found.  Updated Vital Signs BP 133/80 (BP Location: Left Arm)   Pulse 77   Temp 97.9 F (36.6 C) (Oral)   Resp 16   SpO2 94%   Physical Exam Constitutional:      General: She is not in acute distress.    Appearance: Normal appearance. She is well-developed. She is not toxic-appearing or diaphoretic.  HENT:     Head: Normocephalic and atraumatic.  Eyes:     Conjunctiva/sclera: Conjunctivae normal.     Pupils: Pupils are equal, round, and reactive to light.  Cardiovascular:     Rate and Rhythm: Normal rate and regular rhythm.     Heart sounds: Murmur heard.  No friction rub. No gallop.   Pulmonary:     Effort: Pulmonary effort is normal. No respiratory distress.     Comments: LCTAB Abdominal:     General: Bowel sounds are normal.     Palpations: Abdomen is soft.     Tenderness: There is no abdominal tenderness. There is no guarding or rebound.  Musculoskeletal:     Cervical back: Normal range of motion and neck supple.  Skin:    General: Skin is warm and dry.  Neurological:     Mental Status: She is alert and oriented to person, place, and time.      UC Treatments / Results  Labs (all labs ordered are listed, but only abnormal results are displayed) Labs Reviewed - No data to display  EKG   Radiology No results found.  Procedures Procedures (including critical care time)  Medications Ordered in UC Medications - No data to display  Initial Impression / Assessment and Plan / UC Course    I have reviewed the triage vital signs and the nursing notes.  Pertinent labs & imaging results that were available during my care of the patient were reviewed by me and considered in my medical decision making (see chart for details).    Patient without palpitations/heart fluttering at this time. EKG deferred. TSH wnl 7 months ago. Will try hydroxyzine at nighttime for possible stress induced symptoms. Otherwise to decrease caffeine intake for now and continue to monitor symptoms. To follow up with PCP as scheduled for further evaluation. Return precautions given. Patient expresses understanding and agrees to plan.  Final Clinical Impressions(s) / UC Diagnoses   Final diagnoses:  Trouble in sleeping  Palpitations    ED Prescriptions    Medication Sig Dispense Auth. Provider   hydrOXYzine (ATARAX/VISTARIL) 25 MG tablet Take 1-2 tablets (25-50 mg total) by mouth at bedtime as needed. 12 tablet Ok Edwards, PA-C     PDMP not reviewed this encounter.   Ok Edwards, PA-C 07/14/19 1359

## 2019-07-18 ENCOUNTER — Ambulatory Visit: Payer: Medicare PPO | Admitting: Family Medicine

## 2019-07-20 ENCOUNTER — Ambulatory Visit: Payer: Medicare PPO | Admitting: Psychology

## 2019-07-23 ENCOUNTER — Ambulatory Visit (INDEPENDENT_AMBULATORY_CARE_PROVIDER_SITE_OTHER): Payer: Medicare PPO | Admitting: Family Medicine

## 2019-07-23 ENCOUNTER — Other Ambulatory Visit: Payer: Self-pay

## 2019-07-23 ENCOUNTER — Encounter: Payer: Self-pay | Admitting: Family Medicine

## 2019-07-23 VITALS — BP 131/70 | HR 68 | Temp 96.9°F | Ht 66.25 in | Wt 223.6 lb

## 2019-07-23 DIAGNOSIS — I10 Essential (primary) hypertension: Secondary | ICD-10-CM

## 2019-07-23 DIAGNOSIS — F430 Acute stress reaction: Secondary | ICD-10-CM | POA: Diagnosis not present

## 2019-07-23 DIAGNOSIS — F5104 Psychophysiologic insomnia: Secondary | ICD-10-CM

## 2019-07-23 DIAGNOSIS — G4700 Insomnia, unspecified: Secondary | ICD-10-CM | POA: Insufficient documentation

## 2019-07-23 DIAGNOSIS — Z6835 Body mass index (BMI) 35.0-35.9, adult: Secondary | ICD-10-CM

## 2019-07-23 MED ORDER — METOPROLOL SUCCINATE ER 25 MG PO TB24: 25.0000 mg | ORAL_TABLET | Freq: Every day | ORAL | 3 refills | Status: DC

## 2019-07-23 NOTE — Assessment & Plan Note (Signed)
Doing a little better  Some insomnia problems (sleep latency) with rapid HR at times after loosing her metoprolol  Refilled this  Rev coping techniques and stressed imp of self care

## 2019-07-23 NOTE — Assessment & Plan Note (Signed)
bp in fair control at this time  BP Readings from Last 1 Encounters:  07/23/19 131/70  she is off her metoprolol after loosing it- we sent to pharmacy No changes needed Most recent labs reviewed  Disc lifstyle change with low sodium diet and exercise

## 2019-07-23 NOTE — Assessment & Plan Note (Signed)
Discussed how this problem influences overall health and the risks it imposes  Reviewed plan for weight loss with lower calorie diet (via better food choices and also portion control or program like weight watchers) and exercise building up to or more than 30 minutes 5 days per week including some aerobic activity   Pt has little time for self care unfortunately

## 2019-07-23 NOTE — Assessment & Plan Note (Signed)
Reviewed her UC note in detail  Turns out she lost her metoprolol-that increases her risk for palpitations if she cannot sleep at night Sent it to the pharmacy She also stopped a gummy biotin vitamin and thinks that caused problems also  Disc sleep hygiene Urged to continue weaning caffeine  Handout given  Has hydroxyzine to take as needed-will keep in reserve but has not tried/needed yet

## 2019-07-23 NOTE — Progress Notes (Signed)
Subjective:    Patient ID: Rebecca [name], female    DOB: 1955/03/18, 64 y.o.   MRN: [id number]  This visit occurred during the SARS-CoV-2 public health emergency.  Safety protocols were in place, including screening questions prior to the visit, additional usage of staff PPE, and extensive cleaning of exam room while observing appropriate contact time as indicated for disinfecting solutions.    HPI Pt presents for UC  follow up   Wt Readings from Last 3 Encounters:  07/23/19 223 lb 9 oz (101.4 kg)  05/29/19 221 lb 3 oz (100.3 kg)  11/27/18 232 lb 1 oz (105.3 kg)   35.81 kg/m   She was seen on 7/10 at [facility] urgent care for fatigue and sleep problems   C/o trouble staying asleep Wakes up every 3 hours  occ heart flutters-has metoprolol for prn for that (per pt she actually lost it)  She accidentally threw out the bag   She also prev took a biotin gummy for hair-may have make her heart rate off as well  Better off of that    No exertional fatigue or cp or heartburn ? Stress induced symptoms  She was given hydroxyzine for trial at night   (has not tried that yet)  Did recommend decreasing caffeine   Stress is always very high with care giving  She thinks she is doing better with it    Lab Results  Component Value Date   TSH 2.17 11/20/2018   Lab Results  Component Value Date   WBC 3.8 (L) 11/20/2018   HGB 13.4 11/20/2018   HCT 39.2 11/20/2018   MCV 91.9 11/20/2018   PLT 295.0 11/20/2018    BP Readings from Last 3 Encounters:  07/23/19 (!) 142/74  07/14/19 133/80  05/29/19 136/82   Better on 2nd check  BP: 131/70  Did not have metoprolol today  Pulse Readings from Last 3 Encounters:  07/23/19 68  07/14/19 77  05/29/19 89   She has made an effort to wean caffeine   Patient Active Problem List   Diagnosis Date Noted  . Insomnia 07/23/2019  . Heart rate problem 05/29/2019  . Medicare welcome exam 11/27/2018  . Estrogen deficiency 11/27/2018  .  Encounter for pre-employment examination 11/02/2016  . Fungal nail infection 04/27/2016  . Sinusitis, chronic 03/12/2015  . Hyperlipidemia 10/30/2013  . Stress reaction 03/07/2013  . History of radius fracture 11/23/2012  . Undiagnosed cardiac murmurs 11/23/2012  . Class 2 severe obesity due to excess calories with serious comorbidity and body mass index (BMI) of 35.0 to 35.9 in adult ([place]) 09/14/2011  . Family history of colon cancer requiring screening colonoscopy 09/14/2011  . Routine general medical examination at a health care facility 09/05/2011  . Mixed incontinence urge and stress 04/14/2011  . Hemorrhoids, internal, with bleeding 10/12/2010  . External hemorrhoids 11/04/2009  . ALLERGIC RHINITIS 08/13/2009  . HYPOKALEMIA 04/28/2007  . Prediabetes 11/15/2006  . 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  . BUNIONS, RIGHT FOOT 07/19/2006  . 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 Essential hypertension 07/06/2006  . URINARY INCONTINENCE, MIXED 07/06/2006   Past Medical History:  Diagnosis Date  . Allergy   . Cataract    beginnings of   . Glaucoma   . Heart murmur    noted on PCP exam 11/23/2012; "does not appear urgent"  . Hemorrhoids   . Hypertension    under control with meds., has been on med. > 20 yr.  . Radius fracture 11/22/2012   left, comminuted  Past Surgical History:  Procedure Laterality Date  . ABDOMINAL HYSTERECTOMY  06/02/2000   partial  . COLONOSCOPY  03/2006  . COLONOSCOPY WITH PROPOFOL  11/15/2011  . ENDOMETRIAL ABLATION    . OPEN REDUCTION INTERNAL FIXATION (ORIF) DISTAL RADIAL FRACTURE Left 11/29/2012   Procedure: OPEN REDUCTION INTERNAL FIXATION (ORIF) LEFT  DISTAL RADIAL FRACTURE;  Surgeon: Wynonia Sours, MD;  Location: [facility];  Service: Orthopedics;  Laterality: Left;  . PARTIAL HYSTERECTOMY     Social History   Tobacco Use  . Smoking status: Never Smoker  . Smokeless tobacco: Never Used  Substance Use Topics  . Alcohol use: No  . Drug use: No    Family History  Problem Relation Age of Onset  . Hypertension Father   . Coronary artery disease Father        with CABG  . Dementia Father   . Hypertension Mother   . Colon cancer Mother   . Diabetes Mother   . Dementia Mother   . Breast cancer Maternal Aunt   . Diabetes Maternal Aunt   . Colon cancer Maternal Uncle   . Colon cancer Paternal Uncle   . Colon polyps Sister   . Esophageal cancer Neg Hx   . Rectal cancer Neg Hx   . Stomach cancer Neg Hx    Allergies  Allergen Reactions  . Amoxicillin Hives and Swelling  . Spironolactone Diarrhea  . Augmentin [Amoxicillin-Pot Clavulanate] Diarrhea    Tore stomach up  . Shrimp [Shellfish Allergy] Hives and Swelling   Current Outpatient Medications on File Prior to Visit  Medication Sig Dispense Refill  . amLODipine-benazepril (LOTREL) 5-20 MG capsule Take 1 capsule by mouth daily. 90 capsule 3  . fluticasone (FLONASE) 50 MCG/ACT nasal spray Place 2 sprays into both nostrils daily as needed for allergies or rhinitis. 48 g 3  . Guaifenesin (MUCINEX MAXIMUM STRENGTH) 1200 MG TB12 Take 1 tablet (1,200 mg total) by mouth every 12 (twelve) hours as needed. 14 tablet 1  . hydrochlorothiazide (HYDRODIURIL) 50 MG tablet Take 1 tablet (50 mg total) by mouth daily. 90 tablet 3  . hydrocortisone (ANUCORT-HC) 25 MG suppository UNWRAP AND INSERT ONE SUPPOSITORY RECTALLY EVERY NIGHT AT BEDTIME AS NEEDED FOR HEMORRHOIDS 12 suppository 0  . hydrocortisone (ANUSOL-HC) 2.5 % rectal cream Apply to affected area on hemorrhoids once daily as needed 30 g 0  . hydrOXYzine (ATARAX/VISTARIL) 25 MG tablet Take 1-2 tablets (25-50 mg total) by mouth at bedtime as needed. 12 tablet 0  . hyoscyamine (LEVSIN SL) 0.125 MG SL tablet Place 1 tablet (0.125 mg total) under the tongue every 4 (four) hours as needed (rectal spasm). 30 tablet 1  . latanoprost (XALATAN) 0.005 % ophthalmic solution Place 1 drop into both eyes.     . meloxicam (MOBIC) 15 MG tablet Take 1  tablet (15 mg total) by mouth daily as needed for pain. With food for wrist and hand pain 30 tablet 1  . Multiple Vitamin (MULTIVITAMIN) tablet Take 1 tablet by mouth daily.      . Omega-3 Fatty Acids (FISH OIL CONCENTRATE PO) Take 1 capsule by mouth daily.     . potassium chloride (KLOR-CON) 10 MEQ tablet Take 1 tablet (10 mEq total) by mouth daily. 90 tablet 3   No current facility-administered medications on file prior to visit.     Review of Systems  Constitutional: Negative for activity change, appetite change, fatigue, fever and unexpected weight change.  HENT: Negative for congestion, ear  pain, rhinorrhea, sinus pressure and sore throat.   Eyes: Negative for pain, redness and visual disturbance.  Respiratory: Negative for cough, shortness of breath and wheezing.   Cardiovascular: Negative for chest pain and palpitations.  Gastrointestinal: Negative for abdominal pain, blood in stool, constipation and diarrhea.  Endocrine: Negative for polydipsia and polyuria.  Genitourinary: Negative for dysuria, frequency and urgency.  Musculoskeletal: Negative for arthralgias, back pain and myalgias.  Skin: Negative for pallor and rash.  Allergic/Immunologic: Negative for environmental allergies.  Neurological: Negative for dizziness, syncope and headaches.  Hematological: Negative for adenopathy. Does not bruise/bleed easily.  Psychiatric/Behavioral: Negative for decreased concentration and dysphoric mood. The patient is not nervous/anxious.        Objective:   Physical Exam Constitutional:      General: She is not in acute distress.    Appearance: Normal appearance. She is well-developed. She is obese. She is not ill-appearing or diaphoretic.  HENT:     Head: Normocephalic and atraumatic.     Mouth/Throat:     Mouth: Mucous membranes are moist.  Eyes:     Conjunctiva/sclera: Conjunctivae normal.     Pupils: Pupils are equal, round, and reactive to light.  Neck:     Thyroid: No  thyromegaly.     Vascular: No carotid bruit or JVD.  Cardiovascular:     Rate and Rhythm: Normal rate and regular rhythm.     Heart sounds: Normal heart sounds. No gallop.   Pulmonary:     Effort: Pulmonary effort is normal. No respiratory distress.     Breath sounds: Normal breath sounds. No wheezing or rales.  Abdominal:     General: Bowel sounds are normal. There is no distension or abdominal bruit.     Palpations: Abdomen is soft. There is no mass.     Tenderness: There is no abdominal tenderness.  Musculoskeletal:     Cervical back: Normal range of motion and neck supple.     Right lower leg: No edema.     Left lower leg: No edema.  Lymphadenopathy:     Cervical: No cervical adenopathy.  Skin:    General: Skin is warm and dry.     Coloration: Skin is not pale.     Findings: No rash.  Neurological:     Mental Status: She is alert.     Sensory: No sensory deficit.     Motor: No tremor.     Coordination: Coordination normal.     Deep Tendon Reflexes: Reflexes are normal and symmetric. Reflexes normal.  Psychiatric:        Attention and Perception: Attention normal.        Mood and Affect: Mood normal. Mood is not anxious or depressed.        Speech: Speech normal.        Behavior: Behavior normal.        Thought Content: Thought content normal.        Cognition and Memory: Cognition and memory normal.           Assessment & Plan:   Problem List Items Addressed This Visit      Cardiovascular and Mediastinum   Essential hypertension    bp in fair control at this time  BP Readings from Last 1 Encounters:  07/23/19 131/70  she is off her metoprolol after loosing it- we sent to pharmacy No changes needed Most recent labs reviewed  Disc lifstyle change with low sodium diet and exercise  Relevant Medications   metoprolol succinate (TOPROL-XL) 25 MG 24 hr tablet     Other   Class 2 severe obesity due to excess calories with serious comorbidity and body  mass index (BMI) of 35.0 to 35.9 in adult [facility])    Discussed how this problem influences overall health and the risks it imposes  Reviewed plan for weight loss with lower calorie diet (via better food choices and also portion control or program like weight watchers) and exercise building up to or more than 30 minutes 5 days per week including some aerobic activity   Pt has little time for self care unfortunately      Stress reaction    Doing a little better  Some insomnia problems (sleep latency) with rapid HR at times after loosing her metoprolol  Refilled this  Rev coping techniques and stressed imp of self care       Insomnia - Primary    Reviewed her UC note in detail  Turns out she lost her metoprolol-that increases her risk for palpitations if she cannot sleep at night Sent it to the pharmacy She also stopped a gummy biotin vitamin and thinks that caused problems also  Disc sleep hygiene Urged to continue weaning caffeine  Handout given  Has hydroxyzine to take as needed-will keep in reserve but has not tried/needed yet

## 2019-07-23 NOTE — Patient Instructions (Signed)
Get your metoprolol Take care of yourself   You can try the hydroxyzine for sleep if needed   Eat healthy  Try to get exercise  Avoid caffeine   If you cannot sleep-then read a paper book instead of watching TV

## 2019-07-26 ENCOUNTER — Ambulatory Visit
Admission: RE | Admit: 2019-07-26 | Discharge: 2019-07-26 | Disposition: A | Discharge status: Home / Self Care | Payer: Medicare PPO | Source: Ambulatory Visit | Attending: Family Medicine | Admitting: Family Medicine

## 2019-07-26 ENCOUNTER — Other Ambulatory Visit: Payer: Self-pay

## 2019-07-26 DIAGNOSIS — Z1231 Encounter for screening mammogram for malignant neoplasm of breast: Secondary | ICD-10-CM

## 2019-10-08 ENCOUNTER — Other Ambulatory Visit: Payer: Self-pay | Admitting: Family Medicine

## 2019-11-25 ENCOUNTER — Telehealth: Payer: Self-pay | Admitting: Family Medicine

## 2019-11-25 DIAGNOSIS — I10 Essential (primary) hypertension: Secondary | ICD-10-CM

## 2019-11-25 DIAGNOSIS — E7800 Pure hypercholesterolemia, unspecified: Secondary | ICD-10-CM

## 2019-11-25 DIAGNOSIS — R7303 Prediabetes: Secondary | ICD-10-CM

## 2019-11-25 NOTE — Telephone Encounter (Signed)
-----   Message from [name] sent at 11/14/2019  4:28 PM EST ----- Regarding: Lab orders for Monday, 11.22.21  AWV lab orders, please.

## 2019-11-26 ENCOUNTER — Other Ambulatory Visit: Payer: Self-pay

## 2019-11-26 ENCOUNTER — Other Ambulatory Visit (INDEPENDENT_AMBULATORY_CARE_PROVIDER_SITE_OTHER): Payer: Medicare PPO

## 2019-11-26 DIAGNOSIS — E7800 Pure hypercholesterolemia, unspecified: Secondary | ICD-10-CM | POA: Diagnosis not present

## 2019-11-26 DIAGNOSIS — R7303 Prediabetes: Secondary | ICD-10-CM | POA: Diagnosis not present

## 2019-11-26 DIAGNOSIS — I10 Essential (primary) hypertension: Secondary | ICD-10-CM

## 2019-11-26 LAB — CBC WITH DIFFERENTIAL/PLATELET
Basophils Absolute: 0 10*3/uL (ref 0.0–0.1)
Basophils Relative: 0.8 % (ref 0.0–3.0)
Eosinophils Absolute: 0.1 10*3/uL (ref 0.0–0.7)
Eosinophils Relative: 3.9 % (ref 0.0–5.0)
HCT: 39.5 % (ref 36.0–46.0)
Hemoglobin: 13.4 g/dL (ref 12.0–15.0)
Lymphocytes Relative: 41.1 % (ref 12.0–46.0)
Lymphs Abs: 1.4 10*3/uL (ref 0.7–4.0)
MCHC: 34 g/dL (ref 30.0–36.0)
MCV: 91.2 fl (ref 78.0–100.0)
Monocytes Absolute: 0.4 10*3/uL (ref 0.1–1.0)
Monocytes Relative: 11.6 % (ref 3.0–12.0)
Neutro Abs: 1.5 10*3/uL (ref 1.4–7.7)
Neutrophils Relative %: 42.6 % — ABNORMAL LOW (ref 43.0–77.0)
Platelets: 297 10*3/uL (ref 150.0–400.0)
RBC: 4.34 Mil/uL (ref 3.87–5.11)
RDW: 13.1 % (ref 11.5–15.5)
WBC: 3.5 10*3/uL — ABNORMAL LOW (ref 4.0–10.5)

## 2019-11-26 LAB — COMPREHENSIVE METABOLIC PANEL
ALT: 14 U/L (ref 0–35)
AST: 17 U/L (ref 0–37)
Albumin: 4.2 g/dL (ref 3.5–5.2)
Alkaline Phosphatase: 49 U/L (ref 39–117)
BUN: 13 mg/dL (ref 6–23)
CO2: 29 mEq/L (ref 19–32)
Calcium: 9.1 mg/dL (ref 8.4–10.5)
Chloride: 101 mEq/L (ref 96–112)
Creatinine, Ser: 0.82 mg/dL (ref 0.40–1.20)
GFR: 74.46 mL/min (ref >60.00)
Glucose, Bld: 105 mg/dL — ABNORMAL HIGH (ref 70–99)
Potassium: 3.9 mEq/L (ref 3.5–5.1)
Sodium: 138 mEq/L (ref 135–145)
Total Bilirubin: 0.5 mg/dL (ref 0.2–1.2)
Total Protein: 6.9 g/dL (ref 6.0–8.3)

## 2019-11-26 LAB — LIPID PANEL
Cholesterol: 190 mg/dL (ref 0–200)
HDL: 54.4 mg/dL (ref >39.00)
LDL Cholesterol: 121 mg/dL — ABNORMAL HIGH (ref 0–99)
NonHDL: 135.62
Total CHOL/HDL Ratio: 3
Triglycerides: 74 mg/dL (ref 0.0–149.0)
VLDL: 14.8 mg/dL (ref 0.0–40.0)

## 2019-11-26 LAB — HEMOGLOBIN A1C: Hgb A1c MFr Bld: 6.1 % (ref 4.6–6.5)

## 2019-11-26 LAB — TSH: TSH: 1.82 u[IU]/mL (ref 0.35–4.50)

## 2019-12-03 ENCOUNTER — Encounter: Payer: Self-pay | Admitting: Family Medicine

## 2019-12-03 ENCOUNTER — Other Ambulatory Visit: Payer: Self-pay

## 2019-12-03 ENCOUNTER — Ambulatory Visit (INDEPENDENT_AMBULATORY_CARE_PROVIDER_SITE_OTHER): Payer: Medicare PPO | Admitting: Family Medicine

## 2019-12-03 VITALS — BP 130/78 | HR 62 | Temp 96.9°F | Ht 66.25 in | Wt 216.3 lb

## 2019-12-03 DIAGNOSIS — Z23 Encounter for immunization: Secondary | ICD-10-CM

## 2019-12-03 DIAGNOSIS — Z800 Family history of malignant neoplasm of digestive organs: Secondary | ICD-10-CM | POA: Diagnosis not present

## 2019-12-03 DIAGNOSIS — E7800 Pure hypercholesterolemia, unspecified: Secondary | ICD-10-CM

## 2019-12-03 DIAGNOSIS — E669 Obesity, unspecified: Secondary | ICD-10-CM | POA: Diagnosis not present

## 2019-12-03 DIAGNOSIS — R009 Unspecified abnormalities of heart beat: Secondary | ICD-10-CM

## 2019-12-03 DIAGNOSIS — I10 Essential (primary) hypertension: Secondary | ICD-10-CM | POA: Diagnosis not present

## 2019-12-03 DIAGNOSIS — R7303 Prediabetes: Secondary | ICD-10-CM

## 2019-12-03 DIAGNOSIS — Z0000 Encounter for general adult medical examination without abnormal findings: Secondary | ICD-10-CM | POA: Insufficient documentation

## 2019-12-03 DIAGNOSIS — E876 Hypokalemia: Secondary | ICD-10-CM

## 2019-12-03 MED ORDER — HYDROCHLOROTHIAZIDE 50 MG PO TABS
50.0000 mg | ORAL_TABLET | Freq: Every day | ORAL | 3 refills | Status: DC
Start: 2019-12-03 — End: 2020-11-24

## 2019-12-03 MED ORDER — POTASSIUM CHLORIDE ER 10 MEQ PO TBCR: 10.0000 meq | EXTENDED_RELEASE_TABLET | Freq: Every day | ORAL | 3 refills | Status: DC

## 2019-12-03 MED ORDER — AMLODIPINE BESY-BENAZEPRIL HCL 5-20 MG PO CAPS
ORAL_CAPSULE | ORAL | 3 refills | Status: DC
Start: 2019-12-03 — End: 2020-04-28

## 2019-12-03 NOTE — Patient Instructions (Addendum)
If you are interested in the new shingles vaccine (Shingrix) - call your local pharmacy to check on coverage and availability  If affordable, get on a wait list at your pharmacy to get the vaccine.  Stop at check out to schedule nurse visit for vaccines in 1-2 weeks   For bone health  Try to get 1200-1500 mg of calcium per day with at least 1000 iu of vitamin D - for bone health  If calcium constipates then just take the vitamin D  Please work on your advance directive (blue packet)   Look for the book Keep Sharp by Coral Spikes   Flu shot today

## 2019-12-03 NOTE — Assessment & Plan Note (Signed)
Colonoscopy done 2018 with 5 y recall

## 2019-12-03 NOTE — Assessment & Plan Note (Signed)
Reviewed health habits including diet and exercise and skin cancer prevention Reviewed appropriate screening tests for age  Also reviewed health mt list, fam hx and immunization status , as well as social and family history   See HPI Labs reviewed Discussed shingrix-interested if covered  Due for pcv 23 -will return for that and get flu shot today (had covid booster on Saturday)  Flu shotgiven dexa rev- nl 2020  No falls or fx and counseled on vit D and ca for bone health Given booklet to work on adv directive No cognitive concerns Nl hearing screen  utd eye/vision care  Enc pt to keep up wt loss effort

## 2019-12-03 NOTE — Assessment & Plan Note (Signed)
Metoprolol xl 25 mg daily as needed

## 2019-12-03 NOTE — Assessment & Plan Note (Signed)
bp in fair control at this time  BP Readings from Last 1 Encounters:  12/03/19 130/78   No changes needed Most recent labs reviewed  Disc lifstyle change with low sodium diet and exercise  Plan to continue amlodipine-benazepril 5-20 mg daily  hctz 50 mg daily  Metoprolol l 25 mg prn (for high pulse) -has not been a problem lately

## 2019-12-03 NOTE — Assessment & Plan Note (Signed)
Discussed how this problem influences overall health and the risks it imposes  Reviewed plan for weight loss with lower calorie diet (via better food choices and also portion control or program like weight watchers) and exercise building up to or more than 30 minutes 5 days per week including some aerobic activity   Commended on wt loss so far  

## 2019-12-03 NOTE — Assessment & Plan Note (Signed)
Lab Results  Component Value Date   HGBA1C 6.1 11/26/2019   Commended on wt loss disc imp of low glycemic diet and wt loss to prevent DM2

## 2019-12-03 NOTE — Assessment & Plan Note (Signed)
Some improvement with better diet  Disc goals for lipids and reasons to control them Rev last labs with pt Rev low sat fat diet in detail

## 2019-12-03 NOTE — Progress Notes (Signed)
Subjective:    Patient ID: Rebecca [name], female    DOB: 07-20-1955, 64 y.o.   MRN: [id number]  This visit occurred during the SARS-CoV-2 public health emergency.  Safety protocols were in place, including screening questions prior to the visit, additional usage of staff PPE, and extensive cleaning of exam room while observing appropriate contact time as indicated for disinfecting solutions.    HPI Pt presents for amw and health mt exam with rev of chronic medical problems   I have personally reviewed the Medicare Annual Wellness questionnaire and have noted 1. The patient's medical and social history 2. Their use of alcohol, tobacco or illicit drugs 3. Their current medications and supplements 4. The patient's functional ability including ADL's, fall risks, home safety risks and hearing or visual             impairment. 5. Diet and physical activities 6. Evidence for depression or mood disorders  The patients weight, height, BMI have been recorded in the chart and visual acuity is per eye clinic.  I have made referrals, counseling and provided education to the patient based review of the above and I have provided the pt with a written personalized care plan for preventive services. Reviewed and updated provider list, see scanned forms.  See scanned forms.  Routine anticipatory guidance given to patient.  See health maintenance. Colon cancer screening  11/18 colonoscopy with 5 y recall  Breast cancer screening  Mammogram 7/21  Self breast exam- no lumps  Flu vaccine-wants to put out a week since she just had covid booster  Tetanus vaccine 11/14 Tdap Pneumovax-pt had prevnar 11/27/18 and is due for pcv 23  Zoster vaccine-has not had/ considering shingrix  She is covid vaccinated with a booster -pfizer Dexa 12/20 - BMD in the normal range  Falls-none Fractures-none  Supplements Exercise - equipt at home- total gym   Advance directive-given materials to work on Cognitive  function addressed- see scanned forms- and if abnormal then additional documentation follows.   No problems with memory aside from walking into a room and forgetting why  She is active brain wise   PMH and SH reviewed  Meds, vitals, and allergies reviewed.   ROS: See HPI.  Otherwise negative.    Weight : Wt Readings from Last 3 Encounters:  12/03/19 216 lb 5 oz (98.1 kg)  07/23/19 223 lb 9 oz (101.4 kg)  05/29/19 221 lb 3 oz (100.3 kg)  lost wt cutting back/ watching what she eats-more eating  Has exercise equip Not eating out as much  34.65 kg/m  Lost her mother  Trying to support her father  Dealing with a lot right now -trying to take care of herself   Some chronic nasal congestion   Hearing/vision:  Hearing Screening   125Hz  250Hz  500Hz  1000Hz  2000Hz  3000Hz  4000Hz  6000Hz  8000Hz   Right ear:   40 40 40  40    Left ear:   40 40 40  40    Vision Screening Comments: Pt had eye exam in April 2021 with [facility] team: Karolina Zamor- pcp Py[facility]- GI  HTN  bp is stable today  No cp or palpitations or headaches or edema  No side effects to medicines  BP Readings from Last 3 Encounters:  12/03/19 (!) 142/66  07/23/19 131/70  07/14/19 133/80     Re check BP: 130/78   Takes amlodipine-benazepril 5-20 mg once daily  hctz 50 mg once daily  Metoprolol xl 25 mg -  changed to prn   Pulse Readings from Last 3 Encounters:  12/03/19 62  07/23/19 68  07/14/19 77   Lab Results  Component Value Date   CREATININE 0.82 11/26/2019   BUN 13 11/26/2019   NA 138 11/26/2019   K 3.9 11/26/2019   CL 101 11/26/2019   CO2 29 11/26/2019     Hyperlipidemia  Lab Results  Component Value Date   CHOL 190 11/26/2019   CHOL 207 (H) 11/20/2018   CHOL 197 10/06/2017   Lab Results  Component Value Date   HDL 54.40 11/26/2019   HDL 54.60 11/20/2018   HDL 55.80 10/06/2017   Lab Results  Component Value Date   LDLCALC 121 (H) 11/26/2019   LDLCALC 127 (H) 11/20/2018    LDLCALC 126 (H) 10/06/2017   Lab Results  Component Value Date   TRIG 74.0 11/26/2019   TRIG 126.0 11/20/2018   TRIG 74.0 10/06/2017   Lab Results  Component Value Date   CHOLHDL 3 11/26/2019   CHOLHDL 4 11/20/2018   CHOLHDL 4 10/06/2017   Lab Results  Component Value Date   LDLDIRECT 107.3 08/11/2009    Very good with diet    Prediabetes Lab Results  Component Value Date   HGBA1C 6.1 11/26/2019  this is down from 6.3  Really watching diet/loosing wt  Patient Active Problem List   Diagnosis Date Noted  . Medicare annual wellness visit, initial 12/03/2019  . Insomnia 07/23/2019  . Heart rate problem 05/29/2019  . Medicare welcome exam 11/27/2018  . Estrogen deficiency 11/27/2018  . Encounter for pre-employment examination 11/02/2016  . Fungal nail infection 04/27/2016  . Sinusitis, chronic 03/12/2015  . Hyperlipidemia 10/30/2013  . Stress reaction 03/07/2013  . History of radius fracture 11/23/2012  . Undiagnosed cardiac murmurs 11/23/2012  . Obesity (BMI 30.0-34.9) 09/14/2011  . Family history of colon cancer requiring screening colonoscopy 09/14/2011  . Routine general medical examination at a health care facility 09/05/2011  . Mixed incontinence urge and stress 04/14/2011  . Hemorrhoids, internal, with bleeding 10/12/2010  . External hemorrhoids 11/04/2009  . ALLERGIC RHINITIS 08/13/2009  . HYPOKALEMIA 04/28/2007  . Prediabetes 11/15/2006  . 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  . BUNIONS, RIGHT FOOT 07/19/2006  . 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 Essential hypertension 07/06/2006  . URINARY INCONTINENCE, MIXED 07/06/2006   Past Medical History:  Diagnosis Date  . Allergy   . Cataract    beginnings of   . Glaucoma   . Heart murmur    noted on PCP exam 11/23/2012; "does not appear urgent"  . Hemorrhoids   . Hypertension    under control with meds., has been on med. > 20 yr.  . Radius fracture 11/22/2012   left, comminuted   Past Surgical History:  Procedure  Laterality Date  . ABDOMINAL HYSTERECTOMY  06/02/2000   partial  . COLONOSCOPY  03/2006  . COLONOSCOPY WITH PROPOFOL  11/15/2011  . ENDOMETRIAL ABLATION    . OPEN REDUCTION INTERNAL FIXATION (ORIF) DISTAL RADIAL FRACTURE Left 11/29/2012   Procedure: OPEN REDUCTION INTERNAL FIXATION (ORIF) LEFT  DISTAL RADIAL FRACTURE;  Surgeon: Wynonia Sours, MD;  Location: [facility];  Service: Orthopedics;  Laterality: Left;  . PARTIAL HYSTERECTOMY     Social History   Tobacco Use  . Smoking status: Never Smoker  . Smokeless tobacco: Never Used  Substance Use Topics  . Alcohol use: No  . Drug use: No   Family History  Problem Relation Age of Onset  . Hypertension  Father   . Coronary artery disease Father        with CABG  . Dementia Father   . Hypertension Mother   . Colon cancer Mother   . Diabetes Mother   . Dementia Mother   . Breast cancer Maternal Aunt   . Diabetes Maternal Aunt   . Colon cancer Maternal Uncle   . Colon cancer Paternal Uncle   . Colon polyps Sister   . Esophageal cancer Neg Hx   . Rectal cancer Neg Hx   . Stomach cancer Neg Hx    Allergies  Allergen Reactions  . Amoxicillin Hives and Swelling  . Spironolactone Diarrhea  . Augmentin [Amoxicillin-Pot Clavulanate] Diarrhea    Tore stomach up  . Shrimp [Shellfish Allergy] Hives and Swelling   Current Outpatient Medications on File Prior to Visit  Medication Sig Dispense Refill  . fluticasone (FLONASE) 50 MCG/ACT nasal spray Place 2 sprays into both nostrils daily as needed for allergies or rhinitis. 48 g 3  . Guaifenesin (MUCINEX MAXIMUM STRENGTH) 1200 MG TB12 Take 1 tablet (1,200 mg total) by mouth every 12 (twelve) hours as needed. 14 tablet 1  . hydrocortisone (ANUCORT-HC) 25 MG suppository UNWRAP AND INSERT ONE SUPPOSITORY RECTALLY EVERY NIGHT AT BEDTIME AS NEEDED FOR HEMORRHOIDS 12 suppository 0  . hydrocortisone (ANUSOL-HC) 2.5 % rectal cream Apply to affected area on hemorrhoids once daily as  needed 30 g 0  . hydrOXYzine (ATARAX/VISTARIL) 25 MG tablet Take 1-2 tablets (25-50 mg total) by mouth at bedtime as needed. 12 tablet 0  . hyoscyamine (LEVSIN SL) 0.125 MG SL tablet Place 1 tablet (0.125 mg total) under the tongue every 4 (four) hours as needed (rectal spasm). 30 tablet 1  . latanoprost (XALATAN) 0.005 % ophthalmic solution Place 1 drop into both eyes.     . meloxicam (MOBIC) 15 MG tablet Take 1 tablet (15 mg total) by mouth daily as needed for pain. With food for wrist and hand pain 30 tablet 1  . metoprolol succinate (TOPROL-XL) 25 MG 24 hr tablet Take 1 tablet (25 mg total) by mouth daily. (Patient taking differently: Take 25 mg by mouth daily as needed. ) 90 tablet 3  . Multiple Vitamin (MULTIVITAMIN) tablet Take 1 tablet by mouth daily.      . Omega-3 Fatty Acids (FISH OIL CONCENTRATE PO) Take 1 capsule by mouth daily.      No current facility-administered medications on file prior to visit.    Review of Systems  Constitutional: Negative for activity change, appetite change, fatigue, fever and unexpected weight change.  HENT: Negative for congestion, ear pain, rhinorrhea, sinus pressure and sore throat.   Eyes: Negative for pain, redness and visual disturbance.  Respiratory: Negative for cough, shortness of breath and wheezing.   Cardiovascular: Negative for chest pain and palpitations.  Gastrointestinal: Negative for abdominal pain, blood in stool, constipation and diarrhea.  Endocrine: Negative for polydipsia and polyuria.  Genitourinary: Negative for dysuria, frequency and urgency.  Musculoskeletal: Negative for arthralgias, back pain and myalgias.  Skin: Negative for pallor and rash.  Allergic/Immunologic: Negative for environmental allergies.  Neurological: Negative for dizziness, syncope and headaches.  Hematological: Negative for adenopathy. Does not bruise/bleed easily.  Psychiatric/Behavioral: Negative for decreased concentration and dysphoric mood. The  patient is not nervous/anxious.        Grief after loosing mother       Objective:   Physical Exam Constitutional:      General: She is not in acute distress.  Appearance: Normal appearance. She is well-developed. She is obese. She is not ill-appearing or diaphoretic.  HENT:     Head: Normocephalic and atraumatic.     Right Ear: Tympanic membrane, ear canal and external ear normal.     Left Ear: Tympanic membrane, ear canal and external ear normal.     Nose: Nose normal. No congestion.     Mouth/Throat:     Mouth: Mucous membranes are moist.     Pharynx: Oropharynx is clear. No posterior oropharyngeal erythema.  Eyes:     General: No scleral icterus.    Extraocular Movements: Extraocular movements intact.     Conjunctiva/sclera: Conjunctivae normal.     Pupils: Pupils are equal, round, and reactive to light.  Neck:     Thyroid: No thyromegaly.     Vascular: No carotid bruit or JVD.  Cardiovascular:     Rate and Rhythm: Normal rate and regular rhythm.     Pulses: Normal pulses.     Heart sounds: Normal heart sounds. No gallop.   Pulmonary:     Effort: Pulmonary effort is normal. No respiratory distress.     Breath sounds: Normal breath sounds. No wheezing.     Comments: Good air exch Chest:     Chest wall: No tenderness.  Abdominal:     General: Bowel sounds are normal. There is no distension or abdominal bruit.     Palpations: Abdomen is soft. There is no mass.     Tenderness: There is no abdominal tenderness.     Hernia: No hernia is present.  Genitourinary:    Comments: Breast exam: No mass, nodules, thickening, tenderness, bulging, retraction, inflamation, nipple discharge or skin changes noted.  No axillary or clavicular LA.     Musculoskeletal:        General: No tenderness. Normal range of motion.     Cervical back: Normal range of motion and neck supple. No rigidity. No muscular tenderness.     Right lower leg: No edema.     Left lower leg: No edema.    Lymphadenopathy:     Cervical: No cervical adenopathy.  Skin:    General: Skin is warm and dry.     Coloration: Skin is not pale.     Findings: No erythema or rash.     Comments: Some skin tags  Neurological:     Mental Status: She is alert. Mental status is at baseline.     Cranial Nerves: No cranial nerve deficit.     Motor: No abnormal muscle tone.     Coordination: Coordination normal.     Gait: Gait normal.     Deep Tendon Reflexes: Reflexes are normal and symmetric. Reflexes normal.  Psychiatric:        Mood and Affect: Mood normal. Affect is not tearful.        Cognition and Memory: Cognition and memory normal.     Comments: Mentally sharp           Assessment & Plan:   Problem List Items Addressed This Visit      Cardiovascular and Mediastinum   Essential hypertension    bp in fair control at this time  BP Readings from Last 1 Encounters:  12/03/19 130/78   No changes needed Most recent labs reviewed  Disc lifstyle change with low sodium diet and exercise  Plan to continue amlodipine-benazepril 5-20 mg daily  hctz 50 mg daily  Metoprolol l 25 mg prn (for high pulse) -has not been  a problem lately      Relevant Medications   amLODipine-benazepril (LOTREL) 5-20 MG capsule   hydrochlorothiazide (HYDRODIURIL) 50 MG tablet     Other   Prediabetes    Lab Results  Component Value Date   HGBA1C 6.1 11/26/2019   Commended on wt loss disc imp of low glycemic diet and wt loss to prevent DM2       Routine general medical examination at a health care facility    Reviewed health habits including diet and exercise and skin cancer prevention Reviewed appropriate screening tests for age  Also reviewed health mt list, fam hx and immunization status , as well as social and family history   See HPI Labs reviewed Discussed shingrix-interested if covered  Due for pcv 23 -will return for that and get flu shot today (had covid booster on Saturday)  Flu  shotgiven dexa rev- nl 2020  No falls or fx and counseled on vit D and ca for bone health Given booklet to work on adv directive No cognitive concerns Nl hearing screen  utd eye/vision care  Enc pt to keep up wt loss effort      Obesity (BMI 30.0-34.9)    Discussed how this problem influences overall health and the risks it imposes  Reviewed plan for weight loss with lower calorie diet (via better food choices and also portion control or program like weight watchers) and exercise building up to or more than 30 minutes 5 days per week including some aerobic activity   Commended on wt loss so far       Family history of colon cancer requiring screening colonoscopy    Colonoscopy done 2018 with 5 y recall      Hyperlipidemia    Some improvement with better diet  Disc goals for lipids and reasons to control them Rev last labs with pt Rev low sat fat diet in detail       Relevant Medications   amLODipine-benazepril (LOTREL) 5-20 MG capsule   hydrochlorothiazide (HYDRODIURIL) 50 MG tablet   Heart rate problem    Metoprolol xl 25 mg daily as needed      Medicare annual wellness visit, initial - Primary    Reviewed health habits including diet and exercise and skin cancer prevention Reviewed appropriate screening tests for age  Also reviewed health mt list, fam hx and immunization status , as well as social and family history   See HPI Labs reviewed Discussed shingrix-interested if covered  Due for pcv 23 -will return for that and get flu shot today (had covid booster on Saturday)  Flu shotgiven dexa rev- nl 2020  No falls or fx and counseled on vit D and ca for bone health Given booklet to work on adv directive No cognitive concerns Nl hearing screen  utd eye/vision care  Enc pt to keep up wt loss effort        Other Visit Diagnoses    Hypokalemia       Relevant Medications   potassium chloride (KLOR-CON) 10 MEQ tablet   Need for influenza vaccination        Relevant Orders   Flu Vaccine QUAD High Dose(Fluad) (Completed)

## 2019-12-19 ENCOUNTER — Other Ambulatory Visit: Payer: Self-pay

## 2019-12-19 ENCOUNTER — Ambulatory Visit (INDEPENDENT_AMBULATORY_CARE_PROVIDER_SITE_OTHER): Payer: Medicare PPO

## 2019-12-19 DIAGNOSIS — Z23 Encounter for immunization: Secondary | ICD-10-CM | POA: Diagnosis not present

## 2019-12-19 NOTE — Progress Notes (Signed)
Per orders of Dr. Glori Bickers, injection of PSV-23, given by Aneta Mins, RN. Patient tolerated injection well in L Deltoid.

## 2019-12-21 ENCOUNTER — Other Ambulatory Visit: Payer: Self-pay | Admitting: Family Medicine

## 2019-12-21 DIAGNOSIS — E876 Hypokalemia: Secondary | ICD-10-CM

## 2019-12-25 ENCOUNTER — Other Ambulatory Visit: Payer: Self-pay | Admitting: Family Medicine

## 2019-12-25 DIAGNOSIS — E876 Hypokalemia: Secondary | ICD-10-CM

## 2020-02-12 ENCOUNTER — Other Ambulatory Visit: Payer: Self-pay | Admitting: Family Medicine

## 2020-04-04 ENCOUNTER — Other Ambulatory Visit: Payer: Self-pay | Admitting: Family Medicine

## 2020-04-10 ENCOUNTER — Other Ambulatory Visit: Payer: Self-pay | Admitting: Family Medicine

## 2020-04-26 ENCOUNTER — Other Ambulatory Visit: Payer: Self-pay | Admitting: Family Medicine

## 2020-06-08 ENCOUNTER — Encounter (INDEPENDENT_AMBULATORY_CARE_PROVIDER_SITE_OTHER): Payer: Self-pay

## 2020-07-22 ENCOUNTER — Encounter: Payer: Self-pay | Admitting: Family Medicine

## 2020-07-22 ENCOUNTER — Other Ambulatory Visit: Payer: Self-pay

## 2020-07-22 ENCOUNTER — Ambulatory Visit: Payer: Medicare PPO | Admitting: Family Medicine

## 2020-07-22 DIAGNOSIS — N644 Mastodynia: Secondary | ICD-10-CM | POA: Diagnosis not present

## 2020-07-22 NOTE — Progress Notes (Signed)
Subjective:    Patient ID: Rebecca [name], female    DOB: [birth year], 65 y.o.   MRN: [id number]  This visit occurred during the SARS-CoV-2 public health emergency.  Safety protocols were in place, including screening questions prior to the visit, additional usage of staff PPE, and extensive cleaning of exam room while observing appropriate contact time as indicated for disinfecting solutions.   HPI Pt presents with c/o breast tenderness  Wt Readings from Last 3 Encounters:  07/22/20 224 lb 6 oz (101.8 kg)  12/03/19 216 lb 5 oz (98.1 kg)  07/23/19 223 lb 9 oz (101.4 kg)   35.94 kg/m  Wt went up - after loosing parents   It was time for mammogram and she was asked if any symptoms Made note of this and had to come here before she schedules that   Bilateral  Mild  Noted when she hugged someone (mildly tender)  Nipples are more sensitive at times No lumps on self exam  No discharge   She worse her mother's bras -too loose  Has lost mom and dad    Caffeine - more lately  No more than 2 drinks per day Coffee  Big cups/mugs     Medications No hormones  Some MCT powder occ  (collagen in it)  No other supplements  Mammogram 7/21 MM 3D SCREEN BREAST BILATERAL (Accession [id number]) (Order [id number]) Imaging Date: 07/26/2019 Department: [facility] Released By: Robie Ridge Authorizing: Berlie Persky, Wynelle Fanny, MD      Exam Status  Status  Final [99]    PACS Intelerad Image Link   Show images for MM 3D SCREEN BREAST BILATERAL  Study Result  Narrative & Impression  CLINICAL DATA:  Screening.   EXAM: DIGITAL SCREENING BILATERAL MAMMOGRAM WITH TOMO AND CAD   COMPARISON:  Previous exam(s).   ACR Breast Density Category b: There are scattered areas of fibroglandular density.   FINDINGS: There are no findings suspicious for malignancy. Images were processed with CAD.   IMPRESSION: No mammographic evidence of malignancy. A result  letter of this screening mammogram will be mailed directly to the patient.   RECOMMENDATION: Screening mammogram in one year. (Code:[id number]-B-01Y)   BI-RADS CATEGORY  1: Negative.    M aunt had breast cancer   Patient Active Problem List   Diagnosis Date Noted   Breast pain 07/22/2020   Medicare annual wellness visit, initial 12/03/2019   Insomnia 07/23/2019   Heart rate problem 05/29/2019   Medicare welcome exam 11/27/2018   Estrogen deficiency 11/27/2018   Encounter for pre-employment examination 11/02/2016   Fungal nail infection 04/27/2016   Sinusitis, chronic 03/12/2015   Hyperlipidemia 10/30/2013   Stress reaction 03/07/2013   History of radius fracture 11/23/2012   Undiagnosed cardiac murmurs 11/23/2012   Obesity (BMI 30.0-34.9) 09/14/2011   Family history of colon cancer requiring screening colonoscopy 09/14/2011   Routine general medical examination at a health care facility 09/05/2011   Mixed incontinence urge and stress 04/14/2011   Hemorrhoids, internal, with bleeding 10/12/2010   External hemorrhoids 11/04/2009   ALLERGIC RHINITIS 08/13/2009   HYPOKALEMIA 04/28/2007   Prediabetes 11/15/2006   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   BUNIONS, RIGHT FOOT 07/19/2006   HSV 07/06/2006   Essential hypertension 07/06/2006   URINARY INCONTINENCE, MIXED 07/06/2006   Past Medical History:  Diagnosis Date   Allergy    Cataract    beginnings of    Glaucoma    Heart murmur    noted on PCP  exam 11/23/2012; "does not appear urgent"   Hemorrhoids    Hypertension    under control with meds., has been on med. > 20 yr.   Radius fracture 11/22/2012   left, comminuted   Past Surgical History:  Procedure Laterality Date   ABDOMINAL HYSTERECTOMY  06/02/2000   partial   COLONOSCOPY  03/2006   COLONOSCOPY WITH PROPOFOL  11/15/2011   ENDOMETRIAL ABLATION     OPEN REDUCTION INTERNAL FIXATION (ORIF) DISTAL RADIAL FRACTURE Left 11/29/2012   Procedure: OPEN REDUCTION INTERNAL  FIXATION (ORIF) LEFT  DISTAL RADIAL FRACTURE;  Surgeon: Wynonia Sours, MD;  Location: [facility];  Service: Orthopedics;  Laterality: Left;   PARTIAL HYSTERECTOMY     Social History   Tobacco Use   Smoking status: Never   Smokeless tobacco: Never  Substance Use Topics   Alcohol use: No   Drug use: No   Family History  Problem Relation Age of Onset   Hypertension Father    Coronary artery disease Father        with CABG   Dementia Father    Hypertension Mother    Colon cancer Mother    Diabetes Mother    Dementia Mother    Breast cancer Maternal Aunt    Diabetes Maternal Aunt    Colon cancer Maternal Uncle    Colon cancer Paternal Uncle    Colon polyps Sister    Esophageal cancer Neg Hx    Rectal cancer Neg Hx    Stomach cancer Neg Hx    Allergies  Allergen Reactions   Amoxicillin Hives and Swelling   Spironolactone Diarrhea   Augmentin [Amoxicillin-Pot Clavulanate] Diarrhea    Tore stomach up   Shrimp [Shellfish Allergy] Hives and Swelling   Current Outpatient Medications on File Prior to Visit  Medication Sig Dispense Refill   amLODipine-benazepril (LOTREL) 5-20 MG capsule TAKE 1 CAPSULE BY MOUTH EVERY DAY 90 capsule 1   fluticasone (FLONASE) 50 MCG/ACT nasal spray Place 2 sprays into both nostrils daily as needed for allergies or rhinitis. 48 g 3   Guaifenesin (MUCINEX MAXIMUM STRENGTH) 1200 MG TB12 Take 1 tablet (1,200 mg total) by mouth every 12 (twelve) hours as needed. 14 tablet 1   hydrochlorothiazide (HYDRODIURIL) 50 MG tablet Take 1 tablet (50 mg total) by mouth daily. 90 tablet 3   hydrocortisone (ANUCORT-HC) 25 MG suppository UNWRAP AND INSERT ONE SUPPOSITORY RECTALLY EVERY NIGHT AT BEDTIME AS NEEDED FOR HEMORRHOIDS 12 suppository 0   hydrocortisone (ANUSOL-HC) 2.5 % rectal cream Apply to affected area on hemorrhoids once daily as needed 30 g 0   hydrOXYzine (ATARAX/VISTARIL) 25 MG tablet Take 1-2 tablets (25-50 mg total) by mouth at bedtime as  needed. 12 tablet 0   hyoscyamine (LEVSIN SL) 0.125 MG SL tablet Place 1 tablet (0.125 mg total) under the tongue every 4 (four) hours as needed (rectal spasm). 30 tablet 1   latanoprost (XALATAN) 0.005 % ophthalmic solution Place 1 drop into both eyes.      meloxicam (MOBIC) 15 MG tablet Take 1 tablet (15 mg total) by mouth daily as needed for pain. With food for wrist and hand pain 30 tablet 1   metoprolol succinate (TOPROL-XL) 25 MG 24 hr tablet Take 1 tablet (25 mg total) by mouth daily. (Patient taking differently: Take 25 mg by mouth daily as needed.) 90 tablet 3   Multiple Vitamin (MULTIVITAMIN) tablet Take 1 tablet by mouth daily.       Omega-3 Fatty Acids (FISH  OIL CONCENTRATE PO) Take 1 capsule by mouth daily.      potassium chloride (KLOR-CON) 10 MEQ tablet TAKE 1 TABLET BY MOUTH EVERY DAY 90 tablet 3   No current facility-administered medications on file prior to visit.    Review of Systems  Constitutional:  Negative for activity change, appetite change, fatigue, fever and unexpected weight change.  HENT:  Negative for congestion, ear pain, rhinorrhea, sinus pressure and sore throat.   Eyes:  Negative for pain, redness and visual disturbance.  Respiratory:  Negative for cough, shortness of breath and wheezing.   Cardiovascular:  Negative for chest pain and palpitations.  Gastrointestinal:  Negative for abdominal pain, blood in stool, constipation and diarrhea.  Endocrine: Negative for polydipsia and polyuria.  Genitourinary:  Negative for dysuria, frequency, genital sores, urgency, vaginal bleeding and vaginal discharge.  Musculoskeletal:  Negative for arthralgias, back pain and myalgias.  Skin:  Negative for pallor and rash.  Allergic/Immunologic: Negative for environmental allergies.  Neurological:  Negative for dizziness, syncope and headaches.  Hematological:  Negative for adenopathy. Does not bruise/bleed easily.  Psychiatric/Behavioral:  Negative for decreased concentration  and dysphoric mood. The patient is not nervous/anxious.       Objective:   Physical Exam Constitutional:      General: She is not in acute distress.    Appearance: Normal appearance. She is obese. She is not ill-appearing.  HENT:     Mouth/Throat:     Mouth: Mucous membranes are moist.  Eyes:     Conjunctiva/sclera: Conjunctivae normal.     Pupils: Pupils are equal, round, and reactive to light.  Cardiovascular:     Rate and Rhythm: Normal rate and regular rhythm.     Heart sounds: Normal heart sounds.  Pulmonary:     Effort: Pulmonary effort is normal. No respiratory distress.     Breath sounds: Normal breath sounds. No stridor. No wheezing or rales.  Chest:     Chest wall: No tenderness.  Genitourinary:    Comments: Breast exam: No mass, nodules, thickening, bulging, retraction, inflamation, nipple discharge or skin changes noted.  No axillary or clavicular LA.    Some mild bilateral breast tendeness L breast is slightly bigger than the right   Musculoskeletal:     Cervical back: Normal range of motion and neck supple. No tenderness.  Lymphadenopathy:     Cervical: No cervical adenopathy.  Skin:    General: Skin is warm and dry.     Coloration: Skin is not jaundiced.     Findings: No bruising, erythema or rash.  Neurological:     Mental Status: She is alert.     Sensory: No sensory deficit.     Coordination: Coordination normal.  Psychiatric:        Mood and Affect: Mood normal.          Assessment & Plan:   Problem List Items Addressed This Visit       Other   Breast pain    Mild/bilateral and dull  Suspect factors incl Wt gain Unsupportive bra Walking w/o sport bra  Inc caffeine   Disc weaning caffeine and purchasing more supportive bra   diag mm and [place] ordered  Nl exam today except for tenderness        Relevant Orders   MM DIAG BREAST TOMO BILATERAL   US BREAST LTD UNI LEFT INC AXILLA   US BREAST LTD UNI RIGHT INC AXILLA

## 2020-07-22 NOTE — Patient Instructions (Addendum)
Wear a good sport bra when you walk   Wear a more supportive bra on regular basis (not your mom's)  Cut back on caffeine gradually  Drink more water  Avoid a lot of salt   I will order mammogram and ultrasound  You can call to make the appointment -the orders are in   Let us know if anything changes

## 2020-07-22 NOTE — Assessment & Plan Note (Signed)
Mild/bilateral and dull  Suspect factors incl Wt gain Unsupportive bra Walking w/o sport bra  Inc caffeine   Disc weaning caffeine and purchasing more supportive bra   diag mm and [place] ordered  Nl exam today except for tenderness

## 2020-08-02 ENCOUNTER — Ambulatory Visit: Payer: Medicare PPO

## 2020-08-03 ENCOUNTER — Emergency Department: Payer: Medicare PPO

## 2020-08-03 ENCOUNTER — Encounter: Payer: Self-pay | Admitting: Emergency Medicine

## 2020-08-03 ENCOUNTER — Other Ambulatory Visit: Payer: Self-pay

## 2020-08-03 ENCOUNTER — Emergency Department
Admission: EM | Admit: 2020-08-03 | Discharge: 2020-08-03 | Disposition: A | Discharge status: Home / Self Care | Payer: Medicare PPO | Attending: Emergency Medicine | Admitting: Emergency Medicine

## 2020-08-03 DIAGNOSIS — R519 Headache, unspecified: Secondary | ICD-10-CM | POA: Diagnosis present

## 2020-08-03 DIAGNOSIS — Z79899 Other long term (current) drug therapy: Secondary | ICD-10-CM | POA: Diagnosis not present

## 2020-08-03 DIAGNOSIS — I672 Cerebral atherosclerosis: Secondary | ICD-10-CM | POA: Diagnosis not present

## 2020-08-03 DIAGNOSIS — Z8673 Personal history of transient ischemic attack (TIA), and cerebral infarction without residual deficits: Secondary | ICD-10-CM | POA: Diagnosis not present

## 2020-08-03 DIAGNOSIS — I10 Essential (primary) hypertension: Secondary | ICD-10-CM

## 2020-08-03 LAB — URINALYSIS, COMPLETE (UACMP) WITH MICROSCOPIC
Bacteria, UA: NONE SEEN
Bilirubin Urine: NEGATIVE
Glucose, UA: NEGATIVE mg/dL
Ketones, ur: NEGATIVE mg/dL
Leukocytes,Ua: NEGATIVE
Nitrite: NEGATIVE
Protein, ur: NEGATIVE mg/dL
Specific Gravity, Urine: 1.009 (ref 1.005–1.030)
Squamous Epithelial / HPF: NONE SEEN (ref 0–5)
pH: 6 (ref 5.0–8.0)

## 2020-08-03 LAB — CBC
HCT: 38.9 % (ref 36.0–46.0)
Hemoglobin: 13.7 g/dL (ref 12.0–15.0)
MCH: 31.7 pg (ref 26.0–34.0)
MCHC: 35.2 g/dL (ref 30.0–36.0)
MCV: 90 fL (ref 80.0–100.0)
Platelets: 307 10*3/uL (ref 150–400)
RBC: 4.32 MIL/uL (ref 3.87–5.11)
RDW: 12 % (ref 11.5–15.5)
WBC: 4.4 10*3/uL (ref 4.0–10.5)
nRBC: 0 % (ref 0.0–0.2)

## 2020-08-03 LAB — BASIC METABOLIC PANEL
Anion gap: 9 (ref 5–15)
BUN: 16 mg/dL (ref 8–23)
CO2: 27 mmol/L (ref 22–32)
Calcium: 9.3 mg/dL (ref 8.9–10.3)
Chloride: 100 mmol/L (ref 98–111)
Creatinine, Ser: 0.81 mg/dL (ref 0.44–1.00)
GFR, Estimated: >60 mL/min (ref >60)
Glucose, Bld: 131 mg/dL — ABNORMAL HIGH (ref 70–99)
Potassium: 3.4 mmol/L — ABNORMAL LOW (ref 3.5–5.1)
Sodium: 136 mmol/L (ref 135–145)

## 2020-08-03 LAB — TROPONIN I (HIGH SENSITIVITY)
Troponin I (High Sensitivity): 5 ng/L (ref <18)
Troponin I (High Sensitivity): 4 ng/L (ref <18)

## 2020-08-03 NOTE — ED Triage Notes (Addendum)
PT arrived via POV with reports of elevated BP. Pt states she has been "feeling funny" for the past several days, Pt states she started checking her BP at 1pm today and checking every hour and increasing each time she took it.  Pt denies any HA, dizziness, blurred vision. Pt does c/o dry eyes.  Pt has hx of HTN.   Pt states Friday night when she woke up during the night her face had some numbness to it. Pt states she fell back to sleep and when she woke up the numbness was gone.   Denies any numbness since overnight Friday night.

## 2020-08-03 NOTE — Discharge Instructions (Addendum)
Please continue your blood pressure medications as prescribed.  I recommend that you check your blood pressure 1-2 times a day and keep a log of this that she can follow-up with your primary care doctor to discuss if any of your blood pressure medications need to be adjusted.  Please return to the emergency department if you develop severe headache, vision changes, numbness or weakness, chest pain or shortness of breath.

## 2020-08-03 NOTE — ED Provider Notes (Signed)
[facility] Emergency Department Provider Note  ____________________________________________   Event Date/Time   First MD Initiated Contact with Patient 08/03/20 0258     (approximate)  I have reviewed the triage vital signs and the nursing notes.   HISTORY  Chief Complaint Hypertension    HPI Rebecca [name] is a 65 y.o. female history of hypertension, hyperlipidemia who presents to the emergency department with concerns that her blood pressure was elevated at home.  States this week her head has "felt funny".  She denies having any headache.  She also reports over the past couple of weeks she has had some mild shortness of breath with exertion.  States she thought that this all could be due to her blood pressure so she started checking it every hour for the past several hours tonight.  States it continued to go up over time which made her more concerned.  She states it was as high as the 160s/100s at home.  She states that when she woke up from sleep this evening she had a little bit of numbness on the right upper lip that resolved when she rubbed it.  No other numbness, tingling or weakness.  She does state that her hands feel "stiff" in the morning when I ask her if she is having numbness but denies any numbness currently.  She denies chest pain or chest discomfort, vision changes.  She is on amlodipine, benazepril and hydrochlorothiazide.  Was started on metoprolol in November 2021 and was initially taking it as needed for elevated heart rate but for the past month has been taking it every day.  She reports compliance with all of her medications.  Her PCP is Dr. Glori Bickers.  It appears during her last visit her blood pressure was in the 140s/70s.        Past Medical History:  Diagnosis Date   Allergy    Cataract    beginnings of    Glaucoma    Heart murmur    noted on PCP exam 11/23/2012; "does not appear urgent"   Hemorrhoids    Hypertension    under  control with meds., has been on med. > 20 yr.   Radius fracture 11/22/2012   left, comminuted    Patient Active Problem List   Diagnosis Date Noted   Breast pain 07/22/2020   Medicare annual wellness visit, initial 12/03/2019   Insomnia 07/23/2019   Heart rate problem 05/29/2019   Medicare welcome exam 11/27/2018   Estrogen deficiency 11/27/2018   Encounter for pre-employment examination 11/02/2016   Fungal nail infection 04/27/2016   Sinusitis, chronic 03/12/2015   Hyperlipidemia 10/30/2013   Stress reaction 03/07/2013   History of radius fracture 11/23/2012   Undiagnosed cardiac murmurs 11/23/2012   Obesity (BMI 30.0-34.9) 09/14/2011   Family history of colon cancer requiring screening colonoscopy 09/14/2011   Routine general medical examination at a health care facility 09/05/2011   Mixed incontinence urge and stress 04/14/2011   Hemorrhoids, internal, with bleeding 10/12/2010   External hemorrhoids 11/04/2009   ALLERGIC RHINITIS 08/13/2009   HYPOKALEMIA 04/28/2007   Prediabetes 11/15/2006   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   BUNIONS, RIGHT FOOT 07/19/2006   HSV 07/06/2006   Essential hypertension 07/06/2006   URINARY INCONTINENCE, MIXED 07/06/2006    Past Surgical History:  Procedure Laterality Date   ABDOMINAL HYSTERECTOMY  06/02/2000   partial   COLONOSCOPY  03/2006   COLONOSCOPY WITH PROPOFOL  11/15/2011   ENDOMETRIAL ABLATION     OPEN  REDUCTION INTERNAL FIXATION (ORIF) DISTAL RADIAL FRACTURE Left 11/29/2012   Procedure: OPEN REDUCTION INTERNAL FIXATION (ORIF) LEFT  DISTAL RADIAL FRACTURE;  Surgeon: Wynonia Sours, MD;  Location: [facility];  Service: Orthopedics;  Laterality: Left;   PARTIAL HYSTERECTOMY      Prior to Admission medications   Medication Sig Start Date End Date Taking? Authorizing Provider  amLODipine-benazepril (LOTREL) 5-20 MG capsule TAKE 1 CAPSULE BY MOUTH EVERY DAY 04/28/20   Tower, Wynelle Fanny, MD  fluticasone (FLONASE) 50 MCG/ACT  nasal spray Place 2 sprays into both nostrils daily as needed for allergies or rhinitis. 11/25/17   Tower, Wynelle Fanny, MD  Guaifenesin [facility] MAXIMUM STRENGTH) 1200 MG TB12 Take 1 tablet (1,200 mg total) by mouth every 12 (twelve) hours as needed. 03/24/17   Ivar Drape D, PA  hydrochlorothiazide (HYDRODIURIL) 50 MG tablet Take 1 tablet (50 mg total) by mouth daily. 12/03/19   Tower, Wynelle Fanny, MD  hydrocortisone (ANUCORT-HC) 25 MG suppository UNWRAP AND INSERT ONE SUPPOSITORY RECTALLY EVERY NIGHT AT BEDTIME AS NEEDED FOR HEMORRHOIDS 10/10/15   Pleas Koch, NP  hydrocortisone (ANUSOL-HC) 2.5 % rectal cream Apply to affected area on hemorrhoids once daily as needed 10/10/15   Pleas Koch, NP  hydrOXYzine (ATARAX/VISTARIL) 25 MG tablet Take 1-2 tablets (25-50 mg total) by mouth at bedtime as needed. 07/14/19   Tasia Catchings, Amy V, PA-C  hyoscyamine (LEVSIN SL) 0.125 MG SL tablet Place 1 tablet (0.125 mg total) under the tongue every 4 (four) hours as needed (rectal spasm). 11/23/16   Pyrtle, Lajuan Lines, MD  latanoprost (XALATAN) 0.005 % ophthalmic solution Place 1 drop into both eyes.  06/27/13   [provider]  meloxicam (MOBIC) 15 MG tablet Take 1 tablet (15 mg total) by mouth daily as needed for pain. With food for wrist and hand pain 04/12/17   Tower, Wynelle Fanny, MD  metoprolol succinate (TOPROL-XL) 25 MG 24 hr tablet Take 1 tablet (25 mg total) by mouth daily. Patient taking differently: Take 25 mg by mouth daily as needed. 07/23/19   Tower, Wynelle Fanny, MD  Multiple Vitamin (MULTIVITAMIN) tablet Take 1 tablet by mouth daily.      [provider]  Omega-3 Fatty Acids (FISH OIL CONCENTRATE PO) Take 1 capsule by mouth daily.     [provider]  potassium chloride (KLOR-CON) 10 MEQ tablet TAKE 1 TABLET BY MOUTH EVERY DAY 12/25/19   Tower, Wynelle Fanny, MD    Allergies Amoxicillin, Spironolactone, Augmentin [amoxicillin-pot clavulanate], and Shrimp [shellfish allergy]  Family History   Problem Relation Age of Onset   Hypertension Father    Coronary artery disease Father        with CABG   Dementia Father    Hypertension Mother    Colon cancer Mother    Diabetes Mother    Dementia Mother    Breast cancer Maternal Aunt    Diabetes Maternal Aunt    Colon cancer Maternal Uncle    Colon cancer Paternal Uncle    Colon polyps Sister    Esophageal cancer Neg Hx    Rectal cancer Neg Hx    Stomach cancer Neg Hx     Social History Social History   Tobacco Use   Smoking status: Never   Smokeless tobacco: Never  Substance Use Topics   Alcohol use: No   Drug use: No    Review of Systems Constitutional: No fever. Eyes: No visual changes. ENT: No sore throat. Cardiovascular: Denies chest pain.  Respiratory: + shortness of breath. Gastrointestinal: No nausea, vomiting, diarrhea. Genitourinary: Negative for dysuria. Musculoskeletal: Negative for back pain. Skin: Negative for rash. Neurological: Negative for focal weakness or numbness.  ____________________________________________   PHYSICAL EXAM:  VITAL SIGNS: ED Triage Vitals  Enc Vitals Group     BP 08/03/20 0017 (!) 188/91     Pulse Rate 08/03/20 0017 78     Resp 08/03/20 0017 20     Temp 08/03/20 0017 98 F (36.7 C)     Temp Source 08/03/20 0017 Oral     SpO2 08/03/20 0017 97 %     Weight 08/03/20 0019 224 lb 6 oz (101.8 kg)     Height 08/03/20 0019 5' 6.25" (1.683 m)     Head Circumference --      Peak Flow --      Pain Score 08/03/20 0018 0     Pain Loc --      Pain Edu? --      Excl. in [place]? --    CONSTITUTIONAL: Alert and oriented and responds appropriately to questions. Well-appearing; well-nourished HEAD: Normocephalic EYES: Conjunctivae clear, pupils appear equal, EOM appear intact ENT: normal nose; moist mucous membranes NECK: Supple, normal ROM CARD: RRR; S1 and S2 appreciated; no murmurs, no clicks, no rubs, no gallops RESP: Normal chest excursion without splinting or tachypnea;  breath sounds clear and equal bilaterally; no wheezes, no rhonchi, no rales, no hypoxia or respiratory distress, speaking full sentences ABD/GI: Normal bowel sounds; non-distended; soft, non-tender, no rebound, no guarding, no peritoneal signs, no hepatosplenomegaly BACK: The back appears normal EXT: Normal ROM in all joints; no deformity noted, no edema; no cyanosis SKIN: Normal color for age and race; warm; no rash on exposed skin NEURO: Moves all extremities equally PSYCH: The patient's mood and manner are appropriate.  ____________________________________________   LABS (all labs ordered are listed, but only abnormal results are displayed)  Labs Reviewed  BASIC METABOLIC PANEL - Abnormal; Notable for the following components:      Result Value   Potassium 3.4 (*)    Glucose, Bld 131 (*)    All other components within normal limits  URINALYSIS, COMPLETE (UACMP) WITH MICROSCOPIC - Abnormal; Notable for the following components:   Color, Urine STRAW (*)    APPearance CLEAR (*)    Hgb urine dipstick SMALL (*)    All other components within normal limits  CBC  TROPONIN I (HIGH SENSITIVITY)  TROPONIN I (HIGH SENSITIVITY)   ____________________________________________  EKG   EKG Interpretation  Date/Time:  Sunday August 03 2020 00:20:29 EDT Ventricular Rate:  75 PR Interval:  162 QRS Duration: 96 QT Interval:  394 QTC Calculation: 439 R Axis:   37 Text Interpretation: Normal sinus rhythm Nonspecific ST abnormality Abnormal ECG No significant change since last tracing Confirmed by Pryor Curia [phone]) on 08/03/2020 3:42:30 AM        ____________________________________________  RADIOLOGY Jessie Foot Corde Antonini, personally viewed and evaluated these images (plain radiographs) as part of my medical decision making, as well as reviewing the written report by the radiologist.  ED MD interpretation: Chest x-ray clear.  Head CT shows no acute intracranial abnormality.  Official  radiology report(s): DG Chest 2 View  Result Date: 08/03/2020 CLINICAL DATA:  Hypertension EXAM: CHEST - 2 VIEW COMPARISON:  Chest x-ray 02/24/2017 FINDINGS: The heart size and mediastinal contours are within normal limits. No focal consolidation. No pulmonary edema. No pleural effusion. No pneumothorax. No acute osseous abnormality. IMPRESSION: No active cardiopulmonary  disease. Electronically Signed   By: Iven Finn M.D.   On: 08/03/2020 01:14   CT Head Wo Contrast  Result Date: 08/03/2020 CLINICAL DATA:  Transient ischemic attack. PT arrived via POV with reports of elevated BP. Pt states she has been "feeling funny" for the past several days EXAM: CT HEAD WITHOUT CONTRAST TECHNIQUE: Contiguous axial images were obtained from the base of the skull through the vertex without intravenous contrast. COMPARISON:  None. FINDINGS: Brain: Cerebral ventricle sizes are concordant with the degree of cerebral volume loss. No evidence of large-territorial acute infarction. No parenchymal hemorrhage. No mass lesion. No extra-axial collection. No mass effect or midline shift. No hydrocephalus. Basilar cisterns are patent. Vascular: No hyperdense vessel. Atherosclerotic calcifications are present within the cavernous internal carotid intervertebral arteries. Skull: No acute fracture or focal lesion. Sinuses/Orbits: Paranasal sinuses and mastoid air cells are clear. The orbits are unremarkable. Other: None. IMPRESSION: No acute intracranial abnormality. Electronically Signed   By: Iven Finn M.D.   On: 08/03/2020 00:58    ____________________________________________   PROCEDURES  Procedure(s) performed (including Critical Care):  Procedures    ____________________________________________   INITIAL IMPRESSION / ASSESSMENT AND PLAN / ED COURSE  As part of my medical decision making, I reviewed the following data within the [facility] notes reviewed and incorporated, Labs  reviewed , EKG interpreted , Old EKG reviewed, Old chart reviewed, Radiograph reviewed , and Notes from prior ED visits         Patient here with concerns for hypertension.  Was having some shortness of breath with exertion over the past couple weeks but none currently.  Also had a brief episode of numbness of the upper lip on the right side that has now resolved.  No other numbness or weakness.  Also states her head felt "funny" but denies any headache.  Work-up reassuring today including normal labs.  Urine does show some hematuria but no proteinuria.  Troponin x2 negative.  EKG nonischemic.  Chest x-ray clear.  Head CT shows no acute abnormality.  Currently her blood pressure is 160/84.  I have advised her to continue her blood pressure medications as prescribed and to follow-up closely with her primary care doctor.  We discussed that it is normal for her blood pressure to continue to rise if she is checking it repeatedly and I suspect that anxiety was likely also causing her blood pressure to go up as well.  She agrees.  Recommended checking blood pressure just 1-2 times a day and keeping a log of this information should she can presented to her primary care doctor to see if any of her blood pressure medications need to be adjusted.  Discussed that I do not feel adjusting blood pressure medication from the ER would be appropriate at this time.  She is comfortable with this plan.  We discussed return precautions.  Doubt ACS, PE, dissection, CVA, intracranial hemorrhage.  No signs of endorgan damage today.  Will discharge home.   At this time, I do not feel there is any life-threatening condition present. I have reviewed, interpreted and discussed all results (EKG, imaging, lab, urine as appropriate) and exam findings with patient/family. I have reviewed nursing notes and appropriate previous records.  I feel the patient is safe to be discharged home without further emergent workup and can continue workup  as an outpatient as needed. Discussed usual and customary return precautions. Patient/family verbalize understanding and are comfortable with this plan.  Outpatient follow-up has  been provided as needed. All questions have been answered.  ____________________________________________   FINAL CLINICAL IMPRESSION(S) / ED DIAGNOSES  Final diagnoses:  None     ED Discharge Orders     None       *Please note:  Teliah Barbieri was evaluated in Emergency Department on 08/03/2020 for the symptoms described in the history of present illness. She was evaluated in the context of the global COVID-19 pandemic, which necessitated consideration that the patient might be at risk for infection with the SARS-CoV-2 virus that causes COVID-19. Institutional protocols and algorithms that pertain to the evaluation of patients at risk for COVID-19 are in a state of rapid change based on information released by regulatory bodies including the CDC and federal and state organizations. These policies and algorithms were followed during the patient's care in the ED.  Some ED evaluations and interventions may be delayed as a result of limited staffing during and the pandemic.*   Note:  This document was prepared using Dragon voice recognition software and may include unintentional dictation errors.    Thoms Barthelemy, Delice Bison, DO 08/03/20 [phone]

## 2020-08-07 ENCOUNTER — Other Ambulatory Visit: Payer: Self-pay

## 2020-08-07 ENCOUNTER — Telehealth: Payer: Self-pay

## 2020-08-07 ENCOUNTER — Ambulatory Visit
Admission: RE | Admit: 2020-08-07 | Discharge: 2020-08-07 | Disposition: A | Discharge status: Home / Self Care | Payer: Medicare PPO | Source: Ambulatory Visit | Attending: Family Medicine | Admitting: Family Medicine

## 2020-08-07 VITALS — BP 146/83 | HR 75 | Temp 98.6°F | Resp 17

## 2020-08-07 DIAGNOSIS — I10 Essential (primary) hypertension: Secondary | ICD-10-CM | POA: Diagnosis not present

## 2020-08-07 NOTE — Telephone Encounter (Signed)
Aware, will watch for correspondence  

## 2020-08-07 NOTE — Telephone Encounter (Signed)
Pt seen [facility] ED on 08/03/20; pt said she has not felt good since was at ED. Pt said she has constant popping or cracking sound in head and that usually indicates elevated BP.08/07/20 BP at 8 AM 127/83. At 11 AM BP 155/87 and now BP 150/80 P 76 a few mins ago P 42. Pt has no hx of irreg heart beat;  pt said she cannot feel irreg heart beat but BP and pulse is "all over the place". Pt is not sleeping well; goes to sleep for 2 hrs and wakes  up goes to sleep and wakes up in  2 hrs. Pt is not resting. Pt said her father passed 06/2020 and has been stressed due to his death. Pt does not have H/A,dizziness, CP,SOB or vision changes. Pt has not missed taking amlodipine benazepril 5-20 mg once daily and HCTZ 50 once daily. Pt took metoprolol succinate 25 mg last night. Pt has no numbness or weakness in arms or legs. Pt wants to be seen and no available appts at [facility]. I scheduled appt at [facility] 08/07/20 at 3pm. UC & ED precautions given and pt voiced understanding; pt will keep appt with Dr Glori Bickers on [place]. Sending note to Dr Glori Bickers and Rexene Agent CMA is aware.

## 2020-08-07 NOTE — Discharge Instructions (Addendum)
Your blood pressure is elevated today at 146/83.  Please have this rechecked by your primary care provider in 1-2 weeks.

## 2020-08-07 NOTE — ED Provider Notes (Signed)
[name]    CSN: [id number] Arrival date & time: 08/07/20  1449      History   Chief Complaint Chief Complaint  Patient presents with   Hypertension   APPT 1500    HPI Rebecca [name] is a 65 y.o. female.  Patient presents with 1 week history of elevated blood pressure.  She reports a "popping sound" in her head when her blood pressure is elevated.  She is taking her blood pressure medications as directed.  She denies numbness, weakness, facial droop, confusion, chest pain, shortness of breath, edema, or other symptoms.  Patient was seen at [facility] ED on 08/03/2020; diagnosed with hypertension; her work-up at that time was negative and she was discharged without change to her medications.  She contacted her PCP today with concern for elevated blood pressure and was instructed to come here.  She has an appointment with Dr. Glori Bickers on 08/11/2020.  Medical history includes hypertension, prediabetes, obesity.  The history is provided by the patient and medical records.   Past Medical History:  Diagnosis Date   Allergy    Cataract    beginnings of    Glaucoma    Heart murmur    noted on PCP exam 11/23/2012; "does not appear urgent"   Hemorrhoids    Hypertension    under control with meds., has been on med. > 20 yr.   Radius fracture 11/22/2012   left, comminuted    Patient Active Problem List   Diagnosis Date Noted   Breast pain 07/22/2020   Medicare annual wellness visit, initial 12/03/2019   Insomnia 07/23/2019   Heart rate problem 05/29/2019   Medicare welcome exam 11/27/2018   Estrogen deficiency 11/27/2018   Encounter for pre-employment examination 11/02/2016   Fungal nail infection 04/27/2016   Sinusitis, chronic 03/12/2015   Hyperlipidemia 10/30/2013   Stress reaction 03/07/2013   History of radius fracture 11/23/2012   Undiagnosed cardiac murmurs 11/23/2012   Obesity (BMI 30.0-34.9) 09/14/2011   Family history of colon cancer requiring screening  colonoscopy 09/14/2011   Routine general medical examination at a health care facility 09/05/2011   Mixed incontinence urge and stress 04/14/2011   Hemorrhoids, internal, with bleeding 10/12/2010   External hemorrhoids 11/04/2009   ALLERGIC RHINITIS 08/13/2009   HYPOKALEMIA 04/28/2007   Prediabetes 11/15/2006   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   BUNIONS, RIGHT FOOT 07/19/2006   HSV 07/06/2006   Essential hypertension 07/06/2006   URINARY INCONTINENCE, MIXED 07/06/2006    Past Surgical History:  Procedure Laterality Date   ABDOMINAL HYSTERECTOMY  06/02/2000   partial   COLONOSCOPY  03/2006   COLONOSCOPY WITH PROPOFOL  11/15/2011   ENDOMETRIAL ABLATION     OPEN REDUCTION INTERNAL FIXATION (ORIF) DISTAL RADIAL FRACTURE Left 11/29/2012   Procedure: OPEN REDUCTION INTERNAL FIXATION (ORIF) LEFT  DISTAL RADIAL FRACTURE;  Surgeon: Wynonia Sours, MD;  Location: [place];  Service: Orthopedics;  Laterality: Left;   PARTIAL HYSTERECTOMY      OB History   No obstetric history on file.      Home Medications    Prior to Admission medications   Medication Sig Start Date End Date Taking? Authorizing Provider  amLODipine-benazepril (LOTREL) 5-20 MG capsule TAKE 1 CAPSULE BY MOUTH EVERY DAY 04/28/20  Yes Tower, Wynelle Fanny, MD  hydrochlorothiazide (HYDRODIURIL) 50 MG tablet Take 1 tablet (50 mg total) by mouth daily. 12/03/19  Yes Tower, Wynelle Fanny, MD  metoprolol succinate (TOPROL-XL) 25 MG 24 hr tablet Take 1 tablet (  25 mg total) by mouth daily. Patient taking differently: Take 25 mg by mouth daily as needed. 07/23/19  Yes Tower, Wynelle Fanny, MD  fluticasone (FLONASE) 50 MCG/ACT nasal spray Place 2 sprays into both nostrils daily as needed for allergies or rhinitis. 11/25/17   Tower, Wynelle Fanny, MD  Guaifenesin [facility] MAXIMUM STRENGTH) 1200 MG TB12 Take 1 tablet (1,200 mg total) by mouth every 12 (twelve) hours as needed. 03/24/17   Ivar Drape D, PA  hydrocortisone (ANUCORT-HC) 25  MG suppository UNWRAP AND INSERT ONE SUPPOSITORY RECTALLY EVERY NIGHT AT BEDTIME AS NEEDED FOR HEMORRHOIDS 10/10/15   Pleas Koch, NP  hydrocortisone (ANUSOL-HC) 2.5 % rectal cream Apply to affected area on hemorrhoids once daily as needed 10/10/15   Pleas Koch, NP  hydrOXYzine (ATARAX/VISTARIL) 25 MG tablet Take 1-2 tablets (25-50 mg total) by mouth at bedtime as needed. 07/14/19   Tasia Catchings, Amy V, PA-C  hyoscyamine (LEVSIN SL) 0.125 MG SL tablet Place 1 tablet (0.125 mg total) under the tongue every 4 (four) hours as needed (rectal spasm). 11/23/16   Pyrtle, Lajuan Lines, MD  latanoprost (XALATAN) 0.005 % ophthalmic solution Place 1 drop into both eyes.  06/27/13   [provider]  meloxicam (MOBIC) 15 MG tablet Take 1 tablet (15 mg total) by mouth daily as needed for pain. With food for wrist and hand pain 04/12/17   Tower, Wynelle Fanny, MD  Multiple Vitamin (MULTIVITAMIN) tablet Take 1 tablet by mouth daily.      [provider]  Omega-3 Fatty Acids (FISH OIL CONCENTRATE PO) Take 1 capsule by mouth daily.     [provider]  potassium chloride (KLOR-CON) 10 MEQ tablet TAKE 1 TABLET BY MOUTH EVERY DAY 12/25/19   Tower, Wynelle Fanny, MD    Family History Family History  Problem Relation Age of Onset   Hypertension Father    Coronary artery disease Father        with CABG   Dementia Father    Hypertension Mother    Colon cancer Mother    Diabetes Mother    Dementia Mother    Breast cancer Maternal Aunt    Diabetes Maternal Aunt    Colon cancer Maternal Uncle    Colon cancer Paternal Uncle    Colon polyps Sister    Esophageal cancer Neg Hx    Rectal cancer Neg Hx    Stomach cancer Neg Hx     Social History Social History   Tobacco Use   Smoking status: Never   Smokeless tobacco: Never  Substance Use Topics   Alcohol use: No   Drug use: No     Allergies   Amoxicillin, Spironolactone, Augmentin [amoxicillin-pot clavulanate], and Shrimp [shellfish  allergy]   Review of Systems Review of Systems  Constitutional:  Negative for chills and fever.  Respiratory:  Negative for cough and shortness of breath.   Cardiovascular:  Negative for chest pain and palpitations.  Gastrointestinal:  Negative for abdominal pain and vomiting.  Skin:  Negative for color change and rash.  Neurological:  Negative for dizziness, seizures, syncope, facial asymmetry, speech difficulty, weakness, light-headedness, numbness and headaches.  All other systems reviewed and are negative.   Physical Exam Triage Vital Signs ED Triage Vitals  Enc Vitals Group     BP      Pulse      Resp      Temp      Temp src      SpO2  Weight      Height      Head Circumference      Peak Flow      Pain Score      Pain Loc      Pain Edu?      Excl. in [place]?    No data found.  Updated Vital Signs BP (!) 146/83 (BP Location: Right Arm)   Pulse 75   Temp 98.6 F (37 C) (Oral)   Resp 17   SpO2 97%   Visual Acuity Right Eye Distance:   Left Eye Distance:   Bilateral Distance:    Right Eye Near:   Left Eye Near:    Bilateral Near:     Physical Exam Vitals and nursing note reviewed.  Constitutional:      General: She is not in acute distress.    Appearance: She is well-developed. She is not ill-appearing.  HENT:     Head: Normocephalic and atraumatic.     Right Ear: Tympanic membrane normal.     Left Ear: Tympanic membrane normal.     Nose: Nose normal.     Mouth/Throat:     Mouth: Mucous membranes are moist.     Pharynx: Oropharynx is clear.  Eyes:     Conjunctiva/sclera: Conjunctivae normal.  Cardiovascular:     Rate and Rhythm: Normal rate and regular rhythm.     Heart sounds: Normal heart sounds.  Pulmonary:     Effort: Pulmonary effort is normal. No respiratory distress.     Breath sounds: Normal breath sounds.  Abdominal:     Palpations: Abdomen is soft.     Tenderness: There is no abdominal tenderness.  Musculoskeletal:     Cervical  back: Neck supple.     Right lower leg: No edema.     Left lower leg: No edema.  Skin:    General: Skin is warm and dry.  Neurological:     General: No focal deficit present.     Mental Status: She is alert and oriented to person, place, and time.     Sensory: No sensory deficit.     Motor: No weakness.     Gait: Gait normal.  Psychiatric:        Mood and Affect: Mood normal.        Behavior: Behavior normal.     UC Treatments / Results  Labs (all labs ordered are listed, but only abnormal results are displayed) Labs Reviewed - No data to display  EKG   Radiology No results found.  Procedures Procedures (including critical care time)  Medications Ordered in UC Medications - No data to display  Initial Impression / Assessment and Plan / UC Course  I have reviewed the triage vital signs and the nursing notes.  Pertinent labs & imaging results that were available during my care of the patient were reviewed by me and considered in my medical decision making (see chart for details).   Elevated blood pressure with known HTN.  Blood pressure 146/83 today.  She is well-appearing and her exam is reassuring.  Instructed her to continue her current blood pressure medications.  Education provided on managing hypertension.  Patient instructed to schedule an appointment with her PCP; she reports having an appointment on Monday, 08/11/2020.  She agrees to plan of care.   Final Clinical Impressions(s) / UC Diagnoses   Final diagnoses:  Elevated blood pressure reading in office with diagnosis of hypertension     Discharge Instructions  Your blood pressure is elevated today at 146/83.  Please have this rechecked by your primary care provider in 1-2 weeks.          ED Prescriptions   None    PDMP not reviewed this encounter.   Sharion Balloon, NP 08/07/20 [phone]

## 2020-08-07 NOTE — ED Triage Notes (Signed)
Patient c/o Hypertension x 5 days.   Patient endorses difficulty sleeping at night.   Patient reports a BP of 127/83, 143/89, and 150/88 at home today.   Patient endorses " I'm hearing a little popping sound in my head".   Patient denies SOB, Chest Pain, or Dizziness.   Patient was seen at [facility] ED on 08/03/20 and reports having "normal results".   Patient has taken Amlodipine, Hydrochlorothiazide, and Metoprolol.

## 2020-08-11 ENCOUNTER — Ambulatory Visit: Payer: Medicare PPO | Admitting: Family Medicine

## 2020-08-11 ENCOUNTER — Encounter: Payer: Self-pay | Admitting: Family Medicine

## 2020-08-11 ENCOUNTER — Other Ambulatory Visit: Payer: Self-pay

## 2020-08-11 VITALS — BP 136/78 | HR 76 | Temp 97.8°F | Ht 66.25 in | Wt 221.4 lb

## 2020-08-11 DIAGNOSIS — E876 Hypokalemia: Secondary | ICD-10-CM

## 2020-08-11 DIAGNOSIS — F430 Acute stress reaction: Secondary | ICD-10-CM

## 2020-08-11 DIAGNOSIS — I10 Essential (primary) hypertension: Secondary | ICD-10-CM | POA: Diagnosis not present

## 2020-08-11 MED ORDER — POTASSIUM CHLORIDE ER 10 MEQ PO TBCR: 20.0000 meq | EXTENDED_RELEASE_TABLET | Freq: Every day | ORAL | 3 refills | Status: DC

## 2020-08-11 NOTE — Progress Notes (Signed)
Subjective:    Patient ID: Rebecca [name], female    DOB: [birth year], 65 y.o.   MRN: [id number]  This visit occurred during the SARS-CoV-2 public health emergency.  Safety protocols were in place, including screening questions prior to the visit, additional usage of staff PPE, and extensive cleaning of exam room while observing appropriate contact time as indicated for disinfecting solutions.   HPI Pt presents for f/u of ER visit for elevated BP  Wt Readings from Last 3 Encounters:  08/11/20 221 lb 6 oz (100.4 kg)  08/03/20 224 lb 6 oz (101.8 kg)  07/22/20 224 lb 6 oz (101.8 kg)   35.46 kg/m  She presented to ER on 8/4 for a 1 week history of elevated bp Noted a popping sound in her head that she associates with inc bp  No stroke symptoms No edema or cp  Of note she was also seen on 7/31 with bp of 160/84 Anxiety may play a role  Ekg was reassuring as well as labs   Bp was 146/83 at presentation with pulse of 75 Inst to continue current bp medicines   Lab Results  Component Value Date   CREATININE 0.81 08/03/2020   BUN 16 08/03/2020   NA 136 08/03/2020   K 3.4 (L) 08/03/2020   CL 100 08/03/2020   CO2 27 08/03/2020   Lab Results  Component Value Date   WBC 4.4 08/03/2020   HGB 13.7 08/03/2020   HCT 38.9 08/03/2020   MCV 90.0 08/03/2020   PLT 307 08/03/2020   DG Chest 2 View  Result Date: 08/03/2020 CLINICAL DATA:  Hypertension EXAM: CHEST - 2 VIEW COMPARISON:  Chest x-ray 02/24/2017 FINDINGS: The heart size and mediastinal contours are within normal limits. No focal consolidation. No pulmonary edema. No pleural effusion. No pneumothorax. No acute osseous abnormality. IMPRESSION: No active cardiopulmonary disease. Electronically Signed   By: Iven Finn M.D.   On: 08/03/2020 01:14   CT Head Wo Contrast  Result Date: 08/03/2020 CLINICAL DATA:  Transient ischemic attack. PT arrived via POV with reports of elevated BP. Pt states she has been "feeling funny" for the  past several days EXAM: CT HEAD WITHOUT CONTRAST TECHNIQUE: Contiguous axial images were obtained from the base of the skull through the vertex without intravenous contrast. COMPARISON:  None. FINDINGS: Brain: Cerebral ventricle sizes are concordant with the degree of cerebral volume loss. No evidence of large-territorial acute infarction. No parenchymal hemorrhage. No mass lesion. No extra-axial collection. No mass effect or midline shift. No hydrocephalus. Basilar cisterns are patent. Vascular: No hyperdense vessel. Atherosclerotic calcifications are present within the cavernous internal carotid intervertebral arteries. Skull: No acute fracture or focal lesion. Sinuses/Orbits: Paranasal sinuses and mastoid air cells are clear. The orbits are unremarkable. Other: None. IMPRESSION: No acute intracranial abnormality. Electronically Signed   By: Iven Finn M.D.   On: 08/03/2020 00:58    Here today   BP Readings from Last 3 Encounters:  08/11/20 136/78  08/07/20 (!) 146/83  08/03/20 139/74   Pulse Readings from Last 3 Encounters:  08/11/20 76  08/07/20 75  08/03/20 (!) 56   Taking : Amlodipine-benazepril 5-20 mg daily  Hctz 50 mg daily  Metoprolol xl 25 mg daily (for this and pulse)   Overall feeling better  Unsure what triggered her pressure to go up  Tried a different brand of fish oil - stopping it helped   Takes meloxicam as needed   Walks 4 mi per day  Some stress-caring  for her parent's estate   Patient Active Problem List   Diagnosis Date Noted   Breast pain 07/22/2020   Medicare annual wellness visit, initial 12/03/2019   Insomnia 07/23/2019   Heart rate problem 05/29/2019   Medicare welcome exam 11/27/2018   Estrogen deficiency 11/27/2018   Encounter for pre-employment examination 11/02/2016   Fungal nail infection 04/27/2016   Sinusitis, chronic 03/12/2015   Hyperlipidemia 10/30/2013   Stress reaction 03/07/2013   History of radius fracture 11/23/2012    Undiagnosed cardiac murmurs 11/23/2012   Obesity (BMI 30.0-34.9) 09/14/2011   Family history of colon cancer requiring screening colonoscopy 09/14/2011   Routine general medical examination at a health care facility 09/05/2011   Mixed incontinence urge and stress 04/14/2011   Hemorrhoids, internal, with bleeding 10/12/2010   External hemorrhoids 11/04/2009   ALLERGIC RHINITIS 08/13/2009   HYPOKALEMIA 04/28/2007   Prediabetes 11/15/2006   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 > 20 yr.   Radius fracture 11/22/2012   left, comminuted   Past Surgical History:  Procedure Laterality Date   ABDOMINAL HYSTERECTOMY  06/02/2000   partial   COLONOSCOPY  03/2006   COLONOSCOPY WITH PROPOFOL  11/15/2011   ENDOMETRIAL ABLATION     OPEN REDUCTION INTERNAL FIXATION (ORIF) DISTAL RADIAL FRACTURE Left 11/29/2012   Procedure: OPEN REDUCTION INTERNAL FIXATION (ORIF) LEFT  DISTAL RADIAL FRACTURE;  Surgeon: Wynonia Sours, MD;  Location: [place];  Service: Orthopedics;  Laterality: Left;   PARTIAL HYSTERECTOMY     Social History   Tobacco Use   Smoking status: Never   Smokeless tobacco: Never  Substance Use Topics   Alcohol use: No   Drug use: No   Family History  Problem Relation Age of Onset   Hypertension Father    Coronary artery disease Father        with CABG   Dementia Father    Hypertension Mother    Colon cancer Mother    Diabetes Mother    Dementia Mother    Breast cancer Maternal Aunt    Diabetes Maternal Aunt    Colon cancer Maternal Uncle    Colon cancer Paternal Uncle    Colon polyps Sister     Esophageal cancer Neg Hx    Rectal cancer Neg Hx    Stomach cancer Neg Hx    Allergies  Allergen Reactions   Amoxicillin Hives and Swelling   Spironolactone Diarrhea   Augmentin [Amoxicillin-Pot Clavulanate] Diarrhea    Tore stomach up   Shrimp [Shellfish Allergy] Hives and Swelling   Current Outpatient Medications on File Prior to Visit  Medication Sig Dispense Refill   amLODipine-benazepril (LOTREL) 5-20 MG capsule TAKE 1 CAPSULE BY MOUTH EVERY DAY 90 capsule 1   fluticasone (FLONASE) 50 MCG/ACT nasal spray Place 2 sprays into both nostrils daily as needed for allergies or rhinitis. 48 g 3   Guaifenesin (MUCINEX MAXIMUM STRENGTH) 1200 MG TB12 Take 1 tablet (1,200 mg total) by mouth every 12 (twelve) hours as needed. 14 tablet 1   hydrochlorothiazide (HYDRODIURIL)  50 MG tablet Take 1 tablet (50 mg total) by mouth daily. 90 tablet 3   hydrocortisone (ANUCORT-HC) 25 MG suppository UNWRAP AND INSERT ONE SUPPOSITORY RECTALLY EVERY NIGHT AT BEDTIME AS NEEDED FOR HEMORRHOIDS 12 suppository 0   hydrocortisone (ANUSOL-HC) 2.5 % rectal cream Apply to affected area on hemorrhoids once daily as needed 30 g 0   hydrOXYzine (ATARAX/VISTARIL) 25 MG tablet Take 1-2 tablets (25-50 mg total) by mouth at bedtime as needed. 12 tablet 0   hyoscyamine (LEVSIN SL) 0.125 MG SL tablet Place 1 tablet (0.125 mg total) under the tongue every 4 (four) hours as needed (rectal spasm). 30 tablet 1   latanoprost (XALATAN) 0.005 % ophthalmic solution Place 1 drop into both eyes.      meloxicam (MOBIC) 15 MG tablet Take 1 tablet (15 mg total) by mouth daily as needed for pain. With food for wrist and hand pain 30 tablet 1   metoprolol succinate (TOPROL-XL) 25 MG 24 hr tablet Take 1 tablet (25 mg total) by mouth daily. (Patient taking differently: Take 25 mg by mouth daily as needed.) 90 tablet 3   Multiple Vitamin (MULTIVITAMIN) tablet Take 1 tablet by mouth daily.       Omega-3 Fatty Acids (FISH OIL CONCENTRATE PO)  Take 1 capsule by mouth daily.      No current facility-administered medications on file prior to visit.    Review of Systems  Constitutional:  Positive for fatigue. Negative for activity change, appetite change, fever and unexpected weight change.  HENT:  Negative for congestion, ear pain, rhinorrhea, sinus pressure and sore throat.   Eyes:  Negative for pain, redness and visual disturbance.  Respiratory:  Negative for cough, shortness of breath and wheezing.   Cardiovascular:  Negative for chest pain and palpitations.  Gastrointestinal:  Negative for abdominal pain, blood in stool, constipation and diarrhea.  Endocrine: Negative for polydipsia and polyuria.  Genitourinary:  Negative for dysuria, frequency and urgency.  Musculoskeletal:  Negative for arthralgias, back pain and myalgias.  Skin:  Negative for pallor and rash.  Allergic/Immunologic: Negative for environmental allergies.  Neurological:  Negative for dizziness, syncope and headaches.       No longer has sound in head  Hematological:  Negative for adenopathy. Does not bruise/bleed easily.  Psychiatric/Behavioral:  Negative for decreased concentration and dysphoric mood. The patient is not nervous/anxious.        Stressors      Objective:   Physical Exam Constitutional:      General: She is not in acute distress.    Appearance: Normal appearance. She is well-developed. She is obese. She is not ill-appearing or diaphoretic.  HENT:     Head: Normocephalic and atraumatic.  Eyes:     Conjunctiva/sclera: Conjunctivae normal.     Pupils: Pupils are equal, round, and reactive to light.  Neck:     Thyroid: No thyromegaly.     Vascular: No carotid bruit or JVD.  Cardiovascular:     Rate and Rhythm: Normal rate and regular rhythm.     Heart sounds: Normal heart sounds.    No gallop.  Pulmonary:     Effort: Pulmonary effort is normal. No respiratory distress.     Breath sounds: Normal breath sounds. No wheezing or rales.   Abdominal:     General: Abdomen is flat. There is no abdominal bruit.  Musculoskeletal:     Cervical back: Normal range of motion and neck supple.     Right lower leg: No edema.  Left lower leg: No edema.  Lymphadenopathy:     Cervical: No cervical adenopathy.  Skin:    General: Skin is warm and dry.     Coloration: Skin is not pale.     Findings: No rash.  Neurological:     Mental Status: She is alert.     Coordination: Coordination normal.     Deep Tendon Reflexes: Reflexes are normal and symmetric. Reflexes normal.  Psychiatric:        Mood and Affect: Mood normal.        Cognition and Memory: Cognition and memory normal.     Comments: Good mood  Pleasant           Assessment & Plan:   Problem List Items Addressed This Visit       Cardiovascular and Mediastinum   Essential hypertension - Primary    bp was elevated for a while and pt had 2 trips to ER She has symptoms (head)when it inc  Reviewed hospital records, lab results and studies in detail  BP: 136/78  Improved now Reassuring imaging /ekg and labs -rev with pt today  Great health habits-walking Has option to inc amlodipine-benazepril 5-20 to 2 pills daily with close monitoring  Continue hctz 50 mg daily (will inc K) Continue metoprolol xl 25 mg daily  Could inc that in future as well         Other   Hypokalemia    Lab Results  Component Value Date   K 3.4 (L) 08/03/2020  Will inc suppl to 20 meq daily  Re check in 1-2 wk Order done      Relevant Medications   potassium chloride (KLOR-CON) 10 MEQ tablet   Other Relevant Orders   Basic metabolic panel   Stress reaction    Stress may inc bp a bit  Handling parent's estate Things are getting better now

## 2020-08-11 NOTE — Assessment & Plan Note (Signed)
bp was elevated for a while and pt had 2 trips to ER She has symptoms (head)when it inc  Reviewed hospital records, lab results and studies in detail  BP: 136/78  Improved now Reassuring imaging /ekg and labs -rev with pt today  Great health habits-walking Has option to inc amlodipine-benazepril 5-20 to 2 pills daily with close monitoring  Continue hctz 50 mg daily (will inc K) Continue metoprolol xl 25 mg daily  Could inc that in future as well

## 2020-08-11 NOTE — Assessment & Plan Note (Signed)
Stress may inc bp a bit  Handling parent's estate Things are getting better now

## 2020-08-11 NOTE — Patient Instructions (Signed)
If you feel like blood pressure is high- you can try taking 2 of the amlodipine-benazepril  Let me know how it goes If you stick with it we will increase the px  Increase your potassium to 2 pills daily   Eat healthy and get some exercise  Drink water   Let's re check potassium in 1-2 weeks

## 2020-08-11 NOTE — Assessment & Plan Note (Signed)
Lab Results  Component Value Date   K 3.4 (L) 08/03/2020   Will inc suppl to 20 meq daily  Re check in 1-2 wk Order done

## 2020-08-25 ENCOUNTER — Other Ambulatory Visit (INDEPENDENT_AMBULATORY_CARE_PROVIDER_SITE_OTHER): Payer: Medicare PPO

## 2020-08-25 ENCOUNTER — Other Ambulatory Visit: Payer: Self-pay

## 2020-08-25 DIAGNOSIS — E876 Hypokalemia: Secondary | ICD-10-CM | POA: Diagnosis not present

## 2020-08-25 LAB — BASIC METABOLIC PANEL
BUN: 16 mg/dL (ref 6–23)
CO2: 28 mEq/L (ref 19–32)
Calcium: 9.3 mg/dL (ref 8.4–10.5)
Chloride: 102 mEq/L (ref 96–112)
Creatinine, Ser: 0.82 mg/dL (ref 0.40–1.20)
GFR: 74.07 mL/min (ref >60.00)
Glucose, Bld: 106 mg/dL — ABNORMAL HIGH (ref 70–99)
Potassium: 3.5 mEq/L (ref 3.5–5.1)
Sodium: 139 mEq/L (ref 135–145)

## 2020-08-26 ENCOUNTER — Ambulatory Visit: Admission: RE | Admit: 2020-08-26 | Payer: Medicare PPO | Source: Ambulatory Visit

## 2020-08-26 ENCOUNTER — Ambulatory Visit: Payer: Medicare PPO

## 2020-08-26 ENCOUNTER — Ambulatory Visit
Admission: RE | Admit: 2020-08-26 | Discharge: 2020-08-26 | Disposition: A | Discharge status: Home / Self Care | Payer: Medicare PPO | Source: Ambulatory Visit | Attending: Family Medicine | Admitting: Family Medicine

## 2020-08-26 DIAGNOSIS — N644 Mastodynia: Secondary | ICD-10-CM

## 2020-08-26 DIAGNOSIS — R922 Inconclusive mammogram: Secondary | ICD-10-CM | POA: Diagnosis not present

## 2020-10-14 ENCOUNTER — Other Ambulatory Visit: Payer: Self-pay

## 2020-10-14 ENCOUNTER — Ambulatory Visit: Payer: Medicare PPO | Admitting: Family Medicine

## 2020-10-14 ENCOUNTER — Encounter: Payer: Self-pay | Admitting: Family Medicine

## 2020-10-14 VITALS — BP 132/80 | HR 83 | Temp 98.2°F | Ht 66.25 in | Wt 217.2 lb

## 2020-10-14 DIAGNOSIS — G8929 Other chronic pain: Secondary | ICD-10-CM | POA: Diagnosis not present

## 2020-10-14 DIAGNOSIS — E7800 Pure hypercholesterolemia, unspecified: Secondary | ICD-10-CM

## 2020-10-14 DIAGNOSIS — I10 Essential (primary) hypertension: Secondary | ICD-10-CM

## 2020-10-14 DIAGNOSIS — R7303 Prediabetes: Secondary | ICD-10-CM

## 2020-10-14 DIAGNOSIS — R5382 Chronic fatigue, unspecified: Secondary | ICD-10-CM

## 2020-10-14 DIAGNOSIS — R350 Frequency of micturition: Secondary | ICD-10-CM | POA: Diagnosis not present

## 2020-10-14 DIAGNOSIS — M545 Low back pain, unspecified: Secondary | ICD-10-CM

## 2020-10-14 LAB — COMPREHENSIVE METABOLIC PANEL
ALT: 14 U/L (ref 0–35)
AST: 19 U/L (ref 0–37)
Albumin: 4.7 g/dL (ref 3.5–5.2)
Alkaline Phosphatase: 57 U/L (ref 39–117)
BUN: 16 mg/dL (ref 6–23)
CO2: 28 mEq/L (ref 19–32)
Calcium: 9.7 mg/dL (ref 8.4–10.5)
Chloride: 97 mEq/L (ref 96–112)
Creatinine, Ser: 0.88 mg/dL (ref 0.40–1.20)
GFR: 67.98 mL/min (ref >60.00)
Glucose, Bld: 118 mg/dL — ABNORMAL HIGH (ref 70–99)
Potassium: 3.6 mEq/L (ref 3.5–5.1)
Sodium: 136 mEq/L (ref 135–145)
Total Bilirubin: 0.8 mg/dL (ref 0.2–1.2)
Total Protein: 7.8 g/dL (ref 6.0–8.3)

## 2020-10-14 LAB — CBC WITH DIFFERENTIAL/PLATELET
Basophils Absolute: 0 10*3/uL (ref 0.0–0.1)
Basophils Relative: 0.6 % (ref 0.0–3.0)
Eosinophils Absolute: 0 10*3/uL (ref 0.0–0.7)
Eosinophils Relative: 0.6 % (ref 0.0–5.0)
HCT: 42.1 % (ref 36.0–46.0)
Hemoglobin: 14.1 g/dL (ref 12.0–15.0)
Lymphocytes Relative: 20.2 % (ref 12.0–46.0)
Lymphs Abs: 1 10*3/uL (ref 0.7–4.0)
MCHC: 33.5 g/dL (ref 30.0–36.0)
MCV: 91.8 fl (ref 78.0–100.0)
Monocytes Absolute: 0.4 10*3/uL (ref 0.1–1.0)
Monocytes Relative: 8.3 % (ref 3.0–12.0)
Neutro Abs: 3.4 10*3/uL (ref 1.4–7.7)
Neutrophils Relative %: 70.3 % (ref 43.0–77.0)
Platelets: 302 10*3/uL (ref 150.0–400.0)
RBC: 4.58 Mil/uL (ref 3.87–5.11)
RDW: 13.2 % (ref 11.5–15.5)
WBC: 4.8 10*3/uL (ref 4.0–10.5)

## 2020-10-14 LAB — LIPID PANEL
Cholesterol: 247 mg/dL — ABNORMAL HIGH (ref 0–200)
HDL: 67.3 mg/dL (ref >39.00)
LDL Cholesterol: 165 mg/dL — ABNORMAL HIGH (ref 0–99)
NonHDL: 180.18
Total CHOL/HDL Ratio: 4
Triglycerides: 75 mg/dL (ref 0.0–149.0)
VLDL: 15 mg/dL (ref 0.0–40.0)

## 2020-10-14 LAB — POC URINALSYSI DIPSTICK (AUTOMATED)
Bilirubin, UA: NEGATIVE
Blood, UA: NEGATIVE
Glucose, UA: NEGATIVE
Ketones, UA: 5
Leukocytes, UA: NEGATIVE
Nitrite, UA: NEGATIVE
Protein, UA: NEGATIVE
Spec Grav, UA: 1.025 (ref 1.010–1.025)
Urobilinogen, UA: 0.2 E.U./dL
pH, UA: 6 (ref 5.0–8.0)

## 2020-10-14 LAB — VITAMIN B12: Vitamin B-12: 391 pg/mL (ref 211–911)

## 2020-10-14 LAB — HEMOGLOBIN A1C: Hgb A1c MFr Bld: 6.2 % (ref 4.6–6.5)

## 2020-10-14 LAB — TSH: TSH: 1.28 u[IU]/mL (ref 0.35–5.50)

## 2020-10-14 NOTE — Assessment & Plan Note (Signed)
ua is clear  Possible overactive bladder  Will discuss at f/u  Encouraged a good water intake

## 2020-10-14 NOTE — Assessment & Plan Note (Signed)
bp much better on 2nd check after sitting bp in fair control at this time  BP Readings from Last 1 Encounters:  10/14/20 132/80   No changes needed Most recent labs reviewed  Disc lifstyle change with low sodium diet and exercise  Plan to continue amlodipine-benazepril 5-20 mg daily (takes 2 if diastolic is over 530) hctz 50 mg daily  Metoprolol xl 25 mg daily

## 2020-10-14 NOTE — Assessment & Plan Note (Signed)
A1C ordered disc imp of low glycemic diet and wt loss to prevent DM2  Doing great with exercise and some wt loss

## 2020-10-14 NOTE — Assessment & Plan Note (Signed)
Possible due to stress/grief and busy schedule  Good self care and exercise  Labs drawn today incl B12 level

## 2020-10-14 NOTE — Assessment & Plan Note (Signed)
Worse on extension and some lateral bend Suspect strain Handout given for rehab  Reviewed Disc further at f/u  ua is clear

## 2020-10-14 NOTE — Progress Notes (Signed)
Subjective:    Patient ID: Rebecca [name], female    DOB: 05/15/1955, 65 y.o.   MRN: [id number]  This visit occurred during the SARS-CoV-2 public health emergency.  Safety protocols were in place, including screening questions prior to the visit, additional usage of staff PPE, and extensive cleaning of exam room while observing appropriate contact time as indicated for disinfecting solutions.   HPI Pt presents for c/o low energy/fatigue and elevated bp   Wt Readings from Last 3 Encounters:  10/14/20 217 lb 4 oz (98.5 kg)  08/11/20 221 lb 6 oz (100.4 kg)  08/03/20 224 lb 6 oz (101.8 kg)   34.80 kg/m  Really tired lately  Off and on   Stress reaction-could be related to that  Her daughter got Otho Bellows does not like the spouse Martin Majestic to the wedding and did not want to go  Helped her move   Also working on [employer] house to sell- going pretty good   Walking for exercise -4 mi per day   Last week felt lousy/back hurt also (low back)  Urinating a lot- ? Uti  No dysuria  Some urgency  No blood in urine   On and off    Some intermittent pain-? Bladder area  Dull feeling-worse lately   BP Readings from Last 3 Encounters:  10/14/20 132/80  08/11/20 136/78  08/07/20 (!) 146/83    HTN  bp is up today No cp or palpitations or headaches or edema  No side effects to medicines   Amlodipine--benazepril 5-20 mg  (some days takes 2 instead of 1 if diastolic is over 638)  Hctz 50 mg daily Metoprolol xl 25 mg daily   Bp at home  130s/70s to 154/94     Pulse Readings from Last 3 Encounters:  10/14/20 83  08/11/20 76  08/07/20 75   Lab Results  Component Value Date   CREATININE 0.82 08/25/2020   BUN 16 08/25/2020   NA 139 08/25/2020   K 3.5 08/25/2020   CL 102 08/25/2020   CO2 28 08/25/2020    Lab Results  Component Value Date   TSH 1.82 11/26/2019   Lab Results  Component Value Date   WBC 4.4 08/03/2020   HGB 13.7 08/03/2020   HCT  38.9 08/03/2020   MCV 90.0 08/03/2020   PLT 307 08/03/2020   Ua today: Results for orders placed or performed in visit on 10/14/20  POCT Urinalysis Dipstick (Automated)  Result Value Ref Range   Color, UA Yellow    Clarity, UA Clear    Glucose, UA Negative Negative   Bilirubin, UA Negative    Ketones, UA 5 mg/dL    Spec Grav, UA 1.025 1.010 - 1.025   Blood, UA Negative    pH, UA 6.0 5.0 - 8.0   Protein, UA Negative Negative   Urobilinogen, UA 0.2 0.2 or 1.0 E.U./dL   Nitrite, UA Negative    Leukocytes, UA Negative Negative   Patient Active Problem List   Diagnosis Date Noted  . Urine frequency 10/14/2020  . Low back pain 10/14/2020  . Breast pain 07/22/2020  . Medicare annual wellness visit, initial 12/03/2019  . Insomnia 07/23/2019  . Heart rate problem 05/29/2019  . Medicare welcome exam 11/27/2018  . Estrogen deficiency 11/27/2018  . Encounter for pre-employment examination 11/02/2016  . Fungal nail infection 04/27/2016  . Sinusitis, chronic 03/12/2015  . Fatigue 05/24/2014  . Hyperlipidemia 10/30/2013  . Stress reaction 03/07/2013  . History of radius  fracture 11/23/2012  . Undiagnosed cardiac murmurs 11/23/2012  . Obesity (BMI 30.0-34.9) 09/14/2011  . Family history of colon cancer requiring screening colonoscopy 09/14/2011  . Routine general medical examination at a health care facility 09/05/2011  . Mixed incontinence urge and stress 04/14/2011  . Hemorrhoids, internal, with bleeding 10/12/2010  . External hemorrhoids 11/04/2009  . ALLERGIC RHINITIS 08/13/2009  . Hypokalemia 04/28/2007  . Prediabetes 11/15/2006  . 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  . BUNIONS, RIGHT FOOT 07/19/2006  . 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 Essential hypertension 07/06/2006  . URINARY INCONTINENCE, MIXED 07/06/2006   Past Medical History:  Diagnosis Date  . Allergy   . Cataract    beginnings of   . Glaucoma   . Heart murmur    noted on PCP exam 11/23/2012; "does not appear urgent"  .  Hemorrhoids   . Hypertension    under control with meds., has been on med. > 20 yr.  . Radius fracture 11/22/2012   left, comminuted   Past Surgical History:  Procedure Laterality Date  . ABDOMINAL HYSTERECTOMY  06/02/2000   partial  . COLONOSCOPY  03/2006  . COLONOSCOPY WITH PROPOFOL  11/15/2011  . ENDOMETRIAL ABLATION    . OPEN REDUCTION INTERNAL FIXATION (ORIF) DISTAL RADIAL FRACTURE Left 11/29/2012   Procedure: OPEN REDUCTION INTERNAL FIXATION (ORIF) LEFT  DISTAL RADIAL FRACTURE;  Surgeon: Wynonia Sours, MD;  Location: [facility];  Service: Orthopedics;  Laterality: Left;  . PARTIAL HYSTERECTOMY     Social History   Tobacco Use  . Smoking status: Never  . Smokeless tobacco: Never  Substance Use Topics  . Alcohol use: No  . Drug use: No   Family History  Problem Relation Age of Onset  . Hypertension Father   . Coronary artery disease Father        with CABG  . Dementia Father   . Hypertension Mother   . Colon cancer Mother   . Diabetes Mother   . Dementia Mother   . Breast cancer Maternal Aunt   . Diabetes Maternal Aunt   . Colon cancer Maternal Uncle   . Colon cancer Paternal Uncle   . Colon polyps Sister   . Esophageal cancer Neg Hx   . Rectal cancer Neg Hx   . Stomach cancer Neg Hx    Allergies  Allergen Reactions  . Amoxicillin Hives and Swelling  . Spironolactone Diarrhea  . Augmentin [Amoxicillin-Pot Clavulanate] Diarrhea    Tore stomach up  . Shrimp [Shellfish Allergy] Hives and Swelling   Current Outpatient Medications on File Prior to Visit  Medication Sig Dispense Refill  . amLODipine-benazepril (LOTREL) 5-20 MG capsule TAKE 1 CAPSULE BY MOUTH EVERY DAY 90 capsule 1  . fluticasone (FLONASE) 50 MCG/ACT nasal spray Place 2 sprays into both nostrils daily as needed for allergies or rhinitis. 48 g 3  . hydrochlorothiazide (HYDRODIURIL) 50 MG tablet Take 1 tablet (50 mg total) by mouth daily. 90 tablet 3  . hydrocortisone (ANUCORT-HC) 25  MG suppository UNWRAP AND INSERT ONE SUPPOSITORY RECTALLY EVERY NIGHT AT BEDTIME AS NEEDED FOR HEMORRHOIDS 12 suppository 0  . hydrocortisone (ANUSOL-HC) 2.5 % rectal cream Apply to affected area on hemorrhoids once daily as needed 30 g 0  . hydrOXYzine (ATARAX/VISTARIL) 25 MG tablet Take 1-2 tablets (25-50 mg total) by mouth at bedtime as needed. 12 tablet 0  . latanoprost (XALATAN) 0.005 % ophthalmic solution Place 1 drop into both eyes.     . metoprolol succinate (TOPROL-XL) 25 MG 24  hr tablet Take 1 tablet (25 mg total) by mouth daily. (Patient taking differently: Take 25 mg by mouth daily as needed.) 90 tablet 3  . Multiple Vitamin (MULTIVITAMIN) tablet Take 1 tablet by mouth daily.      . Omega-3 Fatty Acids (FISH OIL CONCENTRATE PO) Take 1 capsule by mouth daily.     . potassium chloride (KLOR-CON) 10 MEQ tablet Take 2 tablets (20 mEq total) by mouth daily. 180 tablet 3   No current facility-administered medications on file prior to visit.    Review of Systems  Constitutional:  Positive for fatigue. Negative for activity change, appetite change, diaphoresis, fever and unexpected weight change.  HENT:  Negative for congestion, ear pain, rhinorrhea, sinus pressure and sore throat.   Eyes:  Negative for pain, redness and visual disturbance.  Respiratory:  Negative for cough, shortness of breath and wheezing.   Cardiovascular:  Negative for chest pain and palpitations.  Gastrointestinal:  Negative for abdominal pain, blood in stool, constipation and diarrhea.  Endocrine: Negative for polydipsia and polyuria.  Genitourinary:  Positive for frequency and urgency. Negative for hematuria and pelvic pain.  Musculoskeletal:  Positive for back pain. Negative for arthralgias and myalgias.  Skin:  Negative for pallor and rash.  Allergic/Immunologic: Negative for environmental allergies.  Neurological:  Negative for dizziness, syncope and headaches.  Hematological:  Negative for adenopathy. Does not  bruise/bleed easily.  Psychiatric/Behavioral:  Negative for decreased concentration and dysphoric mood. The patient is not nervous/anxious.        Grief Tearful occasionally      Objective:   Physical Exam Constitutional:      General: She is not in acute distress.    Appearance: Normal appearance. She is well-developed. She is obese. She is not ill-appearing or diaphoretic.  HENT:     Head: Normocephalic and atraumatic.     Mouth/Throat:     Mouth: Mucous membranes are moist.  Eyes:     General: No scleral icterus.    Conjunctiva/sclera: Conjunctivae normal.     Pupils: Pupils are equal, round, and reactive to light.  Neck:     Thyroid: No thyromegaly.     Vascular: No carotid bruit or JVD.  Cardiovascular:     Rate and Rhythm: Normal rate and regular rhythm.     Pulses: Normal pulses.     Heart sounds: Normal heart sounds.    No gallop.  Pulmonary:     Effort: Pulmonary effort is normal. No respiratory distress.     Breath sounds: Normal breath sounds. No wheezing or rales.  Abdominal:     General: Bowel sounds are normal. There is no distension or abdominal bruit.     Palpations: Abdomen is soft. There is no mass.     Tenderness: There is no abdominal tenderness. There is no right CVA tenderness or left CVA tenderness.     Comments: No suprapubic tenderness or fullness    Musculoskeletal:     Cervical back: Normal range of motion and neck supple. No tenderness.     Right lower leg: No edema.     Left lower leg: No edema.     Comments: Some lumbar muscle tightness No bony tenderness Nl rom  Back pain reproduced with ext of spine Some discomfort with lateral bend  Lymphadenopathy:     Cervical: No cervical adenopathy.  Skin:    General: Skin is warm and dry.     Coloration: Skin is not jaundiced or pale.  Findings: No bruising, erythema or rash.  Neurological:     Mental Status: She is alert.     Cranial Nerves: No cranial nerve deficit.     Motor: No  weakness.     Coordination: Coordination normal.     Gait: Gait normal.     Deep Tendon Reflexes: Reflexes are normal and symmetric. Reflexes normal.  Psychiatric:        Mood and Affect: Mood normal.        Cognition and Memory: Cognition and memory normal.     Comments: Occ tearful when discussing grief           Assessment & Plan:   Problem List Items Addressed This Visit       Cardiovascular and Mediastinum   Essential hypertension - Primary    bp much better on 2nd check after sitting bp in fair control at this time  BP Readings from Last 1 Encounters:  10/14/20 132/80   No changes needed Most recent labs reviewed  Disc lifstyle change with low sodium diet and exercise  Plan to continue amlodipine-benazepril 5-20 mg daily (takes 2 if diastolic is over 314) hctz 50 mg daily  Metoprolol xl 25 mg daily        Relevant Orders   CBC with Differential/Platelet   Comprehensive metabolic panel   TSH   Lipid panel     Other   Prediabetes    A1C ordered disc imp of low glycemic diet and wt loss to prevent DM2  Doing great with exercise and some wt loss      Relevant Orders   Hemoglobin A1c   Hyperlipidemia    Disc goals for lipids and reasons to control them Rev last labs with pt Rev low sat fat diet in detail Labs ordered      Relevant Orders   Lipid panel   Fatigue    Possible due to stress/grief and busy schedule  Good self care and exercise  Labs drawn today incl B12 level      Relevant Orders   CBC with Differential/Platelet   Comprehensive metabolic panel   TSH   Vitamin B12   Urine frequency    ua is clear  Possible overactive bladder  Will discuss at f/u  Encouraged a good water intake      Relevant Orders   POCT Urinalysis Dipstick (Automated) (Completed)   Low back pain    Worse on extension and some lateral bend Suspect strain Handout given for rehab  Reviewed Disc further at f/u  ua is clear

## 2020-10-14 NOTE — Patient Instructions (Addendum)
Check blood pressure when you are relaxed  If anxious or upset it will be falsely high   BP is better on 2nd check  Take care of yourself   Try the back stretches/exercises   Urinalysis today Labs today    Let's do a pelvic exam next month if you have groin discomfort

## 2020-11-24 ENCOUNTER — Encounter: Payer: Self-pay | Admitting: Family Medicine

## 2020-11-24 ENCOUNTER — Ambulatory Visit: Payer: Medicare PPO | Admitting: Family Medicine

## 2020-11-24 ENCOUNTER — Other Ambulatory Visit: Payer: Self-pay

## 2020-11-24 VITALS — BP 136/70 | HR 65 | Temp 97.5°F | Ht 66.75 in | Wt 217.1 lb

## 2020-11-24 DIAGNOSIS — I10 Essential (primary) hypertension: Secondary | ICD-10-CM

## 2020-11-24 DIAGNOSIS — R7303 Prediabetes: Secondary | ICD-10-CM | POA: Diagnosis not present

## 2020-11-24 DIAGNOSIS — Z0000 Encounter for general adult medical examination without abnormal findings: Secondary | ICD-10-CM

## 2020-11-24 DIAGNOSIS — E7800 Pure hypercholesterolemia, unspecified: Secondary | ICD-10-CM

## 2020-11-24 DIAGNOSIS — Z23 Encounter for immunization: Secondary | ICD-10-CM

## 2020-11-24 DIAGNOSIS — E669 Obesity, unspecified: Secondary | ICD-10-CM

## 2020-11-24 DIAGNOSIS — K648 Other hemorrhoids: Secondary | ICD-10-CM | POA: Diagnosis not present

## 2020-11-24 MED ORDER — AMLODIPINE BESY-BENAZEPRIL HCL 5-20 MG PO CAPS: 1.0000 | ORAL_CAPSULE | Freq: Every day | ORAL | 3 refills | Status: DC

## 2020-11-24 MED ORDER — HYDROCHLOROTHIAZIDE 50 MG PO TABS: 50.0000 mg | ORAL_TABLET | Freq: Every day | ORAL | 3 refills | Status: DC

## 2020-11-24 NOTE — Progress Notes (Signed)
Subjective:    Patient ID: Rebecca [name], female    DOB: 02-11-1955, 65 y.o.   MRN: [id number]  This visit occurred during the SARS-CoV-2 public health emergency.  Safety protocols were in place, including screening questions prior to the visit, additional usage of staff PPE, and extensive cleaning of exam room while observing appropriate contact time as indicated for disinfecting solutions.   HPI Pt presents for amw and health mt visit  I have personally reviewed the Medicare Annual Wellness questionnaire and have noted 1. The patient's medical and social history 2. Their use of alcohol, tobacco or illicit drugs 3. Their current medications and supplements 4. The patient's functional ability including ADL's, fall risks, home safety risks and hearing or visual             impairment. 5. Diet and physical activities 6. Evidence for depression or mood disorders  The patients weight, height, BMI have been recorded in the chart and visual acuity is per eye clinic.  I have made referrals, counseling and provided education to the patient based review of the above and I have provided the pt with a written personalized care plan for preventive services. Reviewed and updated provider list, see scanned forms.  See scanned forms.  Routine anticipatory guidance given to patient.  See health maintenance. Colon cancer screening  colonoscopy 11/18 with 5 y recall  Has a h/o hemorrhoids/occ bleeding  Feels pressure from them occ  Mother and uncles had colon cancer and sister had polyps  Breast cancer screening  mammogram 8/22 Self breast exam: no lumps Flu vaccine: will do today Tetanus vaccine  Tdap 2014 Pneumovax: up to date  Covid immunized Zoster vaccine-will check on coverage Dexa 12/20 -in nl range [place] none Fractures -none  Supplements-started vitamin D  Exercise : indoor equip. Working on this   Advance directive: needs to update this  Cognitive function addressed- see scanned  forms- and if abnormal then additional documentation follows.  Is very aware  Sharp No problems at all  She cares for a dementia pt   Doing better overall  Getting everything done with loss of parents  Trying to take care of herself   Was caring for a friend with dementia-had to place her  Helped sell her house also  Care team [facility]   Vision/hearing  Hearing Screening   500Hz  1000Hz  2000Hz  4000Hz   Right ear 40 40 40 40  Left ear 40 40 40 40  Vision Screening - Comments:: [facility]ly eye exam with [facility]. Next eye Exam in January 2023   Phq  Depression screen [facility] 2/9 11/24/2020 11/27/2018 11/25/2017 09/14/2016 05/24/2014  Decreased Interest 0 0 0 0 2  Down, Depressed, Hopeless 0 0 0 0 0  PHQ - 2 Score 0 0 0 0 2  Altered sleeping - - - - 2  Tired, decreased energy - - - - 3  Change in appetite - - - - 1  Feeling bad or failure about yourself  - - - - 1  Trouble concentrating - - - - 0  Moving slowly or fidgety/restless - - - - 0  Suicidal thoughts - - - - 0  PHQ-9 Score - - - - 9  Difficult doing work/chores - - - - Not difficult at all     ADLs: no problems Functionality: great   PMH and SH reviewed  Meds, vitals, and allergies reviewed.   ROS: See HPI.  Otherwise negative.    Weight  Wt Readings from Last 3 Encounters:  11/24/20 217 lb 2 oz (98.5 kg)  10/14/20 217 lb 4 oz (98.5 kg)  08/11/20 221 lb 6 oz (100.4 kg)   34.26 kg/m Trying to cut back -eating in general Some healthier foods  HTN bp is stable today  No cp or palpitations or headaches or edema  No side effects to medicines  BP Readings from Last 3 Encounters:  11/24/20 136/70  10/14/20 132/80  08/11/20 136/78     Pulse Readings from Last 3 Encounters:  11/24/20 65  10/14/20 83  08/11/20 76    Amlodipine -benazepril 5-20 daily Toprol xl 25 mg prn daily Hctz 50 mg daily  Lab Results  Component Value Date   CREATININE 0.88 10/14/2020   BUN 16 10/14/2020    NA 136 10/14/2020   K 3.6 10/14/2020   CL 97 10/14/2020   CO2 28 10/14/2020   Lab Results  Component Value Date   ALT 14 10/14/2020   AST 19 10/14/2020   ALKPHOS 57 10/14/2020   BILITOT 0.8 10/14/2020    Hyperlipidemia Lab Results  Component Value Date   CHOL 247 (H) 10/14/2020   CHOL 190 11/26/2019   CHOL 207 (H) 11/20/2018   Lab Results  Component Value Date   HDL 67.30 10/14/2020   HDL 54.40 11/26/2019   HDL 54.60 11/20/2018   Lab Results  Component Value Date   LDLCALC 165 (H) 10/14/2020   LDLCALC 121 (H) 11/26/2019   LDLCALC 127 (H) 11/20/2018   Lab Results  Component Value Date   TRIG 75.0 10/14/2020   TRIG 74.0 11/26/2019   TRIG 126.0 11/20/2018   Lab Results  Component Value Date   CHOLHDL 4 10/14/2020   CHOLHDL 3 11/26/2019   CHOLHDL 4 11/20/2018   Lab Results  Component Value Date   LDLDIRECT 107.3 08/11/2009   LDL is up  Her favorite foods are full of cholesterol  Just stopped heavy breakfast since getting these # Too much fried chicken and fries     Prediabetes Lab Results  Component Value Date   HGBA1C 6.2 10/14/2020    Patient Active Problem List   Diagnosis Date Noted   Medicare annual wellness visit, subsequent 11/24/2020   Low back pain 10/14/2020   Breast pain 07/22/2020   Medicare annual wellness visit, initial 12/03/2019   Insomnia 07/23/2019   Heart rate problem 05/29/2019   Medicare welcome exam 11/27/2018   Estrogen deficiency 11/27/2018   Encounter for pre-employment examination 11/02/2016   Fungal nail infection 04/27/2016   Sinusitis, chronic 03/12/2015   Fatigue 05/24/2014   Hyperlipidemia 10/30/2013   History of radius fracture 11/23/2012   Undiagnosed cardiac murmurs 11/23/2012   Obesity (BMI 30.0-34.9) 09/14/2011   Family history of colon cancer requiring screening colonoscopy 09/14/2011   Routine general medical examination at a health care facility 09/05/2011   Mixed incontinence urge and stress  04/14/2011   Hemorrhoids, internal, with bleeding 10/12/2010   External hemorrhoids 11/04/2009   ALLERGIC RHINITIS 08/13/2009   Hypokalemia 04/28/2007   Prediabetes 11/15/2006   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 > 20 yr.   Radius fracture 11/22/2012   left, comminuted   Past Surgical History:  Procedure Laterality Date   ABDOMINAL HYSTERECTOMY  06/02/2000   partial   COLONOSCOPY  03/2006   COLONOSCOPY WITH PROPOFOL  11/15/2011   ENDOMETRIAL ABLATION     OPEN REDUCTION INTERNAL FIXATION (ORIF) DISTAL RADIAL FRACTURE Left 11/29/2012   Procedure: OPEN REDUCTION INTERNAL FIXATION (ORIF) LEFT  DISTAL RADIAL FRACTURE;  Surgeon: Wynonia Sours, MD;  Location: [place];  Service: Orthopedics;  Laterality: Left;   PARTIAL HYSTERECTOMY     Social History   Tobacco Use   Smoking status: Never   Smokeless tobacco: Never  Substance Use Topics   Alcohol use: No   Drug use: No   Family History  Problem Relation Age of Onset   Hypertension Father    Coronary artery disease Father        with CABG   Dementia Father    Hypertension Mother    Colon cancer Mother    Diabetes Mother    Dementia Mother    Breast cancer Maternal Aunt    Diabetes Maternal Aunt    Colon cancer Maternal Uncle    Colon cancer Paternal Uncle    Colon polyps Sister    Esophageal cancer Neg Hx    Rectal cancer Neg Hx    Stomach cancer Neg Hx    Allergies  Allergen Reactions   Amoxicillin Hives and Swelling   Spironolactone Diarrhea   Augmentin [Amoxicillin-Pot Clavulanate] Diarrhea    Tore stomach up   Shrimp [Shellfish  Allergy] Hives and Swelling   Current Outpatient Medications on File Prior to Visit  Medication Sig Dispense Refill   cholecalciferol (VITAMIN D3) 25 MCG (1000 UNIT) tablet Take 1,000 Units by mouth daily.     Cyanocobalamin (B-12) 5000 MCG CAPS Take 1 capsule by mouth every other day.     fluticasone (FLONASE) 50 MCG/ACT nasal spray Place 2 sprays into both nostrils daily as needed for allergies or rhinitis. 48 g 3   hydrocortisone (ANUCORT-HC) 25 MG suppository UNWRAP AND INSERT ONE SUPPOSITORY RECTALLY EVERY NIGHT AT BEDTIME AS NEEDED FOR HEMORRHOIDS 12 suppository 0   hydrocortisone (ANUSOL-HC) 2.5 % rectal cream Apply to affected area on hemorrhoids once daily as needed 30 g 0   hydrOXYzine (ATARAX/VISTARIL) 25 MG tablet Take 1-2 tablets (25-50 mg total) by mouth at bedtime as needed. 12 tablet 0   latanoprost (XALATAN) 0.005 % ophthalmic solution Place 1 drop into both eyes.      metoprolol succinate (TOPROL-XL) 25 MG 24 hr tablet Take 1 tablet (25 mg total) by mouth daily. (Patient taking differently: Take 25 mg by mouth daily as needed.) 90 tablet 3   Multiple Vitamin (MULTIVITAMIN) tablet Take 1 tablet by mouth daily.       Omega-3 Fatty Acids (FISH OIL CONCENTRATE PO) Take 1 capsule by mouth daily.      potassium chloride (KLOR-CON) 10 MEQ tablet Take 2 tablets (20 mEq total) by mouth daily. 180 tablet 3   No current facility-administered medications on file prior to visit.     Review of Systems  Constitutional:  Positive for fatigue. Negative for activity change, appetite change, fever and unexpected weight change.  HENT:  Negative for congestion, ear pain, rhinorrhea, sinus pressure and sore throat.   Eyes:  Negative for pain, redness and visual disturbance.  Respiratory:  Negative for cough, shortness of breath and wheezing.  Cardiovascular:  Negative for chest pain and palpitations.  Gastrointestinal:  Negative for abdominal pain, blood in stool, constipation and diarrhea.   Endocrine: Negative for polydipsia and polyuria.  Genitourinary:  Negative for dysuria, frequency and urgency.  Musculoskeletal:  Negative for arthralgias, back pain and myalgias.  Skin:  Negative for pallor and rash.  Allergic/Immunologic: Negative for environmental allergies.  Neurological:  Negative for dizziness, syncope and headaches.  Hematological:  Negative for adenopathy. Does not bruise/bleed easily.  Psychiatric/Behavioral:  Negative for decreased concentration and dysphoric mood. The patient is not nervous/anxious.       Objective:   Physical Exam Constitutional:      General: She is not in acute distress.    Appearance: Normal appearance. She is well-developed. She is obese. She is not ill-appearing or diaphoretic.  HENT:     Head: Normocephalic and atraumatic.     Right Ear: Tympanic membrane, ear canal and external ear normal.     Left Ear: Tympanic membrane, ear canal and external ear normal.     Nose: Nose normal. No congestion.     Mouth/Throat:     Mouth: Mucous membranes are moist.     Pharynx: Oropharynx is clear. No posterior oropharyngeal erythema.  Eyes:     General: No scleral icterus.    Extraocular Movements: Extraocular movements intact.     Conjunctiva/sclera: Conjunctivae normal.     Pupils: Pupils are equal, round, and reactive to light.  Neck:     Thyroid: No thyromegaly.     Vascular: No carotid bruit or JVD.  Cardiovascular:     Rate and Rhythm: Normal rate and regular rhythm.     Pulses: Normal pulses.     Heart sounds: Normal heart sounds.    No gallop.  Pulmonary:     Effort: Pulmonary effort is normal. No respiratory distress.     Breath sounds: Normal breath sounds. No wheezing.     Comments: Good air exch Chest:     Chest wall: No tenderness.  Abdominal:     General: Bowel sounds are normal. There is no distension or abdominal bruit.     Palpations: Abdomen is soft. There is no mass.     Tenderness: There is no abdominal  tenderness.     Hernia: No hernia is present.  Genitourinary:    Comments: Breast exam: No mass, nodules, thickening, tenderness, bulging, retraction, inflamation, nipple discharge or skin changes noted.  No axillary or clavicular LA.     Musculoskeletal:        General: No tenderness. Normal range of motion.     Cervical back: Normal range of motion and neck supple. No rigidity. No muscular tenderness.     Right lower leg: No edema.     Left lower leg: No edema.     Comments: No acute joint changes  Lymphadenopathy:     Cervical: No cervical adenopathy.  Skin:    General: Skin is warm and dry.     Coloration: Skin is not pale.     Findings: No erythema or rash.     Comments: Few skin tags  Neurological:     Mental Status: She is alert. Mental status is at baseline.     Cranial Nerves: No cranial nerve deficit.     Motor: No abnormal muscle tone.     Coordination: Coordination normal.     Gait: Gait normal.     Deep Tendon Reflexes: Reflexes are normal and symmetric. Reflexes normal.  Psychiatric:  Mood and Affect: Mood normal.        Cognition and Memory: Cognition and memory normal.          Assessment & Plan:   Problem List Items Addressed This Visit       Cardiovascular and Mediastinum   Essential hypertension    bp in fair control at this time  BP Readings from Last 1 Encounters:  11/24/20 136/70  No changes needed Most recent labs reviewed  Disc lifstyle change with low sodium diet and exercise  Plan to continue  Amlodipine -benazepril 5-20 daily Toprol xl 25 mg prn daily Hctz 50 mg daily      Relevant Medications   hydrochlorothiazide (HYDRODIURIL) 50 MG tablet   amLODipine-benazepril (LOTREL) 5-20 MG capsule   Hemorrhoids, internal, with bleeding    More rectal fullness and discomfort lately  Desires GI visit Ref done  fam h/o colon cancer and pt due for colonoscopy in one more year      Relevant Medications   hydrochlorothiazide  (HYDRODIURIL) 50 MG tablet   amLODipine-benazepril (LOTREL) 5-20 MG capsule   Other Relevant Orders   Ambulatory referral to Gastroenterology     Other   Prediabetes    Lab Results  Component Value Date   HGBA1C 6.2 10/14/2020  disc imp of low glycemic diet and wt loss to prevent DM2       Routine general medical examination at a health care facility    Reviewed health habits including diet and exercise and skin cancer prevention Reviewed appropriate screening tests for age  Also reviewed health mt list, fam hx and immunization status , as well as social and family history   See HPI Labs reviewed  cologuard ordered /colonoscopy due in a year Mammogram utd  Discussed shingrix, plans to check on coverage dexa nl 2020, no falls or fx, takes vit D Given materials to work on [employer] directive  Nl hearing screen  utd vision /eye exam  phq score is 0 No problems with ADLs -good functionality      Obesity (BMI 30.0-34.9)    Discussed how this problem influences overall health and the risks it imposes  Reviewed plan for weight loss with lower calorie diet (via better food choices and also portion control or program like weight watchers) and exercise building up to or more than 30 minutes 5 days per week including some aerobic activity         Hyperlipidemia    Disc goals for lipids and reasons to control them Rev last labs with pt Rev low sat fat diet in detail  LDL up significantly to 165 (from 120s) Bad diet  Made plan for change/handouts given  Will re check this in 3 mo      Relevant Medications   hydrochlorothiazide (HYDRODIURIL) 50 MG tablet   amLODipine-benazepril (LOTREL) 5-20 MG capsule   Medicare annual wellness visit, subsequent - Primary    Reviewed health habits including diet and exercise and skin cancer prevention Reviewed appropriate screening tests for age  Also reviewed health mt list, fam hx and immunization status , as well as social and family history    See HPI Labs reviewed  cologuard ordered /colonoscopy due in a year Mammogram utd  Discussed shingrix, plans to check on coverage dexa nl 2020, no falls or fx, takes vit D Given materials to work on [employer] directive  Nl hearing screen  utd vision /eye exam  phq score is 0 No problems with ADLs -good functionality  Other Visit Diagnoses     Need for influenza vaccination       Relevant Orders   Flu Vaccine QUAD High Dose(Fluad) (Completed)

## 2020-11-24 NOTE — Assessment & Plan Note (Signed)
Discussed how this problem influences overall health and the risks it imposes  Reviewed plan for weight loss with lower calorie diet (via better food choices and also portion control or program like weight watchers) and exercise building up to or more than 30 minutes 5 days per week including some aerobic activity    

## 2020-11-24 NOTE — Patient Instructions (Addendum)
I put a GI referral in  You will get a call   If you are interested in the new shingles vaccine (Shingrix) - call your local pharmacy to check on coverage and availability  If affordable, get on a wait list at your pharmacy to get the vaccine.  For cholesterol  Avoid red meat/ fried foods/ egg yolks/ fatty breakfast meats/ butter, cheese and high fat dairy/ and shellfish  Avoid pre fried frozen foods   For diabetes prev Try to get most of your carbohydrates from produce (with the exception of white potatoes)  Eat less bread/pasta/rice/snack foods/cereals/sweets and other items from the middle of the grocery store (processed carbs)   Let's check cholesterol again in 3 months

## 2020-11-24 NOTE — Assessment & Plan Note (Signed)
Disc goals for lipids and reasons to control them Rev last labs with pt Rev low sat fat diet in detail  LDL up significantly to 165 (from 120s) Bad diet  Made plan for change/handouts given  Will re check this in 3 mo

## 2020-11-24 NOTE — Assessment & Plan Note (Signed)
Reviewed health habits including diet and exercise and skin cancer prevention Reviewed appropriate screening tests for age  Also reviewed health mt list, fam hx and immunization status , as well as social and family history   See HPI Labs reviewed  cologuard ordered /colonoscopy due in a year Mammogram utd  Discussed shingrix, plans to check on coverage dexa nl 2020, no falls or fx, takes vit D Given materials to work on [employer] directive  Nl hearing screen  utd vision /eye exam  phq score is 0 No problems with ADLs -good functionality

## 2020-11-24 NOTE — Assessment & Plan Note (Signed)
bp in fair control at this time  BP Readings from Last 1 Encounters:  11/24/20 136/70   No changes needed Most recent labs reviewed  Disc lifstyle change with low sodium diet and exercise  Plan to continue  Amlodipine -benazepril 5-20 daily Toprol xl 25 mg prn daily Hctz 50 mg daily

## 2020-11-24 NOTE — Assessment & Plan Note (Signed)
Lab Results  Component Value Date   HGBA1C 6.2 10/14/2020   disc imp of low glycemic diet and wt loss to prevent DM2

## 2020-11-24 NOTE — Assessment & Plan Note (Signed)
More rectal fullness and discomfort lately  Desires GI visit Ref done  fam h/o colon cancer and pt due for colonoscopy in one more year

## 2020-12-31 DIAGNOSIS — Z809 Family history of malignant neoplasm, unspecified: Secondary | ICD-10-CM | POA: Diagnosis not present

## 2020-12-31 DIAGNOSIS — H409 Unspecified glaucoma: Secondary | ICD-10-CM | POA: Diagnosis not present

## 2020-12-31 DIAGNOSIS — G8929 Other chronic pain: Secondary | ICD-10-CM | POA: Diagnosis not present

## 2020-12-31 DIAGNOSIS — I10 Essential (primary) hypertension: Secondary | ICD-10-CM | POA: Diagnosis not present

## 2020-12-31 DIAGNOSIS — Z91013 Allergy to seafood: Secondary | ICD-10-CM | POA: Diagnosis not present

## 2020-12-31 DIAGNOSIS — Z833 Family history of diabetes mellitus: Secondary | ICD-10-CM | POA: Diagnosis not present

## 2020-12-31 DIAGNOSIS — E669 Obesity, unspecified: Secondary | ICD-10-CM | POA: Diagnosis not present

## 2020-12-31 DIAGNOSIS — Z8249 Family history of ischemic heart disease and other diseases of the circulatory system: Secondary | ICD-10-CM | POA: Diagnosis not present

## 2021-02-04 DIAGNOSIS — H524 Presbyopia: Secondary | ICD-10-CM | POA: Diagnosis not present

## 2021-02-04 DIAGNOSIS — H401130 Primary open-angle glaucoma, bilateral, stage unspecified: Secondary | ICD-10-CM | POA: Diagnosis not present

## 2021-02-23 ENCOUNTER — Other Ambulatory Visit: Payer: Self-pay | Admitting: Family Medicine

## 2021-02-23 ENCOUNTER — Telehealth: Payer: Self-pay | Admitting: Family Medicine

## 2021-02-23 DIAGNOSIS — I10 Essential (primary) hypertension: Secondary | ICD-10-CM

## 2021-02-23 DIAGNOSIS — R7303 Prediabetes: Secondary | ICD-10-CM

## 2021-02-23 DIAGNOSIS — E7800 Pure hypercholesterolemia, unspecified: Secondary | ICD-10-CM

## 2021-02-23 DIAGNOSIS — E876 Hypokalemia: Secondary | ICD-10-CM

## 2021-02-23 NOTE — Telephone Encounter (Signed)
-----   Message from [name] sent at 02/23/2021  9:13 AM EST ----- Regarding: Lab orders for Tuesday, 2.21.23 Lab orders for a 3 month follow up appt.

## 2021-02-24 ENCOUNTER — Other Ambulatory Visit: Payer: Self-pay

## 2021-02-24 ENCOUNTER — Other Ambulatory Visit: Payer: Medicare HMO

## 2021-02-24 ENCOUNTER — Other Ambulatory Visit (INDEPENDENT_AMBULATORY_CARE_PROVIDER_SITE_OTHER): Payer: Medicare HMO

## 2021-02-24 DIAGNOSIS — E7800 Pure hypercholesterolemia, unspecified: Secondary | ICD-10-CM

## 2021-02-24 DIAGNOSIS — R7303 Prediabetes: Secondary | ICD-10-CM

## 2021-02-24 LAB — LIPID PANEL
Cholesterol: 206 mg/dL — ABNORMAL HIGH (ref 0–200)
HDL: 61.6 mg/dL (ref >39.00)
LDL Cholesterol: 127 mg/dL — ABNORMAL HIGH (ref 0–99)
NonHDL: 144.89
Total CHOL/HDL Ratio: 3
Triglycerides: 90 mg/dL (ref 0.0–149.0)
VLDL: 18 mg/dL (ref 0.0–40.0)

## 2021-02-24 LAB — HEMOGLOBIN A1C: Hgb A1c MFr Bld: 6.3 % (ref 4.6–6.5)

## 2021-03-05 DIAGNOSIS — M25531 Pain in right wrist: Secondary | ICD-10-CM | POA: Diagnosis not present

## 2021-03-06 DIAGNOSIS — Z0100 Encounter for examination of eyes and vision without abnormal findings: Secondary | ICD-10-CM | POA: Diagnosis not present

## 2021-03-11 DIAGNOSIS — L99 Other disorders of skin and subcutaneous tissue in diseases classified elsewhere: Secondary | ICD-10-CM | POA: Diagnosis not present

## 2021-03-11 DIAGNOSIS — R0981 Nasal congestion: Secondary | ICD-10-CM | POA: Diagnosis not present

## 2021-03-12 ENCOUNTER — Ambulatory Visit: Payer: Medicare HMO | Admitting: Physician Assistant

## 2021-03-23 DIAGNOSIS — Z01818 Encounter for other preprocedural examination: Secondary | ICD-10-CM | POA: Diagnosis not present

## 2021-03-23 DIAGNOSIS — H401131 Primary open-angle glaucoma, bilateral, mild stage: Secondary | ICD-10-CM | POA: Diagnosis not present

## 2021-03-23 DIAGNOSIS — H25811 Combined forms of age-related cataract, right eye: Secondary | ICD-10-CM | POA: Diagnosis not present

## 2021-04-14 DIAGNOSIS — L99 Other disorders of skin and subcutaneous tissue in diseases classified elsewhere: Secondary | ICD-10-CM | POA: Diagnosis not present

## 2021-04-14 DIAGNOSIS — R0981 Nasal congestion: Secondary | ICD-10-CM | POA: Diagnosis not present

## 2021-04-20 DIAGNOSIS — M9902 Segmental and somatic dysfunction of thoracic region: Secondary | ICD-10-CM | POA: Diagnosis not present

## 2021-04-20 DIAGNOSIS — M542 Cervicalgia: Secondary | ICD-10-CM | POA: Diagnosis not present

## 2021-04-20 DIAGNOSIS — M9903 Segmental and somatic dysfunction of lumbar region: Secondary | ICD-10-CM | POA: Diagnosis not present

## 2021-04-20 DIAGNOSIS — M9901 Segmental and somatic dysfunction of cervical region: Secondary | ICD-10-CM | POA: Diagnosis not present

## 2021-04-21 DIAGNOSIS — M542 Cervicalgia: Secondary | ICD-10-CM | POA: Diagnosis not present

## 2021-04-21 DIAGNOSIS — M9903 Segmental and somatic dysfunction of lumbar region: Secondary | ICD-10-CM | POA: Diagnosis not present

## 2021-04-21 DIAGNOSIS — M9902 Segmental and somatic dysfunction of thoracic region: Secondary | ICD-10-CM | POA: Diagnosis not present

## 2021-04-21 DIAGNOSIS — M9901 Segmental and somatic dysfunction of cervical region: Secondary | ICD-10-CM | POA: Diagnosis not present

## 2021-04-22 DIAGNOSIS — M9901 Segmental and somatic dysfunction of cervical region: Secondary | ICD-10-CM | POA: Diagnosis not present

## 2021-04-22 DIAGNOSIS — M542 Cervicalgia: Secondary | ICD-10-CM | POA: Diagnosis not present

## 2021-04-22 DIAGNOSIS — M9902 Segmental and somatic dysfunction of thoracic region: Secondary | ICD-10-CM | POA: Diagnosis not present

## 2021-04-22 DIAGNOSIS — M9903 Segmental and somatic dysfunction of lumbar region: Secondary | ICD-10-CM | POA: Diagnosis not present

## 2021-04-30 ENCOUNTER — Encounter: Payer: Self-pay | Admitting: Family Medicine

## 2021-04-30 ENCOUNTER — Ambulatory Visit (INDEPENDENT_AMBULATORY_CARE_PROVIDER_SITE_OTHER): Payer: Medicare HMO | Admitting: Family Medicine

## 2021-04-30 ENCOUNTER — Ambulatory Visit (INDEPENDENT_AMBULATORY_CARE_PROVIDER_SITE_OTHER)
Admission: RE | Admit: 2021-04-30 | Discharge: 2021-04-30 | Disposition: A | Discharge status: Home / Self Care | Payer: Medicare HMO | Source: Ambulatory Visit | Attending: Family Medicine | Admitting: Family Medicine

## 2021-04-30 VITALS — BP 136/82 | HR 62 | Temp 97.3°F | Ht 66.25 in | Wt 222.4 lb

## 2021-04-30 DIAGNOSIS — M2480 Other specific joint derangements of unspecified joint, not elsewhere classified: Secondary | ICD-10-CM

## 2021-04-30 DIAGNOSIS — R519 Headache, unspecified: Secondary | ICD-10-CM | POA: Diagnosis not present

## 2021-04-30 DIAGNOSIS — M47812 Spondylosis without myelopathy or radiculopathy, cervical region: Secondary | ICD-10-CM | POA: Diagnosis not present

## 2021-04-30 NOTE — Progress Notes (Signed)
? ?Subjective:  ? ? Patient ID: Rebecca [name], female    DOB: 1955/11/23, 66 y.o.   MRN: [id number] ? ?HPI ?Pt presents for c/o neck discomfort and headache  ? ?Wt Readings from Last 3 Encounters:  ?04/30/21 222 lb 6 oz (100.9 kg)  ?11/24/20 217 lb 2 oz (98.5 kg)  ?10/14/20 217 lb 4 oz (98.5 kg)  ? ?35.62 kg/m? ? ?Saw ENT about sinuses/normal w/u /nl CT ? ?Has snap/crackle/pop in the back of her neck  ?Hears it when she turns her head  ?No pain but neck feels tight (in the middle /not one side)  ? ?Has headaches some times -thinks this comes from her neck  ?Feels off balance at times (no vertigo) ? ?Headache is on the R side ?At times sharp/other times dull  ?Stays in back of head/behind ear  ?Does not throb  ? ?Sometimes she gets nauseated with it  ?Not sensitive to light or sound  ?Pain scale 4-5, is tolerable  ? ?Has the headache every other day  ?It does not keep her from doing anything  ? ?Headaches off/on for the whole day at most  ?Start in am and often gone by mid day  ?Not bothered when sleeping  ? ?Occ takes a sinus pill (antihistamine) -does not help  ?Has not taken tylenol  ? ?Caffeine 1-2 cups per day, coffee (in the am) - used to drink more and cut back  ? ?No stroke symptoms  ? ?Stress level has not been high  ? ?No triggers  ?Supplements may make it worse/not sure  ?Mvi, ca and D, fish oil, vit B12  ? ?Exercise-walking  ? ?BP Readings from Last 3 Encounters:  ?04/30/21 136/82  ?11/24/20 136/70  ?10/14/20 132/80  ? ?Pulse Readings from Last 3 Encounters:  ?04/30/21 62  ?11/24/20 65  ?10/14/20 83  ? ?Lab Results  ?Component Value Date  ? CREATININE 0.88 10/14/2020  ? BUN 16 10/14/2020  ? NA 136 10/14/2020  ? K 3.6 10/14/2020  ? CL 97 10/14/2020  ? CO2 28 10/14/2020  ? ? ?Patient Active Problem List  ? Diagnosis Date Noted  ? Crepitus of cervical spine 04/30/2021  ? Unilateral occipital headache 04/30/2021  ? Medicare annual wellness visit, subsequent 11/24/2020  ? Low back pain 10/14/2020  ? Breast pain  07/22/2020  ? Insomnia 07/23/2019  ? Heart rate problem 05/29/2019  ? Estrogen deficiency 11/27/2018  ? Encounter for pre-employment examination 11/02/2016  ? Fungal nail infection 04/27/2016  ? Sinusitis, chronic 03/12/2015  ? Fatigue 05/24/2014  ? Hyperlipidemia 10/30/2013  ? History of radius fracture 11/23/2012  ? Undiagnosed cardiac murmurs 11/23/2012  ? Obesity (BMI 30.0-34.9) 09/14/2011  ? Family history of colon cancer requiring screening colonoscopy 09/14/2011  ? Routine general medical examination at a health care facility 09/05/2011  ? Mixed incontinence urge and stress 04/14/2011  ? Hemorrhoids, internal, with bleeding 10/12/2010  ? External hemorrhoids 11/04/2009  ? ALLERGIC RHINITIS 08/13/2009  ? Hypokalemia 04/28/2007  ? Prediabetes 11/15/2006  ? 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  ? BUNIONS, RIGHT FOOT 07/19/2006  ? 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 Essential hypertension 07/06/2006  ? URINARY INCONTINENCE, MIXED 07/06/2006  ? ?Past Medical History:  ?Diagnosis Date  ? Allergy   ? Cataract   ? beginnings of   ? Glaucoma   ? Heart murmur   ? noted on PCP exam 11/23/2012; "does not appear urgent"  ? Hemorrhoids   ? Hypertension   ? under control with meds., has been on  med. > 20 yr.  ? Radius fracture 11/22/2012  ? left, comminuted  ? ?Past Surgical History:  ?Procedure Laterality Date  ? ABDOMINAL HYSTERECTOMY  06/02/2000  ? partial  ? COLONOSCOPY  03/2006  ? COLONOSCOPY WITH PROPOFOL  11/15/2011  ? ENDOMETRIAL ABLATION    ? OPEN REDUCTION INTERNAL FIXATION (ORIF) DISTAL RADIAL FRACTURE Left 11/29/2012  ? Procedure: OPEN REDUCTION INTERNAL FIXATION (ORIF) LEFT  DISTAL RADIAL FRACTURE;  Surgeon: Wynonia Sours, MD;  Location: [facility];  Service: Orthopedics;  Laterality: Left;  ? PARTIAL HYSTERECTOMY    ? ?Social History  ? ?Tobacco Use  ? Smoking status: Never  ? Smokeless tobacco: Never  ?Substance Use Topics  ? Alcohol use: No  ? Drug use: No  ? ?Family History  ?Problem Relation Age of Onset  ?  Hypertension Father   ? Coronary artery disease Father   ?     with CABG  ? Dementia Father   ? Hypertension Mother   ? Colon cancer Mother   ? Diabetes Mother   ? Dementia Mother   ? Breast cancer Maternal Aunt   ? Diabetes Maternal Aunt   ? Colon cancer Maternal Uncle   ? Colon cancer Paternal Uncle   ? Colon polyps Sister   ? Esophageal cancer Neg Hx   ? Rectal cancer Neg Hx   ? Stomach cancer Neg Hx   ? ?Allergies  ?Allergen Reactions  ? Amoxicillin Hives and Swelling  ? Spironolactone Diarrhea  ? Augmentin [Amoxicillin-Pot Clavulanate] Diarrhea  ?  Tore stomach up  ? Shrimp [Shellfish Allergy] Hives and Swelling  ? ?Current Outpatient Medications on File Prior to Visit  ?Medication Sig Dispense Refill  ? amLODipine-benazepril (LOTREL) 5-20 MG capsule Take 1 capsule by mouth daily. 90 capsule 3  ? cholecalciferol (VITAMIN D3) 25 MCG (1000 UNIT) tablet Take 1,000 Units by mouth daily.    ? Cyanocobalamin (B-12) 5000 MCG CAPS Take 1 capsule by mouth every other day.    ? fluticasone (FLONASE) 50 MCG/ACT nasal spray Place 2 sprays into both nostrils daily as needed for allergies or rhinitis. 48 g 3  ? hydrochlorothiazide (HYDRODIURIL) 50 MG tablet Take 1 tablet (50 mg total) by mouth daily. 90 tablet 3  ? hydrocortisone (ANUCORT-HC) 25 MG suppository UNWRAP AND INSERT ONE SUPPOSITORY RECTALLY EVERY NIGHT AT BEDTIME AS NEEDED FOR HEMORRHOIDS 12 suppository 0  ? hydrocortisone (ANUSOL-HC) 2.5 % rectal cream Apply to affected area on hemorrhoids once daily as needed 30 g 0  ? hydrOXYzine (ATARAX/VISTARIL) 25 MG tablet Take 1-2 tablets (25-50 mg total) by mouth at bedtime as needed. 12 tablet 0  ? latanoprost (XALATAN) 0.005 % ophthalmic solution Place 1 drop into both eyes.     ? metoprolol succinate (TOPROL-XL) 25 MG 24 hr tablet Take 1 tablet (25 mg total) by mouth daily. (Patient taking differently: Take 25 mg by mouth daily as needed.) 90 tablet 3  ? Multiple Vitamin (MULTIVITAMIN) tablet Take 1 tablet by mouth  daily.      ? Omega-3 Fatty Acids (FISH OIL CONCENTRATE PO) Take 1 capsule by mouth daily.     ? potassium chloride (KLOR-CON) 10 MEQ tablet TAKE 1 TABLET BY MOUTH EVERY DAY 90 tablet 2  ? ?No current facility-administered medications on file prior to visit.  ?  ?Review of Systems  ?Constitutional:  Positive for fatigue. Negative for activity change, appetite change, fever and unexpected weight change.  ?HENT:  Negative for congestion, ear pain, rhinorrhea, sinus  pressure and sore throat.   ?Eyes:  Negative for pain, redness and visual disturbance.  ?Respiratory:  Negative for cough, shortness of breath and wheezing.   ?Cardiovascular:  Negative for chest pain and palpitations.  ?Gastrointestinal:  Negative for abdominal pain, blood in stool, constipation and diarrhea.  ?Endocrine: Negative for polydipsia and polyuria.  ?Genitourinary:  Negative for dysuria, frequency and urgency.  ?Musculoskeletal:  Negative for arthralgias, back pain and myalgias.  ?     Crunching /crackling sound when she moves her neck  ?Skin:  Negative for pallor and rash.  ?Allergic/Immunologic: Negative for environmental allergies.  ?Neurological:  Positive for headaches. Negative for dizziness and syncope.  ?Hematological:  Negative for adenopathy. Does not bruise/bleed easily.  ?Psychiatric/Behavioral:  Negative for decreased concentration and dysphoric mood. The patient is not nervous/anxious.   ? ?   ?Objective:  ? Physical Exam ?Constitutional:   ?   General: She is not in acute distress. ?   Appearance: Normal appearance. She is well-developed. She is obese. She is not ill-appearing or diaphoretic.  ?HENT:  ?   Head: Normocephalic and atraumatic.  ?   Comments: No temporal tenderness ?No scalp tenderness ?   Right Ear: Tympanic membrane, ear canal and external ear normal.  ?   Left Ear: Tympanic membrane, ear canal and external ear normal.  ?   Nose:  ?   Comments: No sinus tenderness ?Eyes:  ?   Conjunctiva/sclera: Conjunctivae normal.   ?   Pupils: Pupils are equal, round, and reactive to light.  ?Neck:  ?   Thyroid: No thyromegaly.  ?   Vascular: No carotid bruit or JVD.  ?   Comments: Pt notes crunching sound when moving neck.  I cannot p

## 2021-04-30 NOTE — Assessment & Plan Note (Signed)
Pt feels and hears creptius/it is very bothersome to her  ?No pain but occ tension  ?Unsure if related to her occipital headache ? ?CS film ordered today  ?Reassuring exam ?

## 2021-04-30 NOTE — Assessment & Plan Note (Signed)
Pain at least every other day, sharp and located around L occiput and behind ear  ?Nl neuro exam  ?Recent ENT w/u was normal and bp is ok ?Some neck crepitus and tension  ? ?Given new headache with age over 8- CT of head was ordered ?ER precautions discussed  ?Adv use of ice prn , also a proper pillow with cervical support  ?Has not warranted pain medication so far ?Occipital neuralgia is in the differential  ?

## 2021-04-30 NOTE — Patient Instructions (Signed)
Neck xray today  ? ?I will place order for a CT scan ?You will get a call  ? ?Drink fluids  ?Limit caffeine  ?Make sure you have a pillow that is not too tall or short  ? ?Keep your neuro appointment  ? ? ?If you worsen let us know  ?If sudden severe headache go to the ER  ? ? ?

## 2021-05-04 ENCOUNTER — Encounter: Payer: Self-pay | Admitting: Family Medicine

## 2021-05-04 ENCOUNTER — Ambulatory Visit
Admission: RE | Admit: 2021-05-04 | Discharge: 2021-05-04 | Disposition: A | Discharge status: Home / Self Care | Payer: Medicare HMO | Source: Ambulatory Visit | Attending: Family Medicine | Admitting: Family Medicine

## 2021-05-04 ENCOUNTER — Other Ambulatory Visit: Payer: Medicare HMO

## 2021-05-04 DIAGNOSIS — R519 Headache, unspecified: Secondary | ICD-10-CM

## 2021-05-15 NOTE — Progress Notes (Signed)
? ? ? ?05/18/2021 ?Andrian Urbach ?[id number] ?1955-07-25 ? ?Referring provider: Abner Greenspan, MD ?Primary GI doctor: Dr. Hilarie Fredrickson ( previous Dr. Sharlett Iles) ? ?ASSESSMENT AND PLAN:  ? ?Hemorrhoids, internal, with bleeding ?-Sitz baths, increase fiber, increase water, need to fix toilet habits.  ?-will schedule for colonoscopy sooner with rectal bleeding and family history, due in Nov 2023.  ?-We discussed hemorrhoid banding in no improvement after colonoscopy ?- follow up for evaluation ? ?Rectal bleeding ?We discussed anal fissures and long healing process ?Sitz baths, high-fiber diet, add on benefiber.  ?Long discussion about preventing constipation discussed in detail for prevention.  ?Diltiazem2%/lidocaine 5% 3 x daily for 2 months on rectum #30 gram 1 refill ? ?Family history of colon cancer ?Patient due for colon 11/2021, with rectal bleeding and family history will schedule a few months early, patient agrees.  ?We have discussed the risks of bleeding, infection, perforation, medication reactions, and remote risk of death associated with colonoscopy. All questions were answered and the patient acknowledges these risk and wishes to proceed. ? ? ? ? ?Patient Care Team: ?Tower, Wynelle Fanny, MD as PCP - General ? ?HISTORY OF PRESENT ILLNESS: ?66 y.o. female with a past medical history of hyperlipidemia, obesity, prediabetes, hypertension, urinary stress incontinence, family history of colon cancer in maternal uncle and paternal uncle, history of hemorrhoids.   ? ?And others listed below presents for evaluation of hemorrhoids.  ?11/23/2016 colonoscopy excellent bowel prep normal mucosa, anal papula were hypertrophied no specimens collected.  Discussed of anorectal prolapse symptoms with intermittent bleeding continued could do hemorrhoidal banding recall 5 years (11/2021) ?11/15/2011 colonoscopy normal colonoscopy random biopsies for microscopic colitis negative ? ?She states if she has strenuous walking or after a BM,  can having stinging/burning and light red on pad/TP after wiping.  ?She has BM daily, no straining, soft brown, feels she has complete BM.  ?Patient denies GERD.  ?Patient denies dysphagia, nausea, vomiting, melena.  ?Patient denies change in bowel habits, constipation, diarrhea, hematochezia.  ?Denies changes in appetite, unintentional weight loss. ? ?CBC  10/14/2020  ?HGB 14.1 MCV 91.8 without evidence of anemia ?WBC 4.8 Platelets 302.0 ?10/14/2020  B12 391 ?Kidney function 10/14/2020  ?BUN 16 Cr 0.88  ?GFR >60  ?Potassium 3.6   ?LFTs 10/14/2020  ?AST 19 ALT 14 ?Alkphos 57 TBili 0.8 ?10/14/2020 TSH 1.28 ? ? ?Current Medications:  ? ? ?Current Outpatient Medications (Cardiovascular):  ?  amLODipine-benazepril (LOTREL) 5-20 MG capsule, Take 1 capsule by mouth daily. ?  hydrochlorothiazide (HYDRODIURIL) 50 MG tablet, Take 1 tablet (50 mg total) by mouth daily. ?  metoprolol succinate (TOPROL-XL) 25 MG 24 hr tablet, Take 1 tablet (25 mg total) by mouth daily. (Patient taking differently: Take 25 mg by mouth daily as needed.) ? ?Current Outpatient Medications (Respiratory):  ?  fluticasone (FLONASE) 50 MCG/ACT nasal spray, Place 2 sprays into both nostrils daily as needed for allergies or rhinitis. ? ? ?Current Outpatient Medications (Hematological):  ?  Cyanocobalamin (B-12) 5000 MCG CAPS, Take 1 capsule by mouth every other day. ? ?Current Outpatient Medications (Other):  ?  cholecalciferol (VITAMIN D3) 25 MCG (1000 UNIT) tablet, Take 1,000 Units by mouth daily. ?  hydrocortisone (ANUCORT-HC) 25 MG suppository, UNWRAP AND INSERT ONE SUPPOSITORY RECTALLY EVERY NIGHT AT BEDTIME AS NEEDED FOR HEMORRHOIDS ?  hydrocortisone (ANUSOL-HC) 2.5 % rectal cream, Apply to affected area on hemorrhoids once daily as needed ?  hydrOXYzine (ATARAX/VISTARIL) 25 MG tablet, Take 1-2 tablets (25-50 mg total) by mouth at bedtime as  needed. ?  latanoprost (XALATAN) 0.005 % ophthalmic solution, Place 1 drop into both eyes.  ?  Multiple  Vitamin (MULTIVITAMIN) tablet, Take 1 tablet by mouth daily.   ?  Omega-3 Fatty Acids (FISH OIL CONCENTRATE PO), Take 1 capsule by mouth daily.  ?  potassium chloride (KLOR-CON) 10 MEQ tablet, TAKE 1 TABLET BY MOUTH EVERY DAY ? ?Medical History:  ?Past Medical History:  ?Diagnosis Date  ? Allergy   ? Cataract   ? beginnings of   ? Glaucoma   ? Heart murmur   ? noted on PCP exam 11/23/2012; "does not appear urgent"  ? Hemorrhoids   ? Hypertension   ? under control with meds., has been on med. > 20 yr.  ? Radius fracture 11/22/2012  ? left, comminuted  ? ?Allergies:  ?Allergies  ?Allergen Reactions  ? Amoxicillin Hives and Swelling  ? Spironolactone Diarrhea  ? Augmentin [Amoxicillin-Pot Clavulanate] Diarrhea  ?  Tore stomach up  ? Shrimp [Shellfish Allergy] Hives and Swelling  ?  ? ?Surgical History:  ?She  has a past surgical history that includes Partial hysterectomy; Endometrial ablation; Colonoscopy (03/2006); Colonoscopy with propofol (11/15/2011); Abdominal hysterectomy (06/02/2000); and Open reduction internal fixation (orif) distal radial fracture (Left, 11/29/2012). ?Family History:  ?Her family history includes Breast cancer in her maternal aunt; Colon cancer in her maternal uncle, mother, and paternal uncle; Colon polyps in her sister; Coronary artery disease in her father; Dementia in her father and mother; Diabetes in her maternal aunt and mother; Hypertension in her father and mother. ?Social History:  ? reports that she has never smoked. She has never used smokeless tobacco. She reports that she does not drink alcohol and does not use drugs. ? ?REVIEW OF SYSTEMS  : All other systems reviewed and negative except where noted in the History of Present Illness. ? ? ?PHYSICAL EXAM: ?BP 132/64   Pulse 69   Ht '5\' 6"'$  (1.676 m)   Wt 225 lb (102.1 kg)   BMI 36.32 kg/m?  ?General:   Pleasant, well developed female in no acute distress ?Head:   Normocephalic and atraumatic. ?Eyes:  sclerae anicteric,conjunctive  pink  ?Heart:   regular rate and rhythm ?Pulm:  Clear anteriorly; no wheezing ?Abdomen:   Soft, Obese AB, Active bowel sounds. No tenderness . Without guarding and Without rebound, No organomegaly appreciated. ?Rectal: anterior fissure noted, normal rectal tone, no internal hemorrhoids appreciated, no masses, non tender, brown stool, hemoccult Negative ?Extremities:  Without edema. ?Msk: Symmetrical without gross deformities. Peripheral pulses intact.  ?Neurologic:  Alert and  oriented x4;  No focal deficits.  ?Skin:   Dry and intact without significant lesions or rashes. ?Psychiatric:  Cooperative. Normal mood and affect. ? ? ? ?Vladimir Crofts, PA-C ?9:04 AM ? ? ?

## 2021-05-18 ENCOUNTER — Ambulatory Visit (INDEPENDENT_AMBULATORY_CARE_PROVIDER_SITE_OTHER): Payer: Medicare HMO | Admitting: Physician Assistant

## 2021-05-18 ENCOUNTER — Encounter: Payer: Self-pay | Admitting: Physician Assistant

## 2021-05-18 VITALS — BP 132/64 | HR 69 | Ht 66.0 in | Wt 225.0 lb

## 2021-05-18 DIAGNOSIS — K625 Hemorrhage of anus and rectum: Secondary | ICD-10-CM

## 2021-05-18 DIAGNOSIS — Z800 Family history of malignant neoplasm of digestive organs: Secondary | ICD-10-CM

## 2021-05-18 DIAGNOSIS — K648 Other hemorrhoids: Secondary | ICD-10-CM

## 2021-05-18 MED ORDER — NON FORMULARY: 1 refills | Status: DC

## 2021-05-18 MED ORDER — NA SULFATE-K SULFATE-MG SULF 17.5-3.13-1.6 GM/177ML PO SOLN: 1.0000 | ORAL | 0 refills | Status: DC

## 2021-05-18 NOTE — Patient Instructions (Addendum)
If you are age 68 or older, your body mass index should be between 23-30. Your Body mass index is 36.32 kg/m?Marland Kitchen If this is out of the aforementioned range listed, please consider follow up with your Primary Care Provider. ?________________________________________________________ ? ?The [facility] providers would like to encourage you to use [facility] to communicate with providers for non-urgent requests or questions.  Due to long hold times on the telephone, sending your provider a message by [facility] may be a faster and more efficient way to get a response.  Please allow 48 business hours for a response.  Please remember that this is for non-urgent requests.  ?_______________________________________________________ ? ?You have been scheduled for a colonoscopy. Please follow written instructions given to you at your visit today.  ?Please pick up your prep supplies at the pharmacy within the next 1-3 days. ?If you use inhalers (even only as needed), please bring them with you on the day of your procedure. ? ?Due to recent changes in healthcare laws, you may see the results of your imaging and laboratory studies on MyChart before your provider has had a chance to review them.  We understand that in some cases there may be results that are confusing or concerning to you. Not all laboratory results come back in the same time frame and the provider may be waiting for multiple results in order to interpret others.  Please give [place] 48 hours in order for your provider to thoroughly review all the results before contacting the office for clarification of your results.  ? ? ?Anal Fissure, Adult ? ?Diltiazem/lidocaine 3 x daily for 2 months sent to compound pharmacy  ? ?FIBER SUPPLEMENT ?You can do metamucil or fibercon once or twice a day but if this causes gas/bloating please switch to Benefiber or Citracel.  ?Fiber is good for constipation/diarrhea/irritable bowel syndrome.  ?It can also help with weight loss and can help lower  your bad cholesterol.  ?Please do 1 TBSP in the morning in water, coffee, or tea. It can take up to a month before you can see a difference with your bowel movements.  ?It is cheapest from [employer], [employer], [employer].  ? ?Sent this medication to a compound pharmacy: ? ?[facility] ?Address: [address] [place], [place] [address]  ?[phone] ? ? ?Please DO NOT go directly from our office to pick up this medication! Give the pharmacy 1 day to process the prescription. Extra time is required for them to compound your medication. ? ?Follow up should symptoms persist for secondary evaluation and possible surgical referral for repair. ?An anal fissure is a small tear or crack in the skin around the anus. Bleeding from a fissure usually stops on its own within a few minutes. However, bleeding will often occur again with each bowel movement until the crack heals. ?CAUSES ?This condition may be caused by: ?Passing large, hard stool (feces). ?Frequent diarrhea. ?Constipation. ?Inflammatory bowel disease (Crohn disease or ulcerative colitis). ?Infections. ?Anal sex. ?SYMPTOMS ?Symptoms of this condition include: ?Bleeding from the rectum. ?Small amounts of blood seen on your stool, on toilet paper, or in the toilet after a bowel movement. ?Painful bowel movements. ?Itching or irritation around the anus. ?DIAGNOSIS  ?A health care provider may diagnose this condition by closely examining the anal area. An anal fissure can usually be seen with careful inspection. In some cases, a rectal exam may be performed, or a short tube (anoscope) may be used to examine the anal canal. ?TREATMENT ?Treatment for  this condition may include: ?Taking steps to avoid constipation. This may include making changes to your diet, such as increasing your intake of fiber or fluid. ?Taking fiber supplements. These supplements can soften your stool to help make bowel movements easier. Your health care provider may also prescribe a  stool softener if your stool is often hard. ?Taking sitz baths. This may help to heal the tear. ?Using medicated creams or ointments. These may be prescribed to lessen discomfort. ?HOME CARE INSTRUCTIONS ?Eating and Drinking ?Avoid foods that may be constipating, such as bananas and dairy products. ?Drink enough fluid to keep your urine clear or pale yellow. ?Maintain a diet that is high in fruits, whole grains, and vegetables. ?General Instructions ?Keep the anal area as clean and dry as possible. ?Take sitz baths as told by your health care provider. Do not use soap in the sitz baths. ?Take over-the-counter and prescription medicines only as told by your health care provider. ?Use creams or ointments only as told by your health care provider. ?Keep all follow-up visits as told by your health care provider. This is important. ?SEEK MEDICAL CARE IF: ?You have more bleeding. ?You have a fever. ?You have diarrhea that is mixed with blood. ?You continue to have pain. ?Your problem is getting worse rather than better. ?  ?This information is not intended to replace advice given to you by your health care provider. Make sure you discuss any questions you have with your health care provider. ?  ?Document Released: 12/21/2004 Document Revised: 09/11/2014 Document Reviewed: 03/18/2014 ?Elsevier Interactive Patient Education ?2016 [place]. ? ?Thank you for entrusting me with your care and choosing [facility]. ? ?Vicie Mutters, PA-C ? ?

## 2021-05-29 ENCOUNTER — Encounter: Payer: BC Managed Care – PPO | Admitting: Internal Medicine

## 2021-06-08 DIAGNOSIS — M5459 Other low back pain: Secondary | ICD-10-CM | POA: Diagnosis not present

## 2021-06-11 DIAGNOSIS — L821 Other seborrheic keratosis: Secondary | ICD-10-CM | POA: Diagnosis not present

## 2021-06-28 ENCOUNTER — Encounter: Payer: Self-pay | Admitting: Certified Registered Nurse Anesthetist

## 2021-07-08 ENCOUNTER — Encounter: Payer: BC Managed Care – PPO | Admitting: Internal Medicine

## 2021-07-13 ENCOUNTER — Ambulatory Visit (AMBULATORY_SURGERY_CENTER): Payer: Medicare HMO | Admitting: Internal Medicine

## 2021-07-13 ENCOUNTER — Encounter: Payer: Self-pay | Admitting: Internal Medicine

## 2021-07-13 VITALS — BP 120/68 | HR 63 | Temp 96.9°F | Resp 12 | Ht 66.0 in | Wt 225.0 lb

## 2021-07-13 DIAGNOSIS — K635 Polyp of colon: Secondary | ICD-10-CM

## 2021-07-13 DIAGNOSIS — K648 Other hemorrhoids: Secondary | ICD-10-CM | POA: Diagnosis not present

## 2021-07-13 DIAGNOSIS — K625 Hemorrhage of anus and rectum: Secondary | ICD-10-CM | POA: Diagnosis not present

## 2021-07-13 DIAGNOSIS — D123 Benign neoplasm of transverse colon: Secondary | ICD-10-CM | POA: Diagnosis not present

## 2021-07-13 DIAGNOSIS — Z800 Family history of malignant neoplasm of digestive organs: Secondary | ICD-10-CM | POA: Diagnosis not present

## 2021-07-13 DIAGNOSIS — I10 Essential (primary) hypertension: Secondary | ICD-10-CM | POA: Diagnosis not present

## 2021-07-13 MED ORDER — SODIUM CHLORIDE 0.9 % IV SOLN: 500.0000 mL | Freq: Once | INTRAVENOUS | Status: DC

## 2021-07-13 NOTE — Op Note (Signed)
[facility] Patient Name: Rebecca [name] Procedure Date: 07/13/2021 3:12 PM MRN: [id number] Endoscopist: Jerene Bears , MD Age: 66 Referring MD:  Date of Birth: [birth year] Gender: Female Account #: [id number] Procedure:                Colonoscopy Indications:              Rectal bleeding, Family history of colon cancer in                            a first-degree relative; Last colonoscopy: November                            2018 Medicines:                Monitored Anesthesia Care Procedure:                Pre-Anesthesia Assessment:                           - Prior to the procedure, a History and Physical                            was performed, and patient medications and                            allergies were reviewed. The patient's tolerance of                            previous anesthesia was also reviewed. The risks                            and benefits of the procedure and the sedation                            options and risks were discussed with the patient.                            All questions were answered, and informed consent                            was obtained. Prior Anticoagulants: The patient has                            taken no previous anticoagulant or antiplatelet                            agents. ASA Grade Assessment: II - A patient with                            mild systemic disease. After reviewing the risks                            and benefits, the patient was deemed in  satisfactory condition to undergo the procedure.                           After obtaining informed consent, the colonoscope                            was passed under direct vision. Throughout the                            procedure, the patient's blood pressure, pulse, and                            oxygen saturations were monitored continuously. The                            Olympus PCF-H190DL [id number]) Colonoscope was                             introduced through the anus and advanced to the                            cecum, identified by appendiceal orifice and                            ileocecal valve. The colonoscopy was performed                            without difficulty. The patient tolerated the                            procedure well. The quality of the bowel                            preparation was good. The ileocecal valve,                            appendiceal orifice, and rectum were photographed. Scope In: 3:16:19 PM Scope Out: 3:30:57 PM Scope Withdrawal Time: 0 hours 10 minutes 21 seconds  Total Procedure Duration: 0 hours 14 minutes 38 seconds  Findings:                 The digital rectal exam was normal.                           A 6 mm polyp was found in the transverse colon. The                            polyp was sessile. The polyp was removed with a                            cold snare. Resection and retrieval were complete.                           External hemorrhoids were found during  retroflexion. The hemorrhoids were small.                           The exam was otherwise without abnormality. Complications:            No immediate complications. Estimated Blood Loss:     Estimated blood loss: none. Impression:               - One 6 mm polyp in the transverse colon, removed                            with a cold snare. Resected and retrieved.                           - External hemorrhoids.                           - The examination was otherwise normal. Recommendation:           - Patient has a contact number available for                            emergencies. The signs and symptoms of potential                            delayed complications were discussed with the                            patient. Return to normal activities tomorrow.                            Written discharge instructions were provided to the                             patient.                           - Resume previous diet.                           - Continue present medications.                           - Await pathology results.                           - Repeat colonoscopy in 5 years. Jerene Bears, MD 07/13/2021 3:36:47 PM This report has been signed electronically.

## 2021-07-13 NOTE — Patient Instructions (Signed)
YOU HAD AN ENDOSCOPIC PROCEDURE TODAY AT THE [facility]:   Refer to the procedure report that was given to you for any specific questions about what was found during the examination.  If the procedure report does not answer your questions, please call your gastroenterologist to clarify.  If you requested that your care partner not be given the details of your procedure findings, then the procedure report has been included in a sealed envelope for you to review at your convenience later.  YOU SHOULD EXPECT: Some feelings of bloating in the abdomen. Passage of more gas than usual.  Walking can help get rid of the air that was put into your GI tract during the procedure and reduce the bloating. If you had a lower endoscopy (such as a colonoscopy or flexible sigmoidoscopy) you may notice spotting of blood in your stool or on the toilet paper. If you underwent a bowel prep for your procedure, you may not have a normal bowel movement for a few days.  Please Note:  You might notice some irritation and congestion in your nose or some drainage.  This is from the oxygen used during your procedure.  There is no need for concern and it should clear up in a day or so.  SYMPTOMS TO REPORT IMMEDIATELY:  Following lower endoscopy (colonoscopy or flexible sigmoidoscopy):  Excessive amounts of blood in the stool  Significant tenderness or worsening of abdominal pains  Swelling of the abdomen that is new, acute  Fever of 100F or higher  Following upper endoscopy (EGD)  Vomiting of blood or coffee ground material  New chest pain or pain under the shoulder blades  Painful or persistently difficult swallowing  New shortness of breath  Fever of 100F or higher  Black, tarry-looking stools  For urgent or emergent issues, a gastroenterologist can be reached at any hour by calling [phone]. Do not use MyChart messaging for urgent concerns.    DIET:  We do recommend a small meal at first, but  then you may proceed to your regular diet.  Drink plenty of fluids but you should avoid alcoholic beverages for 24 hours.  ACTIVITY:  You should plan to take it easy for the rest of today and you should NOT DRIVE or use heavy machinery until tomorrow (because of the sedation medicines used during the test).    FOLLOW UP: Our staff will call the number listed on your records the next business day following your procedure.  We will call around 7:15- 8:00 am to check on you and address any questions or concerns that you may have regarding the information given to you following your procedure. If we do not reach you, we will leave a message.  If you develop any symptoms (ie: fever, flu-like symptoms, shortness of breath, cough etc.) before then, please call [phone].  If you test positive for Covid 19 in the 2 weeks post procedure, please call and report this information to us.    If any biopsies were taken you will be contacted by phone or by letter within the next 1-3 weeks.  Please call us at [phone] if you have not heard about the biopsies in 3 weeks.    SIGNATURES/CONFIDENTIALITY: You and/or your care partner have signed paperwork which will be entered into your electronic medical record.  These signatures attest to the fact that that the information above on your After Visit Summary has been reviewed and is understood.  Full responsibility of the confidentiality   of this discharge information lies with you and/or your care-partner.  

## 2021-07-13 NOTE — Progress Notes (Signed)
GASTROENTEROLOGY PROCEDURE H&P NOTE   Primary Care Physician: Tower, Wynelle Fanny, MD    Reason for Procedure:  Rectal bleeding and patient with family history of colon cancer  Plan:    Colonoscopy  Patient is appropriate for endoscopic procedure(s) in the ambulatory ([facility]) setting.  The nature of the procedure, as well as the risks, benefits, and alternatives were carefully and thoroughly reviewed with the patient. Ample time for discussion and questions allowed. The patient understood, was satisfied, and agreed to proceed.     HPI: Rebecca [name] is a 66 y.o. female who presents for colonoscopy.  Medical history as below.  Tolerated the prep.  No recent chest pain or shortness of breath.  No abdominal pain today.  Past Medical History:  Diagnosis Date   Allergy    Cataract    beginnings of    Glaucoma    Heart murmur    noted on PCP exam 11/23/2012; "does not appear urgent"   Hemorrhoids    Hypertension    under control with meds., has been on med. > 20 yr.   Radius fracture 11/22/2012   left, comminuted    Past Surgical History:  Procedure Laterality Date   ABDOMINAL HYSTERECTOMY  06/02/2000   partial   COLONOSCOPY  03/2006   COLONOSCOPY WITH PROPOFOL  11/15/2011   ENDOMETRIAL ABLATION     OPEN REDUCTION INTERNAL FIXATION (ORIF) DISTAL RADIAL FRACTURE Left 11/29/2012   Procedure: OPEN REDUCTION INTERNAL FIXATION (ORIF) LEFT  DISTAL RADIAL FRACTURE;  Surgeon: Wynonia Sours, MD;  Location: [facility];  Service: Orthopedics;  Laterality: Left;   PARTIAL HYSTERECTOMY      Prior to Admission medications   Medication Sig Start Date End Date Taking? Authorizing Provider  amLODipine-benazepril (LOTREL) 5-20 MG capsule Take 1 capsule by mouth daily. 11/24/20  Yes Tower, Wynelle Fanny, MD  cholecalciferol (VITAMIN D3) 25 MCG (1000 UNIT) tablet Take 1,000 Units by mouth daily.   Yes [provider]  Cyanocobalamin (B-12) 5000 MCG CAPS Take 1 capsule by mouth  every other day.   Yes [provider]  hydrochlorothiazide (HYDRODIURIL) 50 MG tablet Take 1 tablet (50 mg total) by mouth daily. 11/24/20  Yes Tower, Marne A, MD  latanoprost (XALATAN) 0.005 % ophthalmic solution Place 1 drop into both eyes.  06/27/13  Yes [provider]  potassium chloride (KLOR-CON) 10 MEQ tablet TAKE 1 TABLET BY MOUTH EVERY DAY 02/23/21  Yes Tower, Wynelle Fanny, MD  DENTA 5000 PLUS 1.1 % CREA dental cream SMARTSIG:sparingly By Mouth 06/14/21   [provider]  fluticasone (FLONASE) 50 MCG/ACT nasal spray Place 2 sprays into both nostrils daily as needed for allergies or rhinitis. 11/25/17   Tower, Wynelle Fanny, MD  hydrocortisone (ANUCORT-HC) 25 MG suppository UNWRAP AND INSERT ONE SUPPOSITORY RECTALLY EVERY NIGHT AT BEDTIME AS NEEDED FOR HEMORRHOIDS 10/10/15   Pleas Koch, NP  hydrocortisone (ANUSOL-HC) 2.5 % rectal cream Apply to affected area on hemorrhoids once daily as needed 10/10/15   Pleas Koch, NP  hydrOXYzine (ATARAX/VISTARIL) 25 MG tablet Take 1-2 tablets (25-50 mg total) by mouth at bedtime as needed. 07/14/19   Tasia Catchings, Amy V, PA-C  metoprolol succinate (TOPROL-XL) 25 MG 24 hr tablet Take 1 tablet (25 mg total) by mouth daily. Patient taking differently: Take 25 mg by mouth daily as needed. 07/23/19   Tower, Wynelle Fanny, MD  Multiple Vitamin (MULTIVITAMIN) tablet Take 1 tablet by mouth daily.      [provider]  NON FORMULARY Diltiazem 2%/Lidocaine compound Use 3 x rectally daily for 2 months to heal anal fissure 05/18/21   Vladimir Crofts, PA-C  Omega-3 Fatty Acids (FISH OIL CONCENTRATE PO) Take 1 capsule by mouth daily.     [provider]  traMADol (ULTRAM) 50 MG tablet Take 50 mg by mouth 4 (four) times daily. 05/07/21   [provider]    Current Outpatient Medications  Medication Sig Dispense Refill   amLODipine-benazepril (LOTREL) 5-20 MG capsule Take 1 capsule by mouth daily. 90 capsule 3   cholecalciferol  (VITAMIN D3) 25 MCG (1000 UNIT) tablet Take 1,000 Units by mouth daily.     Cyanocobalamin (B-12) 5000 MCG CAPS Take 1 capsule by mouth every other day.     hydrochlorothiazide (HYDRODIURIL) 50 MG tablet Take 1 tablet (50 mg total) by mouth daily. 90 tablet 3   latanoprost (XALATAN) 0.005 % ophthalmic solution Place 1 drop into both eyes.      potassium chloride (KLOR-CON) 10 MEQ tablet TAKE 1 TABLET BY MOUTH EVERY DAY 90 tablet 2   DENTA 5000 PLUS 1.1 % CREA dental cream SMARTSIG:sparingly By Mouth     fluticasone (FLONASE) 50 MCG/ACT nasal spray Place 2 sprays into both nostrils daily as needed for allergies or rhinitis. 48 g 3   hydrocortisone (ANUCORT-HC) 25 MG suppository UNWRAP AND INSERT ONE SUPPOSITORY RECTALLY EVERY NIGHT AT BEDTIME AS NEEDED FOR HEMORRHOIDS 12 suppository 0   hydrocortisone (ANUSOL-HC) 2.5 % rectal cream Apply to affected area on hemorrhoids once daily as needed 30 g 0   hydrOXYzine (ATARAX/VISTARIL) 25 MG tablet Take 1-2 tablets (25-50 mg total) by mouth at bedtime as needed. 12 tablet 0   metoprolol succinate (TOPROL-XL) 25 MG 24 hr tablet Take 1 tablet (25 mg total) by mouth daily. (Patient taking differently: Take 25 mg by mouth daily as needed.) 90 tablet 3   Multiple Vitamin (MULTIVITAMIN) tablet Take 1 tablet by mouth daily.       NON FORMULARY Diltiazem 2%/Lidocaine compound Use 3 x rectally daily for 2 months to heal anal fissure 30 g 1   Omega-3 Fatty Acids (FISH OIL CONCENTRATE PO) Take 1 capsule by mouth daily.      traMADol (ULTRAM) 50 MG tablet Take 50 mg by mouth 4 (four) times daily.     Current Facility-Administered Medications  Medication Dose Route Frequency Provider Last Rate Last Admin   0.9 %  sodium chloride infusion  500 mL Intravenous Once Cheris Tweten, Lajuan Lines, MD        Allergies as of 07/13/2021 - Review Complete 07/13/2021  Allergen Reaction Noted   Amoxicillin Hives and Swelling 11/24/2012   Spironolactone Diarrhea 01/30/2009   Augmentin  [amoxicillin-pot clavulanate] Diarrhea 09/28/2011   Shrimp [shellfish allergy] Hives and Swelling 04/24/2019    Family History  Problem Relation Age of Onset   Hypertension Father    Coronary artery disease Father        with CABG   Dementia Father    Hypertension Mother    Colon cancer Mother    Diabetes Mother    Dementia Mother    Breast cancer Maternal Aunt    Diabetes Maternal Aunt    Colon cancer Maternal Uncle    Colon cancer Paternal Uncle    Colon polyps Sister    Esophageal cancer Neg Hx    Rectal cancer Neg Hx    Stomach cancer Neg Hx     Social History   Socioeconomic History   Marital status:  Divorced    Spouse name: Not on file   Number of children: 2   Years of education: Not on file   Highest education level: Not on file  Occupational History   Occupation: Education officer, museum, also going to school for teaching degree   Occupation: retired  Tobacco Use   Smoking status: Never   Smokeless tobacco: Never  Vaping Use   Vaping Use: Never used  Substance and Sexual Activity   Alcohol use: No   Drug use: No   Sexual activity: Not on file  Other Topics Concern   Not on file  Social History Narrative   Regular exercise   Social Determinants of Health   Financial Resource Strain: Not on file  Food Insecurity: Not on file  Transportation Needs: Not on file  Physical Activity: Not on file  Stress: Not on file  Social Connections: Not on file  Intimate Partner Violence: Not on file    Physical Exam: Vital signs in last 24 hours: '@BP'$  (!) 156/87   Pulse 71   Temp (!) 96.9 F (36.1 C) (Temporal)   Resp 15   Ht '5\' 6"'$  (1.676 m)   Wt 225 lb (102.1 kg)   SpO2 100%   BMI 36.32 kg/m  GEN: NAD EYE: Sclerae anicteric ENT: MMM CV: Non-tachycardic Pulm: CTA b/l GI: Soft, NT/ND NEURO:  Alert & Oriented x 3   Zenovia Jarred, MD [facility]  07/13/2021 3:10 PM

## 2021-07-13 NOTE — Progress Notes (Signed)
Vitals-AS  Pt's states no medical or surgical changes since previsit or office visit.  

## 2021-07-13 NOTE — Progress Notes (Signed)
Called to room to assist during endoscopic procedure.  Patient ID and intended procedure confirmed with present staff. Received instructions for my participation in the procedure from the performing physician.  

## 2021-07-13 NOTE — Progress Notes (Signed)
Report given to PACU, vss 

## 2021-07-14 ENCOUNTER — Telehealth: Payer: Self-pay

## 2021-07-14 NOTE — Telephone Encounter (Signed)
  Follow up Call-     07/13/2021    2:19 PM 07/13/2021    2:13 PM  Call back number  Post procedure Call Back phone  # [phone]   Permission to leave phone message  Yes     Patient questions:  Do you have a fever, pain , or abdominal swelling? No. Pain Score  0 *  Have you tolerated food without any problems? Yes.    Have you been able to return to your normal activities? Yes.    Do you have any questions about your discharge instructions: Diet   No. Medications  No. Follow up visit  No.  Do you have questions or concerns about your Care? No.  Actions: * If pain score is 4 or above: No action needed, pain <4.

## 2021-07-16 DIAGNOSIS — Z833 Family history of diabetes mellitus: Secondary | ICD-10-CM | POA: Diagnosis not present

## 2021-07-16 DIAGNOSIS — Z809 Family history of malignant neoplasm, unspecified: Secondary | ICD-10-CM | POA: Diagnosis not present

## 2021-07-16 DIAGNOSIS — E876 Hypokalemia: Secondary | ICD-10-CM | POA: Diagnosis not present

## 2021-07-16 DIAGNOSIS — Z008 Encounter for other general examination: Secondary | ICD-10-CM | POA: Diagnosis not present

## 2021-07-16 DIAGNOSIS — Z8249 Family history of ischemic heart disease and other diseases of the circulatory system: Secondary | ICD-10-CM | POA: Diagnosis not present

## 2021-07-16 DIAGNOSIS — Z825 Family history of asthma and other chronic lower respiratory diseases: Secondary | ICD-10-CM | POA: Diagnosis not present

## 2021-07-16 DIAGNOSIS — R32 Unspecified urinary incontinence: Secondary | ICD-10-CM | POA: Diagnosis not present

## 2021-07-16 DIAGNOSIS — Z6835 Body mass index (BMI) 35.0-35.9, adult: Secondary | ICD-10-CM | POA: Diagnosis not present

## 2021-07-16 DIAGNOSIS — I10 Essential (primary) hypertension: Secondary | ICD-10-CM | POA: Diagnosis not present

## 2021-07-16 DIAGNOSIS — H409 Unspecified glaucoma: Secondary | ICD-10-CM | POA: Diagnosis not present

## 2021-07-20 ENCOUNTER — Encounter: Payer: Self-pay | Admitting: Internal Medicine

## 2021-07-27 ENCOUNTER — Other Ambulatory Visit: Payer: Self-pay | Admitting: Family Medicine

## 2021-07-27 DIAGNOSIS — Z1231 Encounter for screening mammogram for malignant neoplasm of breast: Secondary | ICD-10-CM

## 2021-08-27 ENCOUNTER — Ambulatory Visit
Admission: RE | Admit: 2021-08-27 | Discharge: 2021-08-27 | Disposition: A | Discharge status: Home / Self Care | Payer: Medicare HMO | Source: Ambulatory Visit | Attending: Family Medicine | Admitting: Family Medicine

## 2021-08-27 DIAGNOSIS — Z1231 Encounter for screening mammogram for malignant neoplasm of breast: Secondary | ICD-10-CM

## 2021-09-01 ENCOUNTER — Ambulatory Visit (INDEPENDENT_AMBULATORY_CARE_PROVIDER_SITE_OTHER): Payer: Medicare HMO | Admitting: Orthopaedic Surgery

## 2021-09-01 DIAGNOSIS — M542 Cervicalgia: Secondary | ICD-10-CM | POA: Diagnosis not present

## 2021-09-01 MED ORDER — DICLOFENAC SODIUM 75 MG PO TBEC: 75.0000 mg | DELAYED_RELEASE_TABLET | Freq: Two times a day (BID) | ORAL | 2 refills | Status: AC

## 2021-09-01 NOTE — Progress Notes (Signed)
Office Visit Note   Patient: Rebecca [name]           Date of Birth: [birth year]           MRN: [id number] Visit Date: 09/01/2021              Requested by: Tower, Wynelle Fanny, MD [place],  [employer] [place] [address] PCP: Abner Greenspan, MD   Assessment & Plan: Visit Diagnoses:  1. 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  At this point, I have discussed starting her on an NSAID as well as physical therapy to help with strengthening exercises.  She will follow-up with [place] as needed.  Call with concerns or questions.  Follow-Up Instructions: Return if symptoms worsen or fail to improve.   Orders:  Orders Placed This Encounter  Procedures   Ambulatory referral to Physical Therapy   Meds ordered this encounter  Medications   diclofenac (VOLTAREN) 75 MG EC tablet    Sig: Take 1 tablet (75 mg total) by mouth 2 (two) times daily. Bid prn pain    Dispense:  60 tablet    Refill:  2      Procedures: No procedures performed   Clinical Data: No additional findings.   Subjective: Chief Complaint  Patient presents with   Neck - Pain    HPI patient is a pleasant 66 year old female who comes in today with pain, popping and cracking to her neck for the past few years.  She denies any injury or change in activity but does note her symptoms have progressively worsened.  The pain she has is described as more of a constant nagging back and neck.  She does not have any specific aggravators.  She denies any weakness or paresthesias to either arm.  She does not take anything for the pain.  She has not been to physical therapy for this.  Review of Systems as detailed in HPI.  All others reviewed and are negative.   Objective: Vital Signs: There were no vitals taken for this visit.  Physical Exam well-developed well-nourished female no acute distress.  Alert oriented x3.  Ortho Exam cervical spine  exam shows no spinous or paraspinous tenderness.  She has no pain with range of motion.  She is neurovascular intact distally.  Specialty Comments:  No specialty comments available.  Imaging: X-rays of the cervical spine reviewed by me in canopy show advanced multilevel degenerative changes worse in the 5 6 and C6-7.   PMFS History: Patient Active Problem List   Diagnosis Date Noted   Crepitus of cervical spine 04/30/2021   Unilateral occipital headache 04/30/2021   Medicare annual wellness visit, subsequent 11/24/2020   Low back pain 10/14/2020   Breast pain 07/22/2020   Insomnia 07/23/2019   Heart rate problem 05/29/2019   Estrogen deficiency 11/27/2018   Encounter for pre-employment examination 11/02/2016   Fungal nail infection 04/27/2016   Sinusitis, chronic 03/12/2015   Fatigue 05/24/2014   Hyperlipidemia 10/30/2013   History of radius fracture 11/23/2012   Undiagnosed cardiac murmurs 11/23/2012   Obesity (BMI 30.0-34.9) 09/14/2011   Family history of colon cancer requiring screening colonoscopy 09/14/2011   Routine general medical examination at a health care facility 09/05/2011   Mixed incontinence urge and stress 04/14/2011   Hemorrhoids, internal, with bleeding 10/12/2010   External hemorrhoids 11/04/2009   ALLERGIC RHINITIS 08/13/2009   Hypokalemia 04/28/2007  Prediabetes 11/15/2006   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 > 20 yr.   Radius fracture 11/22/2012   left, comminuted    Family History  Problem Relation Age of Onset   Hypertension Father    Coronary artery disease Father         with CABG   Dementia Father    Hypertension Mother    Colon cancer Mother    Diabetes Mother    Dementia Mother    Breast cancer Maternal Aunt    Diabetes Maternal Aunt    Colon cancer Maternal Uncle    Colon cancer Paternal Uncle    Colon polyps Sister    Esophageal cancer Neg Hx    Rectal cancer Neg Hx    Stomach cancer Neg Hx     Past Surgical History:  Procedure Laterality Date   ABDOMINAL HYSTERECTOMY  06/02/2000   partial   COLONOSCOPY  03/2006   COLONOSCOPY WITH PROPOFOL  11/15/2011   ENDOMETRIAL ABLATION     OPEN REDUCTION INTERNAL FIXATION (ORIF) DISTAL RADIAL FRACTURE Left 11/29/2012   Procedure: OPEN REDUCTION INTERNAL FIXATION (ORIF) LEFT  DISTAL RADIAL FRACTURE;  Surgeon: Wynonia Sours, MD;  Location: [facility];  Service: Orthopedics;  Laterality: Left;   PARTIAL HYSTERECTOMY     Social History   Occupational History   Occupation: Education officer, museum, also going to school for teaching degree   Occupation: retired  Tobacco Use   Smoking status: Never   Smokeless tobacco: Never  Vaping Use   Vaping Use: Never used  Substance and Sexual Activity   Alcohol use: No   Drug use: No   Sexual activity: Not on file

## 2021-09-11 ENCOUNTER — Ambulatory Visit: Payer: BC Managed Care – PPO | Admitting: Family Medicine

## 2021-09-15 ENCOUNTER — Ambulatory Visit: Payer: BC Managed Care – PPO

## 2021-09-21 ENCOUNTER — Encounter: Payer: Self-pay | Admitting: Physical Therapy

## 2021-09-21 ENCOUNTER — Other Ambulatory Visit: Payer: Self-pay

## 2021-09-21 ENCOUNTER — Ambulatory Visit: Payer: Medicare HMO | Attending: Family Medicine | Admitting: Physical Therapy

## 2021-09-21 DIAGNOSIS — M542 Cervicalgia: Secondary | ICD-10-CM | POA: Diagnosis not present

## 2021-09-21 DIAGNOSIS — R293 Abnormal posture: Secondary | ICD-10-CM | POA: Diagnosis not present

## 2021-09-21 NOTE — Therapy (Signed)
OUTPATIENT PHYSICAL THERAPY CERVICAL EVALUATION   Patient Name: Rebecca [name] MRN: [id number] DOB:April 09, 1955, 66 y.o., female Today's Date: 09/21/2021   PT End of Session - 09/21/21 1536     Visit Number 1    Number of Visits 13    Date for PT Re-Evaluation 11/16/21    Authorization Type [employer] medicare and [employer] state health    Authorization Time Period TBD    Progress Note Due on Visit 10    PT Start Time 1540    PT Stop Time 1626    PT Time Calculation (min) 46 min    Activity Tolerance Patient tolerated treatment well    Behavior During Therapy WFL for tasks assessed/performed             Past Medical History:  Diagnosis Date   Allergy    Cataract    beginnings of    Glaucoma    Heart murmur    noted on PCP exam 11/23/2012; "does not appear urgent"   Hemorrhoids    Hypertension    under control with meds., has been on med. > 20 yr.   Radius fracture 11/22/2012   left, comminuted   Past Surgical History:  Procedure Laterality Date   ABDOMINAL HYSTERECTOMY  06/02/2000   partial   COLONOSCOPY  03/2006   COLONOSCOPY WITH PROPOFOL  11/15/2011   ENDOMETRIAL ABLATION     OPEN REDUCTION INTERNAL FIXATION (ORIF) DISTAL RADIAL FRACTURE Left 11/29/2012   Procedure: OPEN REDUCTION INTERNAL FIXATION (ORIF) LEFT  DISTAL RADIAL FRACTURE;  Surgeon: Wynonia Sours, MD;  Location: [facility];  Service: Orthopedics;  Laterality: Left;   PARTIAL HYSTERECTOMY     Patient Active Problem List   Diagnosis Date Noted   Crepitus of cervical spine 04/30/2021   Unilateral occipital headache 04/30/2021   Medicare annual wellness visit, subsequent 11/24/2020   Low back pain 10/14/2020   Breast pain 07/22/2020   Insomnia 07/23/2019   Heart rate problem 05/29/2019   Estrogen deficiency 11/27/2018   Encounter for pre-employment examination 11/02/2016   Fungal nail infection 04/27/2016   Sinusitis, chronic 03/12/2015   Fatigue 05/24/2014   Hyperlipidemia 10/30/2013    History of radius fracture 11/23/2012   Undiagnosed cardiac murmurs 11/23/2012   Obesity (BMI 30.0-34.9) 09/14/2011   Family history of colon cancer requiring screening colonoscopy 09/14/2011   Routine general medical examination at a health care facility 09/05/2011   Mixed incontinence urge and stress 04/14/2011   Hemorrhoids, internal, with bleeding 10/12/2010   External hemorrhoids 11/04/2009   ALLERGIC RHINITIS 08/13/2009   Hypokalemia 04/28/2007   Prediabetes 11/15/2006   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   BUNIONS, RIGHT FOOT 07/19/2006   HSV 07/06/2006   Essential hypertension 07/06/2006   URINARY INCONTINENCE, MIXED 07/06/2006    PCP: Abner Greenspan MD  REFERRING PROVIDER: Aundra Dubin, PA-C  REFERRING DIAG: M54.2 (ICD-10-CM) - Neck pain  THERAPY DIAG:  Cervicalgia - Plan: PT plan of care cert/re-cert  Abnormal posture - Plan: PT plan of care cert/re-cert  Rationale for Evaluation and Treatment Rehabilitation  ONSET DATE: chronic - several years in onset  SUBJECTIVE:  SUBJECTIVE STATEMENT: Pt states she has been hearing crepitus for several years "rice krispies" type sounds. Pt notes symptoms began shortly after she had her children in her thirties. Worse in morning. Pt reports some pain with lying down at night. Pt reports some difficulty sitting for prolonged periods varying by posture. Pt reports very infrequent headaches, states they do tend to occur with worsened neck pain, in center of forehead, occur about once a month and don't last very long. Denies numbness/tingling, some morning hand stiffness. Denies visual changes aside from cataracts. No difficulties with swallowing or speech.   PERTINENT HISTORY:  HTN  PAIN:  Are you having pain: Yes, 0/10 Location:  neck bilaterally  How would you describe your pain? Stiff, crepitus Best: 0/10 Worst: 7/10  Aggravating factors: turning head, prolonged sitting Easing factors: changing positions   PRECAUTIONS: None  WEIGHT BEARING RESTRICTIONS No  FALLS:  Has patient fallen in last 6 months? 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 Alignment is normal. Moderate severity multilevel endplate sclerosis is seen throughout all levels of the cervical spine. This is most prominent at the level of C6-C7. Mild intervertebral disc space narrowing is noted at the level of C5-C6, with moderate severity intervertebral disc space narrowing seen at C6-C7 and C7-T1. No other significant bone abnormalities are identified.   IMPRESSION: Multilevel degenerative changes, most prominent at the level of C6-C7.  PATIENT SURVEYS:  FOTO 77 (will select more appropriate outcome measure next visit)   COGNITION: Overall cognitive status: Within functional limits for tasks assessed   SENSATION: Not formally tested, pt denies sensory changes or neurological complaints  POSTURE: rounded shoulders, forward head, and flexed trunk   PALPATION: Mild TTP B levator scap/UT, palpable tightness nontender to rhomboids   CERVICAL ROM:   Active ROM A/PROM (deg) eval  Flexion 100%  Extension 75%  Right lateral flexion 50%  Left lateral flexion 25%  Right rotation 45   Left rotation 38   (Blank rows = not tested) Comments: stiffness/pulling with flex/ext, L lateral flex more uncomfortable than R,   UPPER EXTREMITY ROM:  Active ROM Right eval Left eval  Shoulder flexion WNL WNL  Shoulder abduction WNL WNL  Shoulder internal rotation    Shoulder external rotation    Elbow flexion    Elbow extension    Wrist flexion    Wrist extension     (Blank rows = not tested) Comments: nonpainful  UPPER EXTREMITY MMT:  MMT Right eval Left eval  Shoulder flexion 4 4  Shoulder extension 4 4  Shoulder abduction 4 4  Shoulder extension 4 4  Shoulder internal rotation 4 4  Shoulder external rotation 4 4  Elbow flexion 5 5  Elbow extension 5 5  Grip strength     (Blank rows = not tested)  Comments: no pain with any of above  CERVICAL SPECIAL TESTS:  Negative spurlings test   TODAY'S TREATMENT:  HEP review/performance, education as below  PATIENT EDUCATION:  Education details: Pt education on PT impairments, prognosis, and POC. Rationale for interventions, safe/appropriate HEP performance Person educated: Patient Education method: Explanation, Demonstration, Tactile cues, Verbal cues, and Handouts Education comprehension: verbalized understanding, returned demonstration, verbal cues required, tactile cues required, and needs further education    HOME EXERCISE PROGRAM: Access Code: [facility] URL: [email or web address] Date: 09/21/2021 Prepared by: Enis Slipper  Exercises - Gentle Levator Scapulae Stretch  - 1 x daily - 1 sets - 3 reps - 30sec hold - Seated Cervical Retraction  - 1 x daily - 7 x weekly - 3 sets - 10 reps  ASSESSMENT:  CLINICAL IMPRESSION: Patient is a pleasant 66 y.o. woman who was seen today for physical therapy evaluation and treatment for chronic neck pain and crepitus over past several years. Pt states she is able to perform most activities with increased time and modifications, primary exacerbating  factors of turning head and prolonged sitting, symptoms at worst in morning. Today pt demonstrates palpable tightness/tenderness in paracervical/periscapular musculature and cervical mobility deficits, responds well to HEP and tolerates evaluation well overall. 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   Pt departs today's session in no acute distress, all voiced questions/concerns addressed appropriately from PT perspective.     OBJECTIVE IMPAIRMENTS decreased mobility, decreased ROM, decreased strength, postural dysfunction, and pain.   ACTIVITY LIMITATIONS sitting and sleeping  PARTICIPATION LIMITATIONS: driving, community activity, and occupation  PERSONAL FACTORS Age, Time since onset of injury/illness/exacerbation, and 1 comorbidity: HTN  are also affecting patient's functional outcome.   REHAB POTENTIAL: Good  CLINICAL DECISION MAKING: Stable/uncomplicated  EVALUATION COMPLEXITY: Low   GOALS: Goals reviewed with patient? Yes  SHORT TERM GOALS: Target date: 10/19/2021   Pt will demonstrate appropriate understanding and performance of initially prescribed HEP in order to facilitate improved independence with management of symptoms.  Baseline: HEP provided on eval Goal status: INITIAL   2. Pt will improve within MCID on appropriate outcome measure (NDI most likely) in order to demonstrate improved perception of function due to symptoms.  Baseline: Administered FOTO with score of 77 - will choose new outcome measure at next visit  Goal status: INITIAL   LONG TERM GOALS: Target date: 11/16/2021  Pt will score score greater than MCID on appropriate outcome measure (NDI most likely) in order to demonstrate improved perception of function due to symptoms. Baseline: Administered FOTO with score of  77 and predicted measure of 75 by end - will choose new outcome measure at next visit Goal status: INITIAL  Pt will demonstrate at least 60 degrees of active cervical ROM in order to demonstrate improved environmental awareness and safety with driving.  Baseline: 38 deg L rotation, 45 deg R rotation Goal status: INITIAL  3. Pt will report/demonstrate ability to sit for >30 min with less than 3 point increase on NPS in order to demonstrate improved tolerance to work activities such as reading/writing.  Baseline: variable based on posture/position, pain up to 7/10  Goal status: INITIAL    PLAN: PT FREQUENCY: 1-2x/week (tentatively plan for 2x/week for 4 weeks then taper to 1x/week for 4 weeks)  PT DURATION: 8 weeks  PLANNED INTERVENTIONS: Therapeutic exercises, Therapeutic activity, Neuromuscular re-education, Balance training, Gait training, Patient/Family education, Self Care, Joint mobilization, Aquatic Therapy, Dry Needling, Electrical stimulation, Cryotherapy, Moist heat, Manual therapy, and Re-evaluation  PLAN FOR  NEXT SESSION: Progress ROM/strengthening exercises as able/appropriate, review HEP.    Leeroy Cha PT, DPT 09/21/2021 4:51 PM

## 2021-09-29 ENCOUNTER — Encounter: Payer: BC Managed Care – PPO | Admitting: Physical Therapy

## 2021-10-01 ENCOUNTER — Ambulatory Visit: Payer: Medicare HMO | Admitting: Physical Therapy

## 2021-10-08 ENCOUNTER — Ambulatory Visit: Payer: Medicare HMO | Admitting: Physical Therapy

## 2021-10-13 ENCOUNTER — Encounter: Payer: BC Managed Care – PPO | Admitting: Physical Therapy

## 2021-10-23 DIAGNOSIS — Z79899 Other long term (current) drug therapy: Secondary | ICD-10-CM | POA: Diagnosis not present

## 2021-10-24 LAB — LAB REPORT - SCANNED: EGFR: 94

## 2021-10-28 DIAGNOSIS — E785 Hyperlipidemia, unspecified: Secondary | ICD-10-CM | POA: Diagnosis not present

## 2021-10-28 DIAGNOSIS — R109 Unspecified abdominal pain: Secondary | ICD-10-CM | POA: Diagnosis not present

## 2021-10-28 DIAGNOSIS — R69 Illness, unspecified: Secondary | ICD-10-CM | POA: Diagnosis not present

## 2021-10-28 DIAGNOSIS — I10 Essential (primary) hypertension: Secondary | ICD-10-CM | POA: Diagnosis not present

## 2021-10-28 DIAGNOSIS — R7303 Prediabetes: Secondary | ICD-10-CM | POA: Diagnosis not present

## 2021-10-28 DIAGNOSIS — M1990 Unspecified osteoarthritis, unspecified site: Secondary | ICD-10-CM | POA: Diagnosis not present

## 2021-10-28 DIAGNOSIS — Z7189 Other specified counseling: Secondary | ICD-10-CM | POA: Diagnosis not present

## 2021-10-28 DIAGNOSIS — I700 Atherosclerosis of aorta: Secondary | ICD-10-CM | POA: Diagnosis not present

## 2021-10-28 DIAGNOSIS — Z1211 Encounter for screening for malignant neoplasm of colon: Secondary | ICD-10-CM | POA: Diagnosis not present

## 2021-10-28 DIAGNOSIS — Z0289 Encounter for other administrative examinations: Secondary | ICD-10-CM | POA: Diagnosis not present

## 2021-11-09 ENCOUNTER — Telehealth: Payer: Self-pay | Admitting: Family Medicine

## 2021-11-09 NOTE — Telephone Encounter (Signed)
Patient returned my call.  She stated she changed providers to [facility] in [place]

## 2021-11-09 NOTE — Telephone Encounter (Signed)
Dr. Glori Bickers removed from PCP

## 2021-11-09 NOTE — Telephone Encounter (Signed)
Left message for patient to call back and schedule Medicare Annual Wellness Visit (AWV) either virtually or phone.   Left  my jabber number [phone]   Last AWV 11/24/20    45 min for awv-i and in office appointments 30 min for awv-s  phone/virtual appointments

## 2021-11-12 ENCOUNTER — Other Ambulatory Visit: Payer: Self-pay | Admitting: Family Medicine

## 2021-11-12 DIAGNOSIS — E876 Hypokalemia: Secondary | ICD-10-CM

## 2021-11-30 DIAGNOSIS — N952 Postmenopausal atrophic vaginitis: Secondary | ICD-10-CM | POA: Diagnosis not present

## 2021-11-30 DIAGNOSIS — Z0289 Encounter for other administrative examinations: Secondary | ICD-10-CM | POA: Diagnosis not present

## 2021-11-30 DIAGNOSIS — R109 Unspecified abdominal pain: Secondary | ICD-10-CM | POA: Diagnosis not present

## 2021-12-01 DIAGNOSIS — N898 Other specified noninflammatory disorders of vagina: Secondary | ICD-10-CM | POA: Diagnosis not present

## 2021-12-01 DIAGNOSIS — N952 Postmenopausal atrophic vaginitis: Secondary | ICD-10-CM | POA: Diagnosis not present

## 2021-12-02 ENCOUNTER — Other Ambulatory Visit: Payer: Self-pay | Admitting: Physician Assistant

## 2021-12-07 ENCOUNTER — Ambulatory Visit
Admission: RE | Admit: 2021-12-07 | Discharge: 2021-12-07 | Disposition: A | Discharge status: Home / Self Care | Payer: BC Managed Care – PPO | Source: Ambulatory Visit | Attending: Internal Medicine | Admitting: Internal Medicine

## 2021-12-07 ENCOUNTER — Other Ambulatory Visit: Payer: Self-pay | Admitting: Internal Medicine

## 2021-12-07 DIAGNOSIS — R109 Unspecified abdominal pain: Secondary | ICD-10-CM

## 2021-12-14 ENCOUNTER — Other Ambulatory Visit: Payer: BC Managed Care – PPO

## 2021-12-21 ENCOUNTER — Telehealth: Payer: Self-pay

## 2021-12-21 NOTE — Telephone Encounter (Signed)
Patient called in and was wanting to know if Dr. Glori Bickers will take her back on as a patient. She stated she isn't satisfied with her new provider. Please advise. Thank you!

## 2021-12-21 NOTE — Telephone Encounter (Signed)
Where did she go? Why did she leave here?

## 2021-12-22 NOTE — Telephone Encounter (Signed)
That is fine 

## 2021-12-22 NOTE — Telephone Encounter (Signed)
Spoke to pt, she stated she [facility] to a [facility] called [facility] Pt stated she was looking for something that was dedicated to her age group but after all, it wasn't. Pt states it was nothing against Dr. Glori Bickers.  Call back # [phone]

## 2021-12-22 NOTE — Telephone Encounter (Signed)
Spoke to the pt, scheduled ov for 01/05/22

## 2022-01-05 ENCOUNTER — Ambulatory Visit (INDEPENDENT_AMBULATORY_CARE_PROVIDER_SITE_OTHER): Payer: Medicare HMO | Admitting: Family Medicine

## 2022-01-05 ENCOUNTER — Encounter: Payer: Self-pay | Admitting: Family Medicine

## 2022-01-05 VITALS — BP 136/72 | HR 62 | Temp 97.4°F | Ht 66.0 in | Wt 223.0 lb

## 2022-01-05 DIAGNOSIS — R7303 Prediabetes: Secondary | ICD-10-CM

## 2022-01-05 DIAGNOSIS — E669 Obesity, unspecified: Secondary | ICD-10-CM

## 2022-01-05 DIAGNOSIS — E876 Hypokalemia: Secondary | ICD-10-CM

## 2022-01-05 DIAGNOSIS — I10 Essential (primary) hypertension: Secondary | ICD-10-CM

## 2022-01-05 DIAGNOSIS — K760 Fatty (change of) liver, not elsewhere classified: Secondary | ICD-10-CM

## 2022-01-05 NOTE — Assessment & Plan Note (Signed)
With prediabetes, HTN and fatty liver   Discussed how this problem influences overall health and the risks it imposes  Reviewed plan for weight loss with lower calorie diet (via better food choices and also portion control or program like weight watchers) and exercise building up to or more than 30 minutes 5 days per week including some aerobic activity  Planning to start NOOM program and I supported this Has good alt for both indoor and outdoor exercise (home equip as well) for both cardiol and strength training   Muscle gain would help wt, met rate and insulin tolerance

## 2022-01-05 NOTE — Assessment & Plan Note (Signed)
bp in fair control at this time  BP Readings from Last 1 Encounters:  01/05/22 136/72   No changes needed Most recent labs reviewed  Disc lifstyle change with low sodium diet and exercise  Plan to continue  Amlodipine -benazepril 5-20 daily Toprol xl 25 mg prn daily (noted we discussed watching pulse rate when she uses this prn as her rate did go into 40s at night )  Hctz 50 mg daily  Lab today

## 2022-01-05 NOTE — Progress Notes (Signed)
Subjective:    Patient ID: Rebecca [name], female    DOB: Jan 20, 1955, 67 y.o.   MRN: [id number]  HPI Pt presents to re est after leaving the practice for a short time  Wt Readings from Last 3 Encounters:  01/05/22 223 lb (101.2 kg)  07/13/21 225 lb (102.1 kg)  05/18/21 225 lb (102.1 kg)   35.99 kg/m  Doing ok overall Had covid 2 wk before xmas - had a lot of cough  Was very hoarse  She took paxlovid   She is tutoring part time  Is exposed to a lot of illness  Decided to wear mask full time   Had an ultrasound of upper abdomen due to pain  Found hepatic steatosis   Lab Results  Component Value Date   ALT 14 10/14/2020   AST 19 10/14/2020   ALKPHOS 57 10/14/2020   BILITOT 0.8 10/14/2020   Needs to try and loose weight  Goal for this year  Signed up for the NOOM program   Has a home gym Enjoys exercise and thinks she will be able to fit it in    HTN bp is stable today  No cp or palpitations or headaches or edema  No side effects to medicines  BP Readings from Last 3 Encounters:  01/05/22 136/72  07/13/21 120/68  05/18/21 132/64     Amlodipine -benazepril 5-20 daily Toprol xl 25 mg prn daily Hctz 50 mg daily  Also klor con Lab Results  Component Value Date   CREATININE 0.88 10/14/2020   BUN 16 10/14/2020   NA 136 10/14/2020   K 3.6 10/14/2020   CL 97 10/14/2020   CO2 28 10/14/2020   Prediabetes Lab Results  Component Value Date   HGBA1C 6.3 02/24/2021   Hyperlipidemia Lab Results  Component Value Date   CHOL 206 (H) 02/24/2021   HDL 61.60 02/24/2021   LDLCALC 127 (H) 02/24/2021   LDLDIRECT 107.3 08/11/2009   TRIG 90.0 02/24/2021   CHOLHDL 3 02/24/2021   Patient Active Problem List   Diagnosis Date Noted   Hepatic steatosis 01/05/2022   Crepitus of cervical spine 04/30/2021   Unilateral occipital headache 04/30/2021   Medicare annual wellness visit, subsequent 11/24/2020   Low back pain 10/14/2020   Breast pain 07/22/2020   Insomnia  07/23/2019   Heart rate problem 05/29/2019   Estrogen deficiency 11/27/2018   Encounter for pre-employment examination 11/02/2016   Fungal nail infection 04/27/2016   Sinusitis, chronic 03/12/2015   Fatigue 05/24/2014   Hyperlipidemia 10/30/2013   History of radius fracture 11/23/2012   Undiagnosed cardiac murmurs 11/23/2012   Obesity (BMI 30.0-34.9) 09/14/2011   Family history of colon cancer requiring screening colonoscopy 09/14/2011   Routine general medical examination at a health care facility 09/05/2011   Mixed incontinence urge and stress 04/14/2011   Hemorrhoids, internal, with bleeding 10/12/2010   External hemorrhoids 11/04/2009   ALLERGIC RHINITIS 08/13/2009   Hypokalemia 04/28/2007   Prediabetes 11/15/2006   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 > 20 yr.  Radius fracture 11/22/2012   left, comminuted   Past Surgical History:  Procedure Laterality Date   ABDOMINAL HYSTERECTOMY  06/02/2000   partial   COLONOSCOPY  03/2006   COLONOSCOPY WITH PROPOFOL  11/15/2011   ENDOMETRIAL ABLATION     OPEN REDUCTION INTERNAL FIXATION (ORIF) DISTAL RADIAL FRACTURE Left 11/29/2012   Procedure: OPEN REDUCTION INTERNAL FIXATION (ORIF) LEFT  DISTAL RADIAL FRACTURE;  Surgeon: Wynonia Sours, MD;  Location: [facility];  Service: Orthopedics;  Laterality: Left;   PARTIAL HYSTERECTOMY     Social History   Tobacco Use   Smoking status: Never   Smokeless tobacco: Never  Vaping Use   Vaping Use: Never used  Substance Use Topics   Alcohol use: No   Drug use: No   Family History  Problem Relation Age of  Onset   Hypertension Father    Coronary artery disease Father        with CABG   Dementia Father    Hypertension Mother    Colon cancer Mother    Diabetes Mother    Dementia Mother    Breast cancer Maternal Aunt    Diabetes Maternal Aunt    Colon cancer Maternal Uncle    Colon cancer Paternal Uncle    Colon polyps Sister    Esophageal cancer Neg Hx    Rectal cancer Neg Hx    Stomach cancer Neg Hx    Allergies  Allergen Reactions   Amoxicillin Hives and Swelling   Spironolactone Diarrhea   Augmentin [Amoxicillin-Pot Clavulanate] Diarrhea    Tore stomach up   Shrimp [Shellfish Allergy] Hives and Swelling   Current Outpatient Medications on File Prior to Visit  Medication Sig Dispense Refill   amLODipine-benazepril (LOTREL) 5-20 MG capsule Take 1 capsule by mouth daily. 90 capsule 3   cholecalciferol (VITAMIN D3) 25 MCG (1000 UNIT) tablet Take 1,000 Units by mouth daily.     Cyanocobalamin (B-12) 5000 MCG CAPS Take 1 capsule by mouth every other day.     DENTA 5000 PLUS 1.1 % CREA dental cream SMARTSIG:sparingly By Mouth     diclofenac (VOLTAREN) 75 MG EC tablet Take 1 tablet (75 mg total) by mouth 2 (two) times daily. Bid prn pain 60 tablet 2   fluticasone (FLONASE) 50 MCG/ACT nasal spray Place 2 sprays into both nostrils daily as needed for allergies or rhinitis. 48 g 3   hydrochlorothiazide (HYDRODIURIL) 50 MG tablet Take 1 tablet (50 mg total) by mouth daily. 90 tablet 3   hydrocortisone (ANUCORT-HC) 25 MG suppository UNWRAP AND INSERT ONE SUPPOSITORY RECTALLY EVERY NIGHT AT BEDTIME AS NEEDED FOR HEMORRHOIDS 12 suppository 0   hydrocortisone (ANUSOL-HC) 2.5 % rectal cream Apply to affected area on hemorrhoids once daily as needed 30 g 0   hydrOXYzine (ATARAX/VISTARIL) 25 MG tablet Take 1-2 tablets (25-50 mg total) by mouth at bedtime as needed. 12 tablet 0   latanoprost (XALATAN) 0.005 % ophthalmic solution Place 1 drop into both eyes.      metoprolol succinate (TOPROL-XL) 25  MG 24 hr tablet Take 1 tablet (25 mg total) by mouth daily. (Patient taking differently: Take 25 mg by mouth daily as needed.) 90 tablet 3   Multiple Vitamin (MULTIVITAMIN) tablet Take 1 tablet by mouth daily.       NON FORMULARY Diltiazem 2%/Lidocaine compound Use 3 x rectally daily for 2 months to heal anal fissure 30 g 1   Omega-3 Fatty Acids (FISH OIL CONCENTRATE PO) Take 1 capsule by mouth  daily.      potassium chloride (KLOR-CON) 10 MEQ tablet TAKE 1 TABLET BY MOUTH EVERY DAY 90 tablet 2   No current facility-administered medications on file prior to visit.     Review of Systems  Constitutional:  Negative for activity change, appetite change, fatigue, fever and unexpected weight change.  HENT:  Negative for congestion, ear pain, rhinorrhea, sinus pressure and sore throat.   Eyes:  Negative for pain, redness and visual disturbance.  Respiratory:  Negative for cough, shortness of breath and wheezing.   Cardiovascular:  Negative for chest pain and palpitations.  Gastrointestinal:  Negative for abdominal pain, blood in stool, constipation and diarrhea.  Endocrine: Negative for polydipsia and polyuria.  Genitourinary:  Negative for dysuria, frequency and urgency.  Musculoskeletal:  Negative for arthralgias, back pain and myalgias.  Skin:  Negative for pallor and rash.  Allergic/Immunologic: Negative for environmental allergies.  Neurological:  Negative for dizziness, syncope and headaches.  Hematological:  Negative for adenopathy. Does not bruise/bleed easily.  Psychiatric/Behavioral:  Negative for decreased concentration and dysphoric mood. The patient is not nervous/anxious.        Objective:   Physical Exam Constitutional:      General: She is not in acute distress.    Appearance: Normal appearance. She is well-developed. She is obese. She is not ill-appearing or diaphoretic.  HENT:     Head: Normocephalic and atraumatic.  Eyes:     Conjunctiva/sclera: Conjunctivae normal.      Pupils: Pupils are equal, round, and reactive to light.  Neck:     Thyroid: No thyromegaly.     Vascular: No carotid bruit or JVD.  Cardiovascular:     Rate and Rhythm: Normal rate and regular rhythm.     Pulses: Normal pulses.     Heart sounds: Normal heart sounds.     No gallop.  Pulmonary:     Effort: Pulmonary effort is normal. No respiratory distress.     Breath sounds: Normal breath sounds. No wheezing or rales.  Abdominal:     General: Abdomen is protuberant. Bowel sounds are normal. There is no distension or abdominal bruit.     Palpations: Abdomen is soft. There is no shifting dullness, hepatomegaly, splenomegaly, mass or pulsatile mass.     Tenderness: There is no abdominal tenderness. There is no guarding or rebound. Negative signs include Murphy's sign.  Musculoskeletal:     Cervical back: Normal range of motion and neck supple.     Right lower leg: No edema.     Left lower leg: No edema.  Lymphadenopathy:     Cervical: No cervical adenopathy.  Skin:    General: Skin is warm and dry.     Coloration: Skin is not pale.     Findings: No rash.  Neurological:     Mental Status: She is alert.     Coordination: Coordination normal.     Deep Tendon Reflexes: Reflexes are normal and symmetric. Reflexes normal.  Psychiatric:        Mood and Affect: Mood normal.           Assessment & Plan:   Problem List Items Addressed This Visit       Cardiovascular and Mediastinum   Essential hypertension    bp in fair control at this time  BP Readings from Last 1 Encounters:  01/05/22 136/72  No changes needed Most recent labs reviewed  Disc lifstyle change with low sodium diet and exercise  Plan to continue  Amlodipine -benazepril 5-20 daily Toprol xl 25 mg prn daily (noted we discussed watching pulse rate when she uses this prn as her rate did go into 40s at night )  Hctz 50 mg daily  Lab today      Relevant Orders   Comprehensive metabolic panel     Digestive    Hepatic steatosis - Primary    This was noted on [place] ordered by prev pcp  Imaging reviewed today Otherwise reassuring Last LFT nl  Will re check today   No pain or other symptoms Disc plan for wt loss with NOOM program as a guide       Relevant Orders   Comprehensive metabolic panel     Other   Hypokalemia   Relevant Orders   Comprehensive metabolic panel   Obesity (BMI 30.0-34.9)    With prediabetes, HTN and fatty liver   Discussed how this problem influences overall health and the risks it imposes  Reviewed plan for weight loss with lower calorie diet (via better food choices and also portion control or program like weight watchers) and exercise building up to or more than 30 minutes 5 days per week including some aerobic activity  Planning to start NOOM program and I supported this Has good alt for both indoor and outdoor exercise (home equip as well) for both cardiol and strength training   Muscle gain would help wt, met rate and insulin tolerance      Prediabetes    A1c today  disc imp of low glycemic diet and wt loss to prevent DM2   Plans to start the NOOM wt loss program      Relevant Orders   Hemoglobin A1c

## 2022-01-05 NOTE — Patient Instructions (Addendum)
Go forward with the NOOM program for weight loss   Try to get most of your carbohydrates from produce (with the exception of white potatoes)  Eat less bread/pasta/rice/snack foods/cereals/sweets and other items from the middle of the grocery store (processed carbs)   Think about what you want to do for exercise (both cardio and strength training)   Take care of yourself   Wearing a mask at work is a good idea   Lab today for liver/kidney and A1c

## 2022-01-05 NOTE — Assessment & Plan Note (Signed)
A1c today  disc imp of low glycemic diet and wt loss to prevent DM2   Plans to start the NOOM wt loss program

## 2022-01-05 NOTE — Assessment & Plan Note (Addendum)
This was noted on [place] ordered by prev pcp  Imaging reviewed today Otherwise reassuring Last LFT nl  Will re check today   No pain or other symptoms Disc plan for wt loss with NOOM program as a guide

## 2022-01-06 LAB — COMPREHENSIVE METABOLIC PANEL
ALT: 13 U/L (ref 0–35)
AST: 16 U/L (ref 0–37)
Albumin: 4.3 g/dL (ref 3.5–5.2)
Alkaline Phosphatase: 51 U/L (ref 39–117)
BUN: 20 mg/dL (ref 6–23)
CO2: 28 mEq/L (ref 19–32)
Calcium: 9.7 mg/dL (ref 8.4–10.5)
Chloride: 99 mEq/L (ref 96–112)
Creatinine, Ser: 0.77 mg/dL (ref 0.40–1.20)
GFR: 79.11 mL/min (ref >60.00)
Glucose, Bld: 100 mg/dL — ABNORMAL HIGH (ref 70–99)
Potassium: 4.1 mEq/L (ref 3.5–5.1)
Sodium: 138 mEq/L (ref 135–145)
Total Bilirubin: 0.4 mg/dL (ref 0.2–1.2)
Total Protein: 7.1 g/dL (ref 6.0–8.3)

## 2022-01-06 LAB — HEMOGLOBIN A1C: Hgb A1c MFr Bld: 6.5 % (ref 4.6–6.5)

## 2022-02-11 DIAGNOSIS — H524 Presbyopia: Secondary | ICD-10-CM | POA: Diagnosis not present

## 2022-04-09 DIAGNOSIS — Z0100 Encounter for examination of eyes and vision without abnormal findings: Secondary | ICD-10-CM | POA: Diagnosis not present

## 2022-05-06 ENCOUNTER — Encounter: Payer: Self-pay | Admitting: Family Medicine

## 2022-05-06 ENCOUNTER — Ambulatory Visit (INDEPENDENT_AMBULATORY_CARE_PROVIDER_SITE_OTHER): Payer: Medicare HMO | Admitting: Family Medicine

## 2022-05-06 VITALS — BP 154/74 | HR 64 | Temp 97.6°F | Ht 66.0 in | Wt 221.1 lb

## 2022-05-06 DIAGNOSIS — E669 Obesity, unspecified: Secondary | ICD-10-CM | POA: Diagnosis not present

## 2022-05-06 DIAGNOSIS — R7303 Prediabetes: Secondary | ICD-10-CM | POA: Diagnosis not present

## 2022-05-06 DIAGNOSIS — E7800 Pure hypercholesterolemia, unspecified: Secondary | ICD-10-CM

## 2022-05-06 DIAGNOSIS — Z6835 Body mass index (BMI) 35.0-35.9, adult: Secondary | ICD-10-CM | POA: Diagnosis not present

## 2022-05-06 DIAGNOSIS — I10 Essential (primary) hypertension: Secondary | ICD-10-CM | POA: Diagnosis not present

## 2022-05-06 DIAGNOSIS — K760 Fatty (change of) liver, not elsewhere classified: Secondary | ICD-10-CM

## 2022-05-06 DIAGNOSIS — R0789 Other chest pain: Secondary | ICD-10-CM

## 2022-05-06 LAB — POCT GLYCOSYLATED HEMOGLOBIN (HGB A1C): Hemoglobin A1C: 5.8 % — AB (ref 4.0–5.6)

## 2022-05-06 NOTE — Patient Instructions (Addendum)
Try to work with your NOOM program   EKG today  A1c today  I want you to see a cardiologist  Also our pharmacy team  Get [place] a copy of the CT when you can  I put the referral in  Please let us know if you don't hear in 1-2 weeks   Follow up with me in 3 months   Continue current medicines Take metoprolol every instead of as needed    If any new /worse/persistent chest symptoms or shortness of breath go to the ER

## 2022-05-06 NOTE — Progress Notes (Signed)
Subjective:    Patient ID: Rebecca [name], female    DOB: 06/06/1955, 67 y.o.   MRN: [id number]  HPI Pt presents for f/u of HTN Occ L chest pressure   [facility]- was told she had plaque in her coronary arteries (mild)   Wt Readings from Last 3 Encounters:  05/06/22 221 lb 2 oz (100.3 kg)  01/05/22 223 lb (101.2 kg)  07/13/21 225 lb (102.1 kg)   35.69 kg/m  Vitals:   05/06/22 0920  BP: (!) 154/74  Pulse: 64  Temp: 97.6 F (36.4 C)  SpO2: 98%   Walking some  Cut fried foods  Drinking more water   Has NOOM- requires a lot of reading/not compliant   HTN Still having issues with BP medicines - thinks it is affecting hair growth (hair breakage)    Amlodipine -benazepril 5-20 mg daily   (? Is benazapril causing hair loss)  Toprol xl 25 mg daily - takes prn   (she tried to take it every day and she did ok but was mixing up with changing other medicines)  Hctz 50 mg daily   Did not tolerate spironolactone in the past   BP Readings from Last 3 Encounters:  05/06/22 (!) 154/74  01/05/22 136/72  07/13/21 120/68     Pulse Readings from Last 3 Encounters:  05/06/22 64  01/05/22 62  07/13/21 63     Last metabolic panel Lab Results  Component Value Date   GLUCOSE 100 (H) 01/05/2022   NA 138 01/05/2022   K 4.1 01/05/2022   CL 99 01/05/2022   CO2 28 01/05/2022   BUN 20 01/05/2022   CREATININE 0.77 01/05/2022   GFRNONAA >60 08/03/2020   CALCIUM 9.7 01/05/2022   PHOS 3.2 02/07/2009   PROT 7.1 01/05/2022   ALBUMIN 4.3 01/05/2022   BILITOT 0.4 01/05/2022   ALKPHOS 51 01/05/2022   AST 16 01/05/2022   ALT 13 01/05/2022   ANIONGAP 9 08/03/2020    Prediabetes Lab Results  Component Value Date   HGBA1C 6.5 01/05/2022    Lab Results  Component Value Date   CHOL 206 (H) 02/24/2021   HDL 61.60 02/24/2021   LDLCALC 127 (H) 02/24/2021   LDLDIRECT 107.3 08/11/2009   TRIG 90.0 02/24/2021   CHOLHDL 3 02/24/2021   EKG today NSR with rate of 62    Patient Active Problem List   Diagnosis Date Noted   Atypical chest pain 05/06/2022   Hepatic steatosis 01/05/2022   Crepitus of cervical spine 04/30/2021   Unilateral occipital headache 04/30/2021   Medicare annual wellness visit, subsequent 11/24/2020   Low back pain 10/14/2020   Breast pain 07/22/2020   Insomnia 07/23/2019   Heart rate problem 05/29/2019   Estrogen deficiency 11/27/2018   Encounter for pre-employment examination 11/02/2016   Fungal nail infection 04/27/2016   Sinusitis, chronic 03/12/2015   Fatigue 05/24/2014   Hyperlipidemia 10/30/2013   History of radius fracture 11/23/2012   Undiagnosed cardiac murmurs 11/23/2012   Obesity (BMI 30.0-34.9) 09/14/2011   Family history of colon cancer requiring screening colonoscopy 09/14/2011   Routine general medical examination at a health care facility 09/05/2011   Mixed incontinence urge and stress 04/14/2011   Hemorrhoids, internal, with bleeding 10/12/2010   External hemorrhoids 11/04/2009   ALLERGIC RHINITIS 08/13/2009   Hypokalemia 04/28/2007   Prediabetes 11/15/2006   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   BUNIONS, RIGHT FOOT 07/19/2006   HSV 07/06/2006   Essential hypertension 07/06/2006   URINARY  INCONTINENCE, MIXED 07/06/2006   Past Medical History:  Diagnosis Date   Allergy    Cataract    beginnings of    Glaucoma    Heart murmur    noted on PCP exam 11/23/2012; "does not appear urgent"   Hemorrhoids    Hypertension    under control with meds., has been on med. > 20 yr.   Radius fracture 11/22/2012   left, comminuted   Past Surgical History:  Procedure Laterality Date   ABDOMINAL HYSTERECTOMY  06/02/2000   partial   COLONOSCOPY  03/2006   COLONOSCOPY WITH PROPOFOL  11/15/2011   ENDOMETRIAL ABLATION     OPEN REDUCTION INTERNAL FIXATION (ORIF) DISTAL RADIAL FRACTURE Left 11/29/2012   Procedure: OPEN REDUCTION INTERNAL FIXATION (ORIF) LEFT  DISTAL RADIAL FRACTURE;  Surgeon: Nicki Reaper, MD;   Location: [facility];  Service: Orthopedics;  Laterality: Left;   PARTIAL HYSTERECTOMY     Social History   Tobacco Use   Smoking status: Never   Smokeless tobacco: Never  Vaping Use   Vaping Use: Never used  Substance Use Topics   Alcohol use: No   Drug use: No   Family History  Problem Relation Age of Onset   Hypertension Father    Coronary artery disease Father        with CABG   Dementia Father    Hypertension Mother    Colon cancer Mother    Diabetes Mother    Dementia Mother    Breast cancer Maternal Aunt    Diabetes Maternal Aunt    Colon cancer Maternal Uncle    Colon cancer Paternal Uncle    Colon polyps Sister    Esophageal cancer Neg Hx    Rectal cancer Neg Hx    Stomach cancer Neg Hx    Allergies  Allergen Reactions   Amoxicillin Hives and Swelling   Spironolactone Diarrhea   Augmentin [Amoxicillin-Pot Clavulanate] Diarrhea    Tore stomach up   Shrimp [Shellfish Allergy] Hives and Swelling   Current Outpatient Medications on File Prior to Visit  Medication Sig Dispense Refill   amLODipine-benazepril (LOTREL) 5-20 MG capsule Take 1 capsule by mouth daily. 90 capsule 3   cholecalciferol (VITAMIN D3) 25 MCG (1000 UNIT) tablet Take 1,000 Units by mouth daily.     Cyanocobalamin (B-12) 5000 MCG CAPS Take 1 capsule by mouth every other day.     DENTA 5000 PLUS 1.1 % CREA dental cream SMARTSIG:sparingly By Mouth     diclofenac (VOLTAREN) 75 MG EC tablet Take 1 tablet (75 mg total) by mouth 2 (two) times daily. Bid prn pain 60 tablet 2   fluticasone (FLONASE) 50 MCG/ACT nasal spray Place 2 sprays into both nostrils daily as needed for allergies or rhinitis. 48 g 3   hydrochlorothiazide (HYDRODIURIL) 50 MG tablet Take 1 tablet (50 mg total) by mouth daily. 90 tablet 3   hydrocortisone (ANUCORT-HC) 25 MG suppository UNWRAP AND INSERT ONE SUPPOSITORY RECTALLY EVERY NIGHT AT BEDTIME AS NEEDED FOR HEMORRHOIDS 12 suppository 0   hydrocortisone  (ANUSOL-HC) 2.5 % rectal cream Apply to affected area on hemorrhoids once daily as needed 30 g 0   hydrOXYzine (ATARAX/VISTARIL) 25 MG tablet Take 1-2 tablets (25-50 mg total) by mouth at bedtime as needed. 12 tablet 0   latanoprost (XALATAN) 0.005 % ophthalmic solution Place 1 drop into both eyes.      metoprolol succinate (TOPROL-XL) 25 MG 24 hr tablet Take 1 tablet (25 mg total) by mouth  daily. (Patient taking differently: Take 25 mg by mouth daily as needed.) 90 tablet 3   Multiple Vitamin (MULTIVITAMIN) tablet Take 1 tablet by mouth daily.       NON FORMULARY Diltiazem 2%/Lidocaine compound Use 3 x rectally daily for 2 months to heal anal fissure 30 g 1   Omega-3 Fatty Acids (FISH OIL CONCENTRATE PO) Take 1 capsule by mouth daily.      potassium chloride (KLOR-CON) 10 MEQ tablet TAKE 1 TABLET BY MOUTH EVERY DAY 90 tablet 2   No current facility-administered medications on file prior to visit.     Review of Systems  Constitutional:  Positive for fatigue. Negative for activity change, appetite change, fever and unexpected weight change.  HENT:  Negative for congestion, ear pain, rhinorrhea, sinus pressure and sore throat.   Eyes:  Negative for pain, redness and visual disturbance.  Respiratory:  Negative for cough, shortness of breath and wheezing.   Cardiovascular:  Positive for chest pain. Negative for palpitations and leg swelling.       Chest pressure occ L  Not exertional  Gastrointestinal:  Negative for abdominal pain, blood in stool, constipation and diarrhea.  Endocrine: Negative for polydipsia and polyuria.  Genitourinary:  Negative for dysuria, frequency and urgency.  Musculoskeletal:  Negative for arthralgias, back pain and myalgias.  Skin:  Negative for pallor and rash.  Allergic/Immunologic: Negative for environmental allergies.  Neurological:  Negative for dizziness, syncope and headaches.  Hematological:  Negative for adenopathy. Does not bruise/bleed easily.   Psychiatric/Behavioral:  Negative for decreased concentration and dysphoric mood. The patient is nervous/anxious.        Objective:   Physical Exam Constitutional:      General: She is not in acute distress.    Appearance: Normal appearance. She is well-developed. She is not ill-appearing or diaphoretic.  HENT:     Head: Normocephalic and atraumatic.     Mouth/Throat:     Mouth: Mucous membranes are moist.  Eyes:     General: No scleral icterus.    Conjunctiva/sclera: Conjunctivae normal.     Pupils: Pupils are equal, round, and reactive to light.  Neck:     Thyroid: No thyromegaly.     Vascular: No carotid bruit or JVD.  Cardiovascular:     Rate and Rhythm: Normal rate and regular rhythm.     Heart sounds: Murmur heard.     No gallop.  Pulmonary:     Effort: Pulmonary effort is normal. No respiratory distress.     Breath sounds: Normal breath sounds. No stridor. No wheezing, rhonchi or rales.     Comments: No crackles or rales Abdominal:     General: There is no distension or abdominal bruit.     Palpations: Abdomen is soft. There is no mass.     Tenderness: There is no abdominal tenderness.     Hernia: No hernia is present.  Musculoskeletal:     Cervical back: Normal range of motion and neck supple.     Right lower leg: No edema.     Left lower leg: No edema.     Comments: No leg tenderness or swelling  Lymphadenopathy:     Cervical: No cervical adenopathy.  Skin:    General: Skin is warm and dry.     Coloration: Skin is not pale.     Findings: No rash.  Neurological:     Mental Status: She is alert.     Coordination: Coordination normal.     Deep  Tendon Reflexes: Reflexes are normal and symmetric. Reflexes normal.  Psychiatric:        Mood and Affect: Mood normal.           Assessment & Plan:   Problem List Items Addressed This Visit       Cardiovascular and Mediastinum   Essential hypertension - Primary    Bp is not at goal /not controlled   BP:  (!) 154/74  Taking Amlodipine -benazepril 5-0 mg daily  (is unsure if this causes hair loss and breakage) Toprol xl 25 mg = takes prn (unsure why ste stopped taking daily in the past)  Hctz 50 mg daily  Was working on wt loss and fell out of good habits Encouraged good health habits  Notes occ L chest pressure as well   Plan Ref to cardiology Ref to pharmacy for help with med management       Relevant Orders   EKG 12-Lead (Completed)   AMB Referral to Pharmacy Medication Management   Ambulatory referral to Cardiology     Digestive   Hepatic steatosis    Stable, liver tests were in normal range Encouraged to keep working on weight loss Lab Results  Component Value Date   ALT 13 01/05/2022   AST 16 01/05/2022   ALKPHOS 51 01/05/2022   BILITOT 0.4 01/05/2022          Other   Atypical chest pain    Pt has occ L sided chest pressure sensation  This is new  Not exertional  No sob/ nausea or other symptoms [facility] at [facility] a CT showed poss CAD (? If it was a coronary ca score) Plan to refer to cardiology Discussed improvement of lipid and bp control  Er precautions discussed in detail       Relevant Orders   EKG 12-Lead (Completed)   Ambulatory referral to Cardiology   Class 2 severe obesity due to excess calories with serious comorbidity and body mass index (BMI) of 35.0 to 35.9 in adult [facility])    Not in control but down 4 lb from last summer Discussed how this problem influences overall health and the risks it imposes  Reviewed plan for weight loss with lower calorie diet (via better food choices and also portion control or program like weight watchers) and exercise building up to or more than 30 minutes 5 days per week including some aerobic activity  Encouraged low glycemic diet (A1c is down)  Encouraged to return to the NOOM program if she thinks she can stick to it  ? If some emotional eating component       Hyperlipidemia    Last LDL 127-not optimal Pt is  hesitant to start medication and controls with diet so far  Per pt - ? Some evidence of CAD on a CT (we are trying to get report)   Discussed low trans and sat fat eating  Needs a statin in future / at least a trial but she is worried about hair loss  Normal EKG today Planning cardiology referral      Relevant Orders   AMB Referral to Pharmacy Medication Management   Ambulatory referral to Cardiology   Prediabetes    Lab Results  Component Value Date   HGBA1C 5.8 (A) 05/06/2022  Improved from 6.5  Encouraged her to get back on track with lower glycemic diet      Relevant Orders   POCT glycosylated hemoglobin (Hb A1C) (Completed)   Other Visit  Diagnoses     Obesity (BMI 30.0-34.9)

## 2022-05-08 NOTE — Assessment & Plan Note (Signed)
Not in control but down 4 lb from last summer Discussed how this problem influences overall health and the risks it imposes  Reviewed plan for weight loss with lower calorie diet (via better food choices and also portion control or program like weight watchers) and exercise building up to or more than 30 minutes 5 days per week including some aerobic activity  Encouraged low glycemic diet (A1c is down)  Encouraged to return to the NOOM program if she thinks she can stick to it  ? If some emotional eating component

## 2022-05-08 NOTE — Assessment & Plan Note (Signed)
Lab Results  Component Value Date   HGBA1C 5.8 (A) 05/06/2022   Improved from 6.5  Encouraged her to get back on track with lower glycemic diet

## 2022-05-08 NOTE — Assessment & Plan Note (Signed)
Pt has occ L sided chest pressure sensation  This is new  Not exertional  No sob/ nausea or other symptoms [facility] CT showed poss CAD (? If it was a coronary ca score) Plan to refer to cardiology Discussed improvement of lipid and bp control  Er precautions discussed in detail

## 2022-05-08 NOTE — Assessment & Plan Note (Addendum)
Discussed how this problem influences overall health and the risks it imposes  Reviewed plan for weight loss with lower calorie diet (via better food choices and also portion control or program like weight watchers) and exercise building up to or more than 30 minutes 5 days per week including some aerobic activity   Tried NOOM program- this was a lot of effort but she is considering returing

## 2022-05-08 NOTE — Assessment & Plan Note (Signed)
Last LDL 127-not optimal Pt is hesitant to start medication and controls with diet so far  Per pt - ? Some evidence of CAD on a CT (we are trying to get report)   Discussed low trans and sat fat eating  Needs a statin in future / at least a trial but she is worried about hair loss  Normal EKG today Planning cardiology referral

## 2022-05-08 NOTE — Assessment & Plan Note (Signed)
Stable, liver tests were in normal range Encouraged to keep working on weight loss Lab Results  Component Value Date   ALT 13 01/05/2022   AST 16 01/05/2022   ALKPHOS 51 01/05/2022   BILITOT 0.4 01/05/2022

## 2022-05-08 NOTE — Assessment & Plan Note (Signed)
Bp is not at goal /not controlled   BP: (!) 154/74  Taking Amlodipine -benazepril 5-0 mg daily  (is unsure if this causes hair loss and breakage) Toprol xl 25 mg = takes prn (unsure why ste stopped taking daily in the past)  Hctz 50 mg daily  Was working on wt loss and fell out of good habits Encouraged good health habits  Notes occ L chest pressure as well   Plan Ref to cardiology Ref to pharmacy for help with med management

## 2022-05-17 ENCOUNTER — Telehealth: Payer: Self-pay

## 2022-05-17 NOTE — Progress Notes (Unsigned)
   Care Guide Note  05/17/2022 Name: Rebecca [name] MRN: [id number] DOB: Sep 25, 1955  Referred by: Tower, Audrie Gallus, MD Reason for referral : Care Coordination (Outreach to schedule with Pharm d )   Rebecca [name] is a 67 y.o. year old female who is a primary care patient of Tower, Audrie Gallus, MD. Rebecca [name] was referred to the pharmacist for assistance related to HTN.    An unsuccessful telephone outreach was attempted today to contact the patient who was referred to the pharmacy team for assistance with medication management. Additional attempts will be made to contact the patient.   Penne Lash, RMA Care Guide [facility]  [place], [place] [address] Direct Dial: [phone] [email or web address]

## 2022-05-18 NOTE — Progress Notes (Signed)
   Care Guide Note  05/18/2022 Name: Rebecca [name] MRN: [id number] DOB: 07-01-1955  Referred by: Tower, Audrie Gallus, MD Reason for referral : Care Coordination (Outreach to schedule with Pharm d )   Rebecca [name] is a 67 y.o. year old female who is a primary care patient of Tower, Audrie Gallus, MD. Rebecca [name] was referred to the pharmacist for assistance related to HTN.    Successful contact was made with the patient to discuss pharmacy services including being ready for the pharmacist to call at least 5 minutes before the scheduled appointment time, to have medication bottles and any blood sugar or blood pressure readings ready for review. The patient agreed to meet with the pharmacist via with the pharmacist via telephone visit on (date/time).  06/01/2022  Penne Lash, RMA Care Guide [facility]  [place], [place] [address] Direct Dial: [phone] [email or web address]

## 2022-05-24 ENCOUNTER — Encounter (INDEPENDENT_AMBULATORY_CARE_PROVIDER_SITE_OTHER): Payer: Self-pay

## 2022-06-01 ENCOUNTER — Other Ambulatory Visit: Payer: BC Managed Care – PPO | Admitting: Pharmacist

## 2022-06-01 NOTE — Progress Notes (Signed)
06/01/2022 Name: Rebecca [name] MRN: [id number] DOB: September 10, 1955  Chief Complaint  Patient presents with   Medication Management   Hypertension    Rebecca [name] is a 67 y.o. year old female who presented for a telephone visit.   They were referred to the pharmacist by their PCP for assistance in managing hypertension.   Subjective:  Care Team: Primary Care Provider: Judy Pimple, MD ; Next Scheduled Visit: 08/06/22 Cardiologist: Azucena Cecil; Next Scheduled Visit: 06/03/22  Medication Access/Adherence  Current Pharmacy:  [id number] Nicholes Rough, [facility] - [address] [place] [address] Phone: [phone] Fax: [phone]  [id number] - [place], [place] - [address] [place] [address] Phone: [phone] Fax: [phone]   Patient reports affordability concerns with their medications: No  Patient reports access/transportation concerns to their pharmacy: No  Patient reports adherence concerns with their medications:  No    Mychart Amb Medication Adherence Questionnaire   05/31/2022  6:57 PM EDT - [name] by Patient  During the past 2 weeks, have you missed any doses of your medication(s)? No  During the past 2 weeks, have you missed any doses of your medications because you could not afford them? No  During the past 2 weeks, have you missed any doses of your medications because you could not get to the pharmacy? No  During the past 2 weeks, have you missed any doses of your medications because you could not get refills? No  During the past 2 weeks, have you missed any doses of your medication because you do not know what the medication is for? No  During the past 2 weeks, have you missed any doses of your medications because your medications make you feel sick? Yes  During the past 2 weeks, have you missed any doses of your medications because you have too many medications? No  During the past 2 weeks, have you  missed any doses of your medications because you forgot to take your medications? No  During the past 2 weeks, have you missed any doses of your medications because you feel fine and do not think you need the medication(s)? No  Have you missed doses of your medications in the past 2 weeks for any other reason not listed above? No     Originally diagnosed in her 30s  Hypertension:  Current medications: amlodipine/benazepril 5/20 mg daily, HCTZ 50 mg daily, metoprolol succinate 25 mg daily- reports she stopped due to symptoms of fatigue, lightheadedness, and lower blood pressure (SBP ~110)   Notes she feels her hair has been thinner and falling out more since she was started on amlodipine/benazepril. She does not think she has ever been on either agent separately. There is not an incidence of hair loss mentioned in either package insert. She is very concerned today about the hair thinning, even in the setting of supplementing with biotin and collagen.  Patient has a validated, automated, upper arm home BP cuff  Patient denies hypotensive s/sx including dizziness, lightheadedness since stopping metoprolol  Patient denies hypertensive symptoms including headache, chest pain, shortness of breath  She is additionally on potassium supplementation, which could be related to high HCTZ dose.   Chart reveals a history of "heart rate problem" and "undiagnosed murmurs" but patient notes her recent EKGs have been normal and HR remains well controlled at home without metoprolol (<70 at rest). Denies palpitations.   Hyperlipidemia/ASCVD Risk Reduction  Current lipid lowering medications: none  Antiplatelet regimen: none  ASCVD History: reports prior imaging mentioned some plaque build up Family History: mother passed away from a MI at age 86; father had CABG (unsure how old he was, but thinks he was older   PREVENT Risk Score: 10 year risk of CVD: 7.2% - 10 year risk of ASCVD: 4.4% - 10 year risk  of HF: 4.9%   Objective:  Lab Results  Component Value Date   HGBA1C 5.8 (A) 05/06/2022    Lab Results  Component Value Date   CREATININE 0.77 01/05/2022   BUN 20 01/05/2022   NA 138 01/05/2022   K 4.1 01/05/2022   CL 99 01/05/2022   CO2 28 01/05/2022    Lab Results  Component Value Date   CHOL 206 (H) 02/24/2021   HDL 61.60 02/24/2021   LDLCALC 127 (H) 02/24/2021   LDLDIRECT 107.3 08/11/2009   TRIG 90.0 02/24/2021   CHOLHDL 3 02/24/2021    Medications Reviewed Today     Reviewed by Alden Hipp, RPH-CPP (Pharmacist) on 06/01/22 at 1013  Med List Status: <None>   Medication Order Taking? Sig Documenting Provider Last Dose Status Informant  amLODipine-benazepril (LOTREL) 5-20 MG capsule [id number] Yes Take 1 capsule by mouth daily. Tower, Audrie Gallus, MD Taking Active   BIOTIN PO [id number] Yes Take 1 tablet by mouth daily. [provider] Taking Active   cholecalciferol (VITAMIN D3) 25 MCG (1000 UNIT) tablet [id number] Yes Take 1,000 Units by mouth daily. [provider] Taking Active   Cyanocobalamin (B-12) 5000 MCG CAPS [id number] Yes Take 1 capsule by mouth every other day. [provider] Taking Active   diclofenac (VOLTAREN) 75 MG EC tablet [id number] Yes Take 1 tablet (75 mg total) by mouth 2 (two) times daily. Bid prn pain Cristie Hem, PA-C Taking Active   fluticasone (FLONASE) 50 MCG/ACT nasal spray [id number] No Place 2 sprays into both nostrils daily as needed for allergies or rhinitis.  Patient not taking: Reported on 06/01/2022   Judy Pimple, MD Not Taking Active   hydrochlorothiazide (HYDRODIURIL) 50 MG tablet [id number] Yes Take 1 tablet (50 mg total) by mouth daily. Tower, Audrie Gallus, MD Taking Active   hydrocortisone (ANUCORT-HC) 25 MG suppository [id number] Yes UNWRAP AND INSERT ONE SUPPOSITORY RECTALLY EVERY NIGHT AT BEDTIME AS NEEDED FOR HEMORRHOIDS Doreene Nest, NP Taking Active   hydrocortisone (ANUSOL-HC) 2.5 % rectal  cream [id number] Yes Apply to affected area on hemorrhoids once daily as needed Doreene Nest, NP Taking Active   latanoprost (XALATAN) 0.005 % ophthalmic solution [id number] Yes Place 1 drop into both eyes.  [provider] Taking Active            Med Note Laretta Bolster, [facility]   Fri Oct 10, 2015  8:08 AM)    magnesium oxide (MAG-OX) 400 (240 Mg) MG tablet [id number] Yes Take 400 mg by mouth daily. [provider] Taking Active   metoprolol succinate (TOPROL-XL) 25 MG 24 hr tablet [id number] No Take 1 tablet (25 mg total) by mouth daily.  Patient not taking: Reported on 06/01/2022   Judy Pimple, MD Not Taking Active   Multiple Vitamin (MULTIVITAMIN) tablet [id number] Yes Take 1 tablet by mouth daily.   [provider] Taking Active   Omega-3 Fatty Acids (FISH OIL CONCENTRATE PO) [id number] Yes Take 1 capsule by mouth daily.  [provider] Taking Active   potassium chloride (KLOR-CON) 10 MEQ tablet [id number] Yes TAKE 1 TABLET BY MOUTH EVERY DAY Tower, Audrie Gallus, MD Taking Active  Assessment/Plan:   Hypertension: - Currently controlled per home reading today after medications, but with opportunity for regimen optimization. - Reviewed long term cardiovascular and renal outcomes of uncontrolled blood pressure - Reviewed appropriate blood pressure monitoring technique and reviewed goal blood pressure. Recommended to check home blood pressure and heart rate twice daily, before medications and after medications, and provide readings to cardiology.  - As patient has an appointment with cardiology in 2 days, will avoid changes at this time. Could consider several options, including reducing HCTZ to 25 mg daily, consider separating amlodipine and benazepril to allow for separate adjustment. Unclear if compelling indication for beta blocker therapy, especially given the fatigue patient experienced with metoprolol succinate 25 mg daily. Will defer initial  changes to cardiology and follow up   Hyperlipidemia/ASCVD Risk Reduction: - Currently untreated.  - Reviewed long term complications of uncontrolled cholesterol - Reviewed dietary recommendations including: moderating consumption of fatty foods, such as red meats.  - Recommend to discuss with cardiology. Given family history, PREVENT risk score, and prior A1c of 6.5% could consider moderate intensity statin. Could also consider coronary artery calcium scoring.   Follow Up Plan: phone call in 3 weeks  Catie TClearance Coots, PharmD, BCACP, CPP [facility] [phone]

## 2022-06-01 NOTE — Patient Instructions (Signed)
Hi Darlen,   Check your blood pressure twice daily, and any time you have concerning symptoms like headache, chest pain, dizziness, shortness of breath, or vision changes.   Our goal is less than 130/80.  To appropriately check your blood pressure, make sure you do the following:  1) Avoid caffeine, exercise, or tobacco products for 30 minutes before checking. Empty your bladder. 2) Sit with your back supported in a flat-backed chair. Rest your arm on something flat (arm of the chair, table, etc). 3) Sit still with your feet flat on the floor, resting, for at least 5 minutes.  4) Check your blood pressure. Take 1-2 readings.  5) Write down these readings and bring with you to any provider appointments.  Bring your home blood pressure machine with you to a provider's office for accuracy comparison at least once a year.   Make sure you take your blood pressure medications before you come to any office visit, even if you were asked to fast for labs.   Catie Eppie Gibson, PharmD, BCACP, CPP [facility] [phone]

## 2022-06-02 ENCOUNTER — Telehealth: Payer: Self-pay | Admitting: Family Medicine

## 2022-06-02 NOTE — Telephone Encounter (Signed)
From pharmacy  Message Received: Rebecca [name], Rebecca [name], RPH-CPP  Rebecca [name], Rebecca Gallus, MD; Rebecca Odea, MD Rebecca [name],  This patient of Rebecca [name] establishes with you on Thursday, so I deferred making any antihypertensive changes today. Reports she believes amlodipine/benazepril causes hair thinning, has stopped metoprolol succinate because of symptomatic fatigue and hypotension when taking, and is currently on HCTZ 50 mg daily and needing potassium supplementation. Would appreciate your input on whether she does have a compelling indication for beta blocker and any therapy recommendations. BP was at goal per home monitor during our appointment (~4 hours after amlo/benaz + HCTZ), but she is still worried about the hair thinning.    We also talked about ASCVD risk given family history. Last lipids were in 2023, I prepped her that you may want to check those, and may consider something like CAC score or other testing to evaluate risk.  I plan to call her in 3 weeks to f/u on any changes you make.  Thanks, all! Rebecca [name], PharmD     Reviewed Please schedule pt a f/u with me when I am back in town for blood pressure

## 2022-06-02 NOTE — Telephone Encounter (Signed)
In your inbox for review °

## 2022-06-02 NOTE — Telephone Encounter (Signed)
Patient came by and dropped off medical records from her previous PCP. Left in providers folder upfront.

## 2022-06-03 ENCOUNTER — Encounter: Payer: Self-pay | Admitting: Cardiology

## 2022-06-03 ENCOUNTER — Ambulatory Visit: Payer: BC Managed Care – PPO | Attending: Cardiology | Admitting: Cardiology

## 2022-06-03 ENCOUNTER — Ambulatory Visit: Payer: BC Managed Care – PPO | Admitting: Physician Assistant

## 2022-06-03 VITALS — BP 138/78 | HR 60 | Ht 67.0 in | Wt 216.8 lb

## 2022-06-03 DIAGNOSIS — I10 Essential (primary) hypertension: Secondary | ICD-10-CM

## 2022-06-03 DIAGNOSIS — L659 Nonscarring hair loss, unspecified: Secondary | ICD-10-CM | POA: Diagnosis not present

## 2022-06-03 MED ORDER — BENAZEPRIL HCL 20 MG PO TABS: 20.0000 mg | ORAL_TABLET | Freq: Every day | ORAL | 0 refills | Status: DC

## 2022-06-03 NOTE — Telephone Encounter (Signed)
Left VM requesting pt to call the office back.  **if pt calls back she just needs a BP f/u per care cor.(Pharmacy)**

## 2022-06-03 NOTE — Patient Instructions (Signed)
Medication Instructions:   1. STOP amLODipine-benazepril (LOTREL) 5-20 MG capsule  2. START Benazepril - Take one tablet (20mg ) by mouth daily.   *If you need a refill on your cardiac medications before your next appointment, please call your pharmacy*   Lab Work:  None Ordered  If you have labs (blood work) drawn today and your tests are completely normal, you will receive your results only by: MyChart Message (if you have MyChart) OR A paper copy in the mail If you have any lab test that is abnormal or we need to change your treatment, we will call you to review the results.   Testing/Procedures:  None Ordered    Follow-Up: At [facility], you and your health needs are our priority.  As part of our continuing mission to provide you with exceptional heart care, we have created designated Provider Care Teams.  These Care Teams include your primary Cardiologist (physician) and Advanced Practice Providers (APPs -  Physician Assistants and Nurse Practitioners) who all work together to provide you with the care you need, when you need it.  We recommend signing up for the patient portal called "MyChart".  Sign up information is provided on this After Visit Summary.  MyChart is used to connect with patients for Virtual Visits (Telemedicine).  Patients are able to view lab/test results, encounter notes, upcoming appointments, etc.  Non-urgent messages can be sent to your provider as well.   To learn more about what you can do with MyChart, go to [email or web address].    Your next appointment:   6 week(s)  Provider:   You may see Debbe Odea, MD or one of the following Advanced Practice Providers on your designated Care Team:   Nicolasa Ducking, NP Eula Listen, PA-C Cadence Fransico Michael, PA-C Charlsie Quest, NP

## 2022-06-03 NOTE — Progress Notes (Signed)
Cardiology Office Note:    Date:  06/03/2022   ID:  Rebecca [name], DOB 02-Jul-1955, MRN [id number]  PCP:  Judy Pimple, MD   [facility] Providers Cardiologist:  Debbe Odea, MD     Referring MD: Judy Pimple, MD   Chief Complaint  Patient presents with   New Patient (Initial Visit)    Evaluation for hypertension.  Is concerned current medication Lotrel may be causing hair loss.   Rebecca [name] is a 67 y.o. female who is being seen today for the evaluation of hypertension at the request of Tower, Audrie Gallus, MD.   History of Present Illness:    Rebecca [name] is a 67 y.o. female with a hx of hypertension who presents for hypertension management.  Diagnosed with hypertension about 20 years ago, has been on hydrochlorothiazide since.  Has several allergies to BP meds, including Aldactone causing diarrhea, metoprolol succinate caused fatigue and hypotension, recently prescribed amlodipine/benazepril cause hair thinning.  Thinks amlodipine is what has caused her hair loss.  Would like to come off amlodipine.  States not having any adverse effects/hair loss with taking hydrochlorothiazide.  Denies chest pain or shortness of breath, no concerns at this time.  Past Medical History:  Diagnosis Date   Allergy    Cataract    beginnings of    Glaucoma    Heart murmur    noted on PCP exam 11/23/2012; "does not appear urgent"   Hemorrhoids    Hypertension    under control with meds., has been on med. > 20 yr.   Radius fracture 11/22/2012   left, comminuted    Past Surgical History:  Procedure Laterality Date   ABDOMINAL HYSTERECTOMY  06/02/2000   partial   COLONOSCOPY  03/2006   COLONOSCOPY WITH PROPOFOL  11/15/2011   ENDOMETRIAL ABLATION     OPEN REDUCTION INTERNAL FIXATION (ORIF) DISTAL RADIAL FRACTURE Left 11/29/2012   Procedure: OPEN REDUCTION INTERNAL FIXATION (ORIF) LEFT  DISTAL RADIAL FRACTURE;  Surgeon: Nicki Reaper, MD;  Location: [facility];   Service: Orthopedics;  Laterality: Left;   PARTIAL HYSTERECTOMY      Current Medications: Current Meds  Medication Sig   benazepril (LOTENSIN) 20 MG tablet Take 1 tablet (20 mg total) by mouth daily.   BIOTIN PO Take 1 tablet by mouth daily.   cholecalciferol (VITAMIN D3) 25 MCG (1000 UNIT) tablet Take 1,000 Units by mouth daily.   Cyanocobalamin (B-12) 5000 MCG CAPS Take 1 capsule by mouth every other day.   diclofenac (VOLTAREN) 75 MG EC tablet Take 1 tablet (75 mg total) by mouth 2 (two) times daily. Bid prn pain   hydrochlorothiazide (HYDRODIURIL) 50 MG tablet Take 1 tablet (50 mg total) by mouth daily.   hydrocortisone (ANUCORT-HC) 25 MG suppository UNWRAP AND INSERT ONE SUPPOSITORY RECTALLY EVERY NIGHT AT BEDTIME AS NEEDED FOR HEMORRHOIDS   hydrocortisone (ANUSOL-HC) 2.5 % rectal cream Apply to affected area on hemorrhoids once daily as needed   latanoprost (XALATAN) 0.005 % ophthalmic solution Place 1 drop into both eyes.    magnesium oxide (MAG-OX) 400 (240 Mg) MG tablet Take 400 mg by mouth daily.   Multiple Vitamin (MULTIVITAMIN) tablet Take 1 tablet by mouth daily.     Omega-3 Fatty Acids (FISH OIL CONCENTRATE PO) Take 1 capsule by mouth daily.    potassium chloride (KLOR-CON) 10 MEQ tablet TAKE 1 TABLET BY MOUTH EVERY DAY   [DISCONTINUED] amLODipine-benazepril (LOTREL) 5-20 MG capsule Take 1 capsule by mouth daily.  Allergies:   Amoxicillin, Spironolactone, Augmentin [amoxicillin-pot clavulanate], and Shrimp [shellfish allergy]   Social History   Socioeconomic History   Marital status: Divorced    Spouse name: Not on file   Number of children: 2   Years of education: Not on file   Highest education level: Not on file  Occupational History   Occupation: Engineer, site, also going to school for teaching degree   Occupation: retired  Tobacco Use   Smoking status: Never   Smokeless tobacco: Never  Vaping Use   Vaping Use: Never used  Substance and Sexual Activity    Alcohol use: No   Drug use: No   Sexual activity: Not on file  Other Topics Concern   Not on file  Social History Narrative   Regular exercise   Social Determinants of Health   Financial Resource Strain: Not on file  Food Insecurity: Not on file  Transportation Needs: Not on file  Physical Activity: Not on file  Stress: Not on file  Social Connections: Not on file     Family History: The patient's family history includes Breast cancer in her maternal aunt; Colon cancer in her maternal uncle, mother, and paternal uncle; Colon polyps in her sister; Coronary artery disease in her father; Dementia in her father and mother; Diabetes in her maternal aunt and mother; Heart attack in her mother; Heart disease in her father and mother; Hypertension in her father and mother. There is no history of Esophageal cancer, Rectal cancer, or Stomach cancer.  ROS:   Please see the history of present illness.     All other systems reviewed and are negative.  EKGs/Labs/Other Studies Reviewed:    The following studies were reviewed today:   EKG:  EKG is  ordered today.  The ekg ordered today demonstrates   Recent Labs: 01/05/2022: ALT 13; BUN 20; Creatinine, Ser 0.77; Potassium 4.1; Sodium 138  Recent Lipid Panel    Component Value Date/Time   CHOL 206 (H) 02/24/2021 0924   TRIG 90.0 02/24/2021 0924   HDL 61.60 02/24/2021 0924   CHOLHDL 3 02/24/2021 0924   VLDL 18.0 02/24/2021 0924   LDLCALC 127 (H) 02/24/2021 0924   LDLDIRECT 107.3 08/11/2009 0817     Risk Assessment/Calculations:             Physical Exam:    VS:  BP 138/78 (BP Location: Right Arm, Patient Position: Sitting, Cuff Size: Large)   Pulse 60   Ht 5\' 7"  (1.702 m)   Wt 216 lb 12.8 oz (98.3 kg)   SpO2 99%   BMI 33.96 kg/m     Wt Readings from Last 3 Encounters:  06/03/22 216 lb 12.8 oz (98.3 kg)  05/06/22 221 lb 2 oz (100.3 kg)  01/05/22 223 lb (101.2 kg)     GEN:  Well nourished, well developed in no acute  distress HEENT: Normal NECK: No JVD; No carotid bruits CARDIAC: RRR, no murmurs, rubs, gallops RESPIRATORY:  Clear to auscultation without rales, wheezing or rhonchi  ABDOMEN: Soft, non-tender, non-distended MUSCULOSKELETAL:  No edema; No deformity  SKIN: Warm and dry NEUROLOGIC:  Alert and oriented x 3 PSYCHIATRIC:  Normal affect   ASSESSMENT:    1. Primary hypertension   2. Hair loss    PLAN:    In order of problems listed above:  Hypertension, patient is adamant about amlodipine causing hair loss.  Stop Lotrel (benazepril amlodipine) due to concerns for hair loss.  Prescribed benazepril 20mg , continue HCTZ 50 mg  daily.  Check BP and keep log.  Monitor symptoms of hair loss, if hair loss persists despite taking off amlodipine, will restart amlodipine at follow-up visit and stop benazepril since ACE inhibitors have also been attributed to hair loss, less so calcium channel blockers. Possible hair loss from medication side effect.  Management as above.  Follow-up in 4 to 6 weeks     Medication Adjustments/Labs and Tests Ordered: Current medicines are reviewed at length with the patient today.  Concerns regarding medicines are outlined above.  Orders Placed This Encounter  Procedures   EKG 12-Lead   Meds ordered this encounter  Medications   benazepril (LOTENSIN) 20 MG tablet    Sig: Take 1 tablet (20 mg total) by mouth daily.    Dispense:  90 tablet    Refill:  0    Patient Instructions  Medication Instructions:   1. STOP amLODipine-benazepril (LOTREL) 5-20 MG capsule  2. START Benazepril - Take one tablet (20mg ) by mouth daily.   *If you need a refill on your cardiac medications before your next appointment, please call your pharmacy*   Lab Work:  None Ordered  If you have labs (blood work) drawn today and your tests are completely normal, you will receive your results only by: MyChart Message (if you have MyChart) OR A paper copy in the mail If you have any  lab test that is abnormal or we need to change your treatment, we will call you to review the results.   Testing/Procedures:  None Ordered    Follow-Up: At [facility], you and your health needs are our priority.  As part of our continuing mission to provide you with exceptional heart care, we have created designated Provider Care Teams.  These Care Teams include your primary Cardiologist (physician) and Advanced Practice Providers (APPs -  Physician Assistants and Nurse Practitioners) who all work together to provide you with the care you need, when you need it.  We recommend signing up for the patient portal called "MyChart".  Sign up information is provided on this After Visit Summary.  MyChart is used to connect with patients for Virtual Visits (Telemedicine).  Patients are able to view lab/test results, encounter notes, upcoming appointments, etc.  Non-urgent messages can be sent to your provider as well.   To learn more about what you can do with MyChart, go to [email or web address].    Your next appointment:   6 week(s)  Provider:   You may see Debbe Odea, MD or one of the following Advanced Practice Providers on your designated Care Team:   Nicolasa Ducking, NP Eula Listen, PA-C Cadence Fransico Michael, PA-C Charlsie Quest, NP    Signed, Debbe Odea, MD  06/03/2022 10:46 AM    [facility]

## 2022-06-07 NOTE — Progress Notes (Unsigned)
06/08/2022 [name] [id number] Oct 08, 1955  Referring provider: Judy Pimple, MD Primary GI doctor: Dr. Rhea Belton ( previous Dr. Jarold Motto)  ASSESSMENT AND PLAN:   External hemorrhoids with rectal pain, history of anal fissure Sitz bath, fiber, squatty potty No evidence of internal hemorrhoids on colonoscopy or with examination today, no evidence of fissure, small posterior hemorrhoid Continue hydrocortisone cream Will give recticare samples for pain as needed Consider pelvic floor PT Will refer to general surgery for evaluation of external hemorrhoid/fissure repair with associated pain.  Will discuss further with Dr. Margretta Sidle.   Personal history of TA polyps and family history of colon cancer 07/13/2021 colonoscopy with Dr. Rhea Belton - One 6 mm polyp in the transverse colon, removed with a cold snare. Resected and retrieved. - External hemorrhoids- small. - The examination was otherwise normal. Recall 5 years due to FM HX.    Patient Care Team: Tower, Audrie Gallus, MD as PCP - General (Family Medicine) Debbe Odea, MD as PCP - Cardiology (Cardiology)  HISTORY OF PRESENT ILLNESS: 67 y.o. female with a past medical history of hyperlipidemia, obesity, prediabetes, hypertension, urinary stress incontinence, family history of colon cancer in maternal uncle and paternal uncle, history of hemorrhoids presents for hemorrhoids.  11/15/2011 colonoscopy normal colonoscopy random biopsies for microscopic colitis negative 11/23/2016 colonoscopy excellent bowel prep normal mucosa, anal papula were hypertrophied no specimens collected.  Discussed of anorectal prolapse symptoms with intermittent bleeding continued could do hemorrhoidal banding recall 5 years (11/2021) 07/13/2021 colonoscopy with Dr. Rhea Belton - One 6 mm polyp in the transverse colon, removed with a cold snare. Resected and retrieved. - External hemorrhoids- small. - The examination was otherwise normal. Recall 5 years due to FM  HX.  Patient presents for follow up for her hemorrhoids.  She states her hemorrhoids are getting worse, she is having rectal discomfort and throbbing, burning, itching or at times Sharp rectal pain.  Can be once a month, can be during the night while she is sleeping.  Discomfort comes and goes, not worse after BM.  She has BM daily, regular per patient, soft, has feeling of complete BM.  Worse if she is walking a lot or very active.  Can have large volume bleeding with it.  Will use cream as needed when they flare.  She id on diclofenac 75 mg rare for back pain.   Current Medications:    Current Outpatient Medications (Cardiovascular):    benazepril (LOTENSIN) 20 MG tablet, Take 1 tablet (20 mg total) by mouth daily.   hydrochlorothiazide (HYDRODIURIL) 50 MG tablet, Take 1 tablet (50 mg total) by mouth daily.  Current Outpatient Medications (Respiratory):    fluticasone (FLONASE) 50 MCG/ACT nasal spray, Place 2 sprays into both nostrils daily as needed for allergies or rhinitis.  Current Outpatient Medications (Analgesics):    diclofenac (VOLTAREN) 75 MG EC tablet, Take 1 tablet (75 mg total) by mouth 2 (two) times daily. Bid prn pain  Current Outpatient Medications (Hematological):    Cyanocobalamin (B-12) 5000 MCG CAPS, Take 1 capsule by mouth every other day.  Current Outpatient Medications (Other):    BIOTIN PO, Take 1 tablet by mouth daily.   cholecalciferol (VITAMIN D3) 25 MCG (1000 UNIT) tablet, Take 1,000 Units by mouth daily.   hydrocortisone (ANUCORT-HC) 25 MG suppository, UNWRAP AND INSERT ONE SUPPOSITORY RECTALLY EVERY NIGHT AT BEDTIME AS NEEDED FOR HEMORRHOIDS   hydrocortisone (ANUSOL-HC) 2.5 % rectal cream, Apply to affected area on hemorrhoids once daily as needed   latanoprost (XALATAN)  0.005 % ophthalmic solution, Place 1 drop into both eyes.    lidocaine (XYLOCAINE) 2 % jelly, Apply 1 Application topically 3 (three) times daily as needed (rectum).   magnesium  oxide (MAG-OX) 400 (240 Mg) MG tablet, Take 400 mg by mouth daily.   Multiple Vitamin (MULTIVITAMIN) tablet, Take 1 tablet by mouth daily.     Omega-3 Fatty Acids (FISH OIL CONCENTRATE PO), Take 1 capsule by mouth daily.    potassium chloride (KLOR-CON) 10 MEQ tablet, TAKE 1 TABLET BY MOUTH EVERY DAY  Medical History:  Past Medical History:  Diagnosis Date   Allergy    Cataract    beginnings of    Glaucoma    Heart murmur    noted on PCP exam 11/23/2012; "does not appear urgent"   Hemorrhoids    Hypertension    under control with meds., has been on med. > 20 yr.   Radius fracture 11/22/2012   left, comminuted   Allergies:  Allergies  Allergen Reactions   Amoxicillin Hives and Swelling   Spironolactone Diarrhea   Augmentin [Amoxicillin-Pot Clavulanate] Diarrhea    Tore stomach up   Shrimp [Shellfish Allergy] Hives and Swelling     Surgical History:  She  has a past surgical history that includes Partial hysterectomy; Endometrial ablation; Colonoscopy (03/2006); Colonoscopy with propofol (11/15/2011); Abdominal hysterectomy (06/02/2000); and Open reduction internal fixation (orif) distal radial fracture (Left, 11/29/2012). Family History:  Her family history includes Breast cancer in her maternal aunt; Colon cancer in her maternal uncle, mother, and paternal uncle; Colon polyps in her sister; Coronary artery disease in her father; Dementia in her father and mother; Diabetes in her maternal aunt and mother; Heart attack in her mother; Heart disease in her father and mother; Hypertension in her father and mother. Social History:   reports that she has never smoked. She has never used smokeless tobacco. She reports that she does not drink alcohol and does not use drugs.  REVIEW OF SYSTEMS  : All other systems reviewed and negative except where noted in the History of Present Illness.   PHYSICAL EXAM: BP 138/70   Pulse 66   Ht 5\' 7"  (1.702 m)   Wt 220 lb (99.8 kg)   BMI 34.46  kg/m  General:   Pleasant, well developed female in no acute distress Head:   Normocephalic and atraumatic. Eyes:  sclerae anicteric,conjunctive pink  Heart:   regular rate and rhythm Pulm:  Clear anteriorly; no wheezing Abdomen:   Soft, Obese AB, Active bowel sounds. No tenderness . Without guarding and Without rebound, No organomegaly appreciated. Rectal: no fissure seen, small posterior tender non thrombosed external hemorrhoids, decreased rectal tone, no internal hemorrhoids appreciated, no masses, non tender, soft small volume brown stool Extremities:  Without edema. Msk: Symmetrical without gross deformities. Peripheral pulses intact.  Neurologic:  Alert and  oriented x4;  No focal deficits.  Skin:   Dry and intact without significant lesions or rashes. Psychiatric:  Cooperative. Normal mood and affect.    Doree Albee, PA-C 9:49 AM

## 2022-06-07 NOTE — Telephone Encounter (Signed)
Patient scheduled BP f/u for 06/10/22

## 2022-06-08 ENCOUNTER — Ambulatory Visit (INDEPENDENT_AMBULATORY_CARE_PROVIDER_SITE_OTHER): Payer: Medicare HMO | Admitting: Physician Assistant

## 2022-06-08 ENCOUNTER — Encounter: Payer: Self-pay | Admitting: Physician Assistant

## 2022-06-08 VITALS — BP 138/70 | HR 66 | Ht 67.0 in | Wt 220.0 lb

## 2022-06-08 DIAGNOSIS — K644 Residual hemorrhoidal skin tags: Secondary | ICD-10-CM

## 2022-06-08 DIAGNOSIS — K648 Other hemorrhoids: Secondary | ICD-10-CM

## 2022-06-08 DIAGNOSIS — K6289 Other specified diseases of anus and rectum: Secondary | ICD-10-CM

## 2022-06-08 MED ORDER — LIDOCAINE HCL URETHRAL/MUCOSAL 2 % EX GEL: 1.0000 | Freq: Three times a day (TID) | CUTANEOUS | 1 refills | Status: AC | PRN

## 2022-06-08 MED ORDER — HYDROCORTISONE ACETATE 25 MG RE SUPP
RECTAL | 1 refills | Status: AC
Start: 2022-06-08 — End: ?

## 2022-06-08 NOTE — Patient Instructions (Addendum)
We have sent a referal to general surgery for you, you should hear from them in a few short weeks, if you don't hear from them after 3 weeks give them a call to schedule at [phone]  We have scheduled you for a follow up with Dr Rhea Belton on Tuesday Oct 19, 2022 at 10:10am.  Please do sitz baths- these can be found at the pharmacy. It is a Chief Operating Officer that is put in your toliet.  Please increase fiber or add benefiber, increase water and increase acitivity.  Continue the cream Will send in lidocaine cream to take as needed Get squatty potty or stool to get your knees above your hips during Bm's.   About Hemorrhoids  Hemorrhoids are swollen veins in the lower rectum and anus.  Also called piles, hemorrhoids are a common problem.  Hemorrhoids may be internal (inside the rectum) or external (around the anus).  Internal Hemorrhoids  Internal hemorrhoids are often painless, but they rarely cause bleeding.  The internal veins may stretch and fall down (prolapse) through the anus to the outside of the body.  The veins may then become irritated and painful.  External Hemorrhoids  External hemorrhoids can be easily seen or felt around the anal opening.  They are under the skin around the anus.  When the swollen veins are scratched or broken by straining, rubbing or wiping they sometimes bleed.  How Hemorrhoids Occur  Veins in the rectum and around the anus tend to swell under pressure.  Hemorrhoids can result from increased pressure in the veins of your anus or rectum.  Some sources of pressure are:  Straining to have a bowel movement because of constipation Waiting too long to have a bowel movement Coughing and sneezing often Sitting for extended periods of time, including on the toilet Diarrhea Obesity Trauma or injury to the anus Some liver diseases Stress Family history of hemorrhoids Pregnancy  Pregnant women should try to avoid becoming constipated, because they are more  likely to have hemorrhoids during pregnancy.  In the last trimester of pregnancy, the enlarged uterus may press on blood vessels and causes hemorrhoids.  In addition, the strain of childbirth sometimes causes hemorrhoids after the birth.  Symptoms of Hemorrhoids  Some symptoms of hemorrhoids include: Swelling and/or a tender lump around the anus Itching, mild burning and bleeding around the anus Painful bowel movements with or without constipation Bright red blood covering the stool, on toilet paper or in the toilet bowel.   Symptoms usually go away within a few days.  Always talk to your doctor about any bleeding to make sure it is not from some other causes.  Diagnosing and Treating Hemorrhoids  Diagnosis is made by an examination by your healthcare provider.  Special test can be performed by your doctor.    Most cases of hemorrhoids can be treated with: High-fiber diet: Eat more high-fiber foods, which help prevent constipation.  Ask for more detailed fiber information on types and sources of fiber from your healthcare provider. Fluids: Drink plenty of water.  This helps soften bowel movements so they are easier to pass. Sitz baths and cold packs: Sitting in lukewarm water two or three times a day for 15 minutes cleases the anal area and may relieve discomfort.  If the water is too hot, swelling around the anus will get worse.  Placing a cloth-covered ice pack on the anus for ten minutes four times a day can also help reduce selling.  Gently pushing a prolapsed  hemorrhoid back inside after the bath or ice pack can be helpful. Medications: For mild discomfort, your healthcare provider may suggest over-the-counter pain medication or prescribe a cream or ointment for topical use.  The cream may contain witch hazel, zinc oxide or petroleum jelly.  Medicated suppositories are also a treatment option.  Always consult your doctor before applying medications or creams. Procedures and surgeries: There  are also a number of procedures and surgeries to shrink or remove hemorrhoids in more serious cases.  Talk to your physician about these options.  You can often prevent hemorrhoids or keep them from becoming worse by maintaining a healthy lifestyle.  Eat a fiber-rich diet of fruits, vegetables and whole grains.  Also, drink plenty of water and exercise regularly.     _______________________________________________________  If your blood pressure at your visit was 140/90 or greater, please contact your primary care physician to follow up on this.  _______________________________________________________  If you are age 57 or older, your body mass index should be between 23-30. Your There is no height or weight on file to calculate BMI. If this is out of the aforementioned range listed, please consider follow up with your Primary Care Provider.  If you are age 88 or younger, your body mass index should be between 19-25. Your There is no height or weight on file to calculate BMI. If this is out of the aformentioned range listed, please consider follow up with your Primary Care Provider.   ________________________________________________________  The [place] [facility] providers would like to encourage you to use [facility] to communicate with providers for non-urgent requests or questions.  Due to long hold times on the telephone, sending your provider a message by [facility] may be a faster and more efficient way to get a response.  Please allow 48 business hours for a response.  Please remember that this is for non-urgent requests.  _______________________________________________________ It was a pleasure to see you today!  Thank you for trusting me with your gastrointestinal care!

## 2022-06-08 NOTE — Progress Notes (Signed)
Addendum: Reviewed and agree with assessment and management plan. Follow-up with me if she continues to have bleeding or hemorrhoidal symptoms we can perform anoscopy and if internal hemorrhoids are seen consider banding at that time Temperance Kelemen, Carie Caddy, MD

## 2022-06-10 ENCOUNTER — Encounter: Payer: Self-pay | Admitting: Family Medicine

## 2022-06-10 ENCOUNTER — Ambulatory Visit (INDEPENDENT_AMBULATORY_CARE_PROVIDER_SITE_OTHER): Payer: Medicare HMO | Admitting: Family Medicine

## 2022-06-10 VITALS — BP 138/88 | HR 64 | Temp 97.8°F | Ht 67.0 in | Wt 218.2 lb

## 2022-06-10 DIAGNOSIS — I10 Essential (primary) hypertension: Secondary | ICD-10-CM | POA: Diagnosis not present

## 2022-06-10 DIAGNOSIS — R5382 Chronic fatigue, unspecified: Secondary | ICD-10-CM | POA: Diagnosis not present

## 2022-06-10 NOTE — Progress Notes (Signed)
Subjective:    Patient ID: Rebecca [name], female    DOB: 11-01-1955, 66 y.o.   MRN: [id number]  HPI Pt presents for f/u of HTN   Wt Readings from Last 3 Encounters:  06/10/22 218 lb 4 oz (99 kg)  06/08/22 220 lb (99.8 kg)  06/03/22 216 lb 12.8 oz (98.3 kg)   34.18 kg/m  Vitals:   06/10/22 1027  BP: (!) 146/82  Pulse: 64  Temp: 97.8 F (36.6 C)  SpO2: 95%    Pt has struggled with HTN and tolerance of medications  BP Readings from Last 3 Encounters:  06/10/22 (!) 146/82  06/08/22 138/70  06/03/22 138/78   Benazepril 20 mg daily  Hctz 50 mg daily   Today she cut 1/2 both medicines due to fatigue   Readings have been good at home- pleased  Was 127/79 this am     Too early to notice if hair has changed   Hands feel tight  Sleep less well without amolodipine  Feels more tired     Intolerances Aldactone- diarrhea Metoprolol- fatigue  Amlodipine- hair thinning     Klor con   Last metabolic panel Lab Results  Component Value Date   GLUCOSE 100 (H) 01/05/2022   NA 138 01/05/2022   K 4.1 01/05/2022   CL 99 01/05/2022   CO2 28 01/05/2022   BUN 20 01/05/2022   CREATININE 0.77 01/05/2022   GFRNONAA >60 08/03/2020   CALCIUM 9.7 01/05/2022   PHOS 3.2 02/07/2009   PROT 7.1 01/05/2022   ALBUMIN 4.3 01/05/2022   BILITOT 0.4 01/05/2022   ALKPHOS 51 01/05/2022   AST 16 01/05/2022   ALT 13 01/05/2022   ANIONGAP 9 08/03/2020   Lab Results  Component Value Date   HGBA1C 5.8 (A) 05/06/2022   Lab Results  Component Value Date   TSH 1.28 10/14/2020     Saw cardiology last month :  Plan as follows  In order of problems listed above:   Hypertension, patient is adamant about amlodipine causing hair loss.  Stop Lotrel (benazepril amlodipine) due to concerns for hair loss.  Prescribed benazepril 20mg , continue HCTZ 50 mg daily.  Check BP and keep log.  Monitor symptoms of hair loss, if hair loss persists despite taking off amlodipine, will restart  amlodipine at follow-up visit and stop benazepril since ACE inhibitors have also been attributed to hair loss, less so calcium channel blockers. Possible hair loss from medication side effect.  Management as above.   Follow-up in 4 to 6 weeks   Hyperlipidemia Lab Results  Component Value Date   CHOL 206 (H) 02/24/2021   HDL 61.60 02/24/2021   LDLCALC 127 (H) 02/24/2021   LDLDIRECT 107.3 08/11/2009   TRIG 90.0 02/24/2021   CHOLHDL 3 02/24/2021   She is afraid to start statin due to hair loss   Cardiology f/u is in July   Eating well Getting some exercise- walking   Patient Active Problem List   Diagnosis Date Noted   Class 2 severe obesity due to excess calories with serious comorbidity and body mass index (BMI) of 35.0 to 35.9 in adult [facility]) 05/08/2022   Atypical chest pain 05/06/2022   Hepatic steatosis 01/05/2022   Crepitus of cervical spine 04/30/2021   Unilateral occipital headache 04/30/2021   Medicare annual wellness visit, subsequent 11/24/2020   Low back pain 10/14/2020   Breast pain 07/22/2020   Insomnia 07/23/2019   Heart rate problem 05/29/2019   Estrogen deficiency 11/27/2018  Encounter for pre-employment examination 11/02/2016   Fungal nail infection 04/27/2016   Sinusitis, chronic 03/12/2015   Fatigue 05/24/2014   Hyperlipidemia 10/30/2013   History of radius fracture 11/23/2012   Undiagnosed cardiac murmurs 11/23/2012   Family history of colon cancer requiring screening colonoscopy 09/14/2011   Routine general medical examination at a health care facility 09/05/2011   Mixed incontinence urge and stress 04/14/2011   Hemorrhoids, internal, with bleeding 10/12/2010   External hemorrhoids 11/04/2009   ALLERGIC RHINITIS 08/13/2009   Hypokalemia 04/28/2007   Prediabetes 11/15/2006   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 > 20 yr.   Radius fracture 11/22/2012   left, comminuted   Past Surgical History:  Procedure Laterality Date   ABDOMINAL HYSTERECTOMY  06/02/2000   partial   COLONOSCOPY  03/2006   COLONOSCOPY WITH PROPOFOL  11/15/2011   ENDOMETRIAL ABLATION     OPEN REDUCTION INTERNAL FIXATION (ORIF) DISTAL RADIAL FRACTURE Left 11/29/2012   Procedure: OPEN REDUCTION INTERNAL FIXATION (ORIF) LEFT  DISTAL RADIAL FRACTURE;  Surgeon: Nicki Reaper, MD;  Location: [facility];  Service: Orthopedics;  Laterality: Left;   PARTIAL HYSTERECTOMY     Social History   Tobacco Use   Smoking status: Never   Smokeless tobacco: Never  Vaping Use   Vaping Use: Never used  Substance Use Topics   Alcohol use: No   Drug use: No   Family History  Problem Relation Age of Onset   Heart attack Mother    Heart disease Mother    Hypertension Mother    Colon cancer Mother    Diabetes Mother    Dementia Mother    Heart disease Father    Hypertension Father    Coronary artery disease Father        with CABG   Dementia Father    Colon polyps Sister    Breast cancer Maternal Aunt    Diabetes Maternal Aunt    Colon cancer Maternal Uncle    Colon cancer Paternal Uncle    Esophageal cancer Neg Hx    Rectal cancer Neg Hx    Stomach cancer Neg Hx    Allergies  Allergen Reactions   Amoxicillin Hives and Swelling   Spironolactone Diarrhea   Augmentin [Amoxicillin-Pot Clavulanate] Diarrhea    Tore stomach up   Shrimp [Shellfish Allergy] Hives and Swelling   Current Outpatient Medications on File Prior to Visit  Medication Sig Dispense Refill   benazepril (LOTENSIN) 20 MG tablet Take 1 tablet (20 mg total) by mouth daily. 90 tablet 0   cholecalciferol (VITAMIN  D3) 25 MCG (1000 UNIT) tablet Take 1,000 Units by mouth daily.     Cyanocobalamin (B-12) 5000 MCG CAPS Take 1 capsule by mouth every other day.     diclofenac (VOLTAREN) 75 MG EC tablet Take 1 tablet (75 mg total) by mouth 2 (two) times daily. Bid prn pain 60 tablet 2   fluticasone (FLONASE) 50 MCG/ACT nasal spray Place 2 sprays into both nostrils daily as needed for allergies or rhinitis. 48  g 3   hydrochlorothiazide (HYDRODIURIL) 50 MG tablet Take 1 tablet (50 mg total) by mouth daily. 90 tablet 3   hydrocortisone (ANUCORT-HC) 25 MG suppository UNWRAP AND INSERT ONE SUPPOSITORY RECTALLY EVERY NIGHT AT BEDTIME AS NEEDED FOR HEMORRHOIDS 12 suppository 1   hydrocortisone (ANUSOL-HC) 2.5 % rectal cream Apply to affected area on hemorrhoids once daily as needed 30 g 0   latanoprost (XALATAN) 0.005 % ophthalmic solution Place 1 drop into both eyes.      lidocaine (XYLOCAINE) 2 % jelly Apply 1 Application topically 3 (three) times daily as needed (rectum). 30 mL 1   magnesium oxide (MAG-OX) 400 (240 Mg) MG tablet Take 400 mg by mouth daily.     Multiple Vitamin (MULTIVITAMIN) tablet Take 1 tablet by mouth daily.       Omega-3 Fatty Acids (FISH OIL CONCENTRATE PO) Take 1 capsule by mouth daily.      potassium chloride (KLOR-CON) 10 MEQ tablet TAKE 1 TABLET BY MOUTH EVERY DAY 90 tablet 2   No current facility-administered medications on file prior to visit.    Review of Systems  Constitutional:  Positive for fatigue. Negative for activity change, appetite change, fever and unexpected weight change.  HENT:  Negative for congestion, ear pain, rhinorrhea, sinus pressure and sore throat.   Eyes:  Negative for pain, redness and visual disturbance.  Respiratory:  Negative for cough, shortness of breath and wheezing.   Cardiovascular:  Negative for chest pain and palpitations.  Gastrointestinal:  Negative for abdominal pain, blood in stool, constipation and diarrhea.  Endocrine: Negative for polydipsia and  polyuria.  Genitourinary:  Negative for dysuria, frequency and urgency.  Musculoskeletal:  Negative for arthralgias, back pain and myalgias.  Skin:  Negative for pallor and rash.       Hair loss  Allergic/Immunologic: Negative for environmental allergies.  Neurological:  Negative for dizziness, syncope and headaches.  Hematological:  Negative for adenopathy. Does not bruise/bleed easily.  Psychiatric/Behavioral:  Negative for decreased concentration and dysphoric mood. The patient is not nervous/anxious.        Objective:   Physical Exam Constitutional:      General: She is not in acute distress.    Appearance: Normal appearance. She is well-developed. She is obese. She is not ill-appearing or diaphoretic.  HENT:     Head: Normocephalic and atraumatic.  Eyes:     Conjunctiva/sclera: Conjunctivae normal.     Pupils: Pupils are equal, round, and reactive to light.  Neck:     Thyroid: No thyromegaly.     Vascular: No carotid bruit or JVD.  Cardiovascular:     Rate and Rhythm: Normal rate and regular rhythm.     Heart sounds: Normal heart sounds.     No gallop.  Pulmonary:     Effort: Pulmonary effort is normal. No respiratory distress.     Breath sounds: Normal breath sounds. No wheezing or rales.  Abdominal:     General: There is no distension or abdominal bruit.     Palpations: Abdomen is soft.  Musculoskeletal:     Cervical back: Normal range of motion and neck supple.     Right lower leg: No edema.     Left lower leg: No edema.  Lymphadenopathy:     Cervical: No cervical adenopathy.  Skin:    General: Skin is warm and dry.     Coloration: Skin is not pale.     Findings: No rash.  Neurological:     Mental Status:  She is alert.     Coordination: Coordination normal.     Deep Tendon Reflexes: Reflexes are normal and symmetric. Reflexes normal.  Psychiatric:        Mood and Affect: Mood normal.           Assessment & Plan:   Problem List Items Addressed This  Visit       Cardiovascular and Mediastinum   Essential hypertension - Primary    BP: 138/88  Running well at home She did stop amlodipine due to ? Cause of hair loss Reviewed note from pharmacist from 5/13 Reviewed note from cardiology from 5/30 Now taking Benazepril 20 mg daily  Hctz 50 mg daily  ? If causing fatigue( all meds) she cut in 1/2 today and feels the same)  Will go back to both meds at original doses  F/u with cardiology in July  Encouraged to keep working on diet and exercise         Other   Fatigue    May be multi factorial Reviewed last labs Also labs from her brief other pcp   She will return if this continues Stressed importance of self care and exercise

## 2022-06-10 NOTE — Assessment & Plan Note (Addendum)
BP: 138/88  Running well at home She did stop amlodipine due to ? Cause of hair loss Reviewed note from pharmacist from 5/13 Reviewed note from cardiology from 5/30 Now taking Benazepril 20 mg daily  Hctz 50 mg daily  ? If causing fatigue( all meds) she cut in 1/2 today and feels the same)  Will go back to both meds at original doses  F/u with cardiology in July  Encouraged to keep working on diet and exercise

## 2022-06-10 NOTE — Assessment & Plan Note (Signed)
May be multi factorial Reviewed last labs Also labs from her brief other pcp   She will return if this continues Stressed importance of self care and exercise

## 2022-06-10 NOTE — Patient Instructions (Addendum)
Add some strength training to your routine, this is important for bone and brain health and can reduce your risk of falls and help your body use insulin properly and regulate weight  Light weights, exercise bands , and internet videos are a good way to start  Yoga (chair or regular), machines , floor exercises or a gym with machines are also good options   Try to get most of your carbohydrates from produce (with the exception of white potatoes)  Eat less bread/pasta/rice/snack foods/cereals/sweets and other items from the middle of the grocery store (processed carbs)  Keep taking good care of yourself   Continue current medicines

## 2022-06-15 ENCOUNTER — Encounter: Payer: Self-pay | Admitting: Pharmacist

## 2022-06-21 ENCOUNTER — Other Ambulatory Visit: Payer: BC Managed Care – PPO | Admitting: Pharmacist

## 2022-06-23 ENCOUNTER — Encounter: Payer: Self-pay | Admitting: Internal Medicine

## 2022-07-12 ENCOUNTER — Other Ambulatory Visit: Payer: BC Managed Care – PPO | Admitting: Pharmacist

## 2022-07-19 ENCOUNTER — Encounter: Payer: Self-pay | Admitting: Cardiology

## 2022-07-19 ENCOUNTER — Ambulatory Visit: Payer: Medicare HMO | Attending: Cardiology | Admitting: Cardiology

## 2022-07-19 VITALS — BP 128/70 | HR 65 | Resp 98 | Ht 67.0 in | Wt 222.8 lb

## 2022-07-19 DIAGNOSIS — I10 Essential (primary) hypertension: Secondary | ICD-10-CM | POA: Diagnosis not present

## 2022-07-19 DIAGNOSIS — G4719 Other hypersomnia: Secondary | ICD-10-CM | POA: Diagnosis not present

## 2022-07-19 DIAGNOSIS — F5104 Psychophysiologic insomnia: Secondary | ICD-10-CM | POA: Diagnosis not present

## 2022-07-19 NOTE — Patient Instructions (Signed)
Medication Instructions:   Your physician recommends that you continue on your current medications as directed. Please refer to the Current Medication list given to you today.  *If you need a refill on your cardiac medications before your next appointment, please call your pharmacy*   Lab Work:  None Ordered  If you have labs (blood work) drawn today and your tests are completely normal, you will receive your results only by: MyChart Message (if you have MyChart) OR A paper copy in the mail If you have any lab test that is abnormal or we need to change your treatment, we will call you to review the results.   Testing/Procedures:  None Ordered   Follow-Up: At [facility], you and your health needs are our priority.  As part of our continuing mission to provide you with exceptional heart care, we have created designated Provider Care Teams.  These Care Teams include your primary Cardiologist (physician) and Advanced Practice Providers (APPs -  Physician Assistants and Nurse Practitioners) who all work together to provide you with the care you need, when you need it.  We recommend signing up for the patient portal called "MyChart".  Sign up information is provided on this After Visit Summary.  MyChart is used to connect with patients for Virtual Visits (Telemedicine).  Patients are able to view lab/test results, encounter notes, upcoming appointments, etc.  Non-urgent messages can be sent to your provider as well.   To learn more about what you can do with MyChart, go to [email or web address].    Your next appointment:   4 month(s)  Provider:   You may see Debbe Odea, MD or one of the following Advanced Practice Providers on your designated Care Team:   Nicolasa Ducking, NP Eula Listen, PA-C Cadence Fransico Michael, PA-C Charlsie Quest, NP

## 2022-07-19 NOTE — Progress Notes (Signed)
Cardiology Office Note:    Date:  07/19/2022   ID:  Rebecca [name], DOB [birth year], MRN [id number]  PCP:  Rebecca Pimple, MD   [facility] Providers Cardiologist:  Rebecca Odea, MD     Referring MD: Rebecca Pimple, MD   Chief Complaint  Patient presents with   Follow-up    Patient denies new or acute cardiac problems/concerns today.  Tolerating Benazepril well.   Rebecca [name] is a 67 y.o. female who is being seen today for the evaluation of hypertension at the request of Tower, Audrie Gallus, MD.   History of Present Illness:    Rebecca [name] is a 67 y.o. female with a hx of hypertension who presents for follow-up.  Previously seen for elevated BP and medication management.  States Lotrel/combination of amlodipine and benazepril was causing hair loss.  Was adamant amlodipine is the cause.  Would like to come off amlodipine.  Amlodipine was stopped, benazepril 20 mg only and HCTZ was continued.  Blood pressures at home range in the 120s to 130s systolic.  Feels well, no concerns at this time.  States it's too early to tell if current medication has any effect on hair loss.  Past Medical History:  Diagnosis Date   Allergy    Cataract    beginnings of    Glaucoma    Heart murmur    noted on PCP exam 11/23/2012; "does not appear urgent"   Hemorrhoids    Hypertension    under control with meds., has been on med. > 20 yr.   Radius fracture 11/22/2012   left, comminuted    Past Surgical History:  Procedure Laterality Date   ABDOMINAL HYSTERECTOMY  06/02/2000   partial   COLONOSCOPY  03/2006   COLONOSCOPY WITH PROPOFOL  11/15/2011   ENDOMETRIAL ABLATION     OPEN REDUCTION INTERNAL FIXATION (ORIF) DISTAL RADIAL FRACTURE Left 11/29/2012   Procedure: OPEN REDUCTION INTERNAL FIXATION (ORIF) LEFT  DISTAL RADIAL FRACTURE;  Surgeon: Nicki Reaper, MD;  Location: [facility];  Service: Orthopedics;  Laterality: Left;   PARTIAL HYSTERECTOMY      Current  Medications: Current Meds  Medication Sig   benazepril (LOTENSIN) 20 MG tablet Take 1 tablet (20 mg total) by mouth daily.   cholecalciferol (VITAMIN D3) 25 MCG (1000 UNIT) tablet Take 1,000 Units by mouth daily.   Cyanocobalamin (B-12) 5000 MCG CAPS Take 1 capsule by mouth every other day.   diclofenac (VOLTAREN) 75 MG EC tablet Take 1 tablet (75 mg total) by mouth 2 (two) times daily. Bid prn pain   fluticasone (FLONASE) 50 MCG/ACT nasal spray Place 2 sprays into both nostrils daily as needed for allergies or rhinitis.   hydrochlorothiazide (HYDRODIURIL) 50 MG tablet Take 1 tablet (50 mg total) by mouth daily.   hydrocortisone (ANUCORT-HC) 25 MG suppository UNWRAP AND INSERT ONE SUPPOSITORY RECTALLY EVERY NIGHT AT BEDTIME AS NEEDED FOR HEMORRHOIDS   hydrocortisone (ANUSOL-HC) 2.5 % rectal cream Apply to affected area on hemorrhoids once daily as needed   latanoprost (XALATAN) 0.005 % ophthalmic solution Place 1 drop into both eyes.    lidocaine (XYLOCAINE) 2 % jelly Apply 1 Application topically 3 (three) times daily as needed (rectum).   magnesium oxide (MAG-OX) 400 (240 Mg) MG tablet Take 400 mg by mouth daily.   Multiple Vitamin (MULTIVITAMIN) tablet Take 1 tablet by mouth daily.     Omega-3 Fatty Acids (FISH OIL CONCENTRATE PO) Take 1 capsule by mouth daily.  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   Occupation: retired  Tobacco Use   Smoking status: Never   Smokeless tobacco: Never   Vaping Use   Vaping status: Never Used  Substance and Sexual Activity   Alcohol use: No   Drug use: No   Sexual activity: Not on file  Other Topics Concern   Not on file  Social History Narrative   Regular exercise   Social Determinants of Health   Financial Resource Strain: Low Risk  (06/09/2022)   Overall Financial Resource Strain (CARDIA)    Difficulty of Paying Living Expenses: Not hard at all  Food Insecurity: No Food Insecurity (06/09/2022)   Hunger Vital Sign    Worried About Running Out of Food in the Last Year: Never true    Ran Out of Food in the Last Year: Never true  Transportation Needs: No Transportation Needs (06/09/2022)   PRAPARE - Administrator, Civil Service (Medical): No    Lack of Transportation (Non-Medical): No  Physical Activity: Insufficiently Active (06/09/2022)   Exercise Vital Sign    Days of Exercise per Week: 2 days    Minutes of Exercise per Session: 20 min  Stress: No Stress Concern Present (06/09/2022)   [employer] of Occupational Health - Occupational Stress Questionnaire    Feeling of Stress : Not at all  Social Connections: Moderately Integrated (06/09/2022)   Social Connection and Isolation Panel [NHANES]    Frequency of Communication with Friends and Family: More than three times a week    Frequency of Social Gatherings with Friends and Family: More than three times a week    Attends Religious Services: More than 4 times per year    Active Member of [employer] or Organizations: Yes    Attends Engineer, structural: More than 4 times per year    Marital Status: Divorced     Family History: The patient's family history includes Breast cancer in her maternal aunt; Colon cancer in her maternal uncle, mother, and paternal uncle; Colon polyps in her sister; Coronary artery disease in her father; Dementia in her father and mother; Diabetes in her maternal aunt and mother; Heart attack in her mother; Heart disease in her father and  mother; Hypertension in her father and mother. There is no history of Esophageal cancer, Rectal cancer, or Stomach cancer.  ROS:   Please see the history of present illness.     All other systems reviewed and are negative.  EKGs/Labs/Other Studies Reviewed:    The following studies were reviewed today:   EKG:  EKG not ordered today.    Recent Labs: 01/05/2022: ALT 13; BUN 20; Creatinine, Ser 0.77; Potassium 4.1; Sodium 138  Recent Lipid Panel    Component Value Date/Time   CHOL 206 (H) 02/24/2021 0924   TRIG 90.0 02/24/2021 0924   HDL 61.60 02/24/2021 0924   CHOLHDL 3 02/24/2021 0924   VLDL 18.0 02/24/2021 0924   LDLCALC 127 (H)  02/24/2021 0924   LDLDIRECT 107.3 08/11/2009 0817     Risk Assessment/Calculations:     Physical Exam:    VS:  BP 128/70 (BP Location: Right Arm, Patient Position: Sitting, Cuff Size: Normal)   Pulse 65   Resp (!) 98   Ht 5\' 7"  (1.702 m)   Wt 222 lb 12.8 oz (101.1 kg)   BMI 34.90 kg/m     Wt Readings from Last 3 Encounters:  07/19/22 222 lb 12.8 oz (101.1 kg)  06/10/22 218 lb 4 oz (99 kg)  06/08/22 220 lb (99.8 kg)     GEN:  Well nourished, well developed in no acute distress HEENT: Normal NECK: No JVD; No carotid bruits CARDIAC: RRR, no murmurs, rubs, gallops RESPIRATORY:  Clear to auscultation without rales, wheezing or rhonchi  ABDOMEN: Soft, non-tender, non-distended MUSCULOSKELETAL:  No edema; No deformity  SKIN: Warm and dry NEUROLOGIC:  Alert and oriented x 3 PSYCHIATRIC:  Normal affect   ASSESSMENT:    1. Primary hypertension    PLAN:    In order of problems listed above:  Hypertension, BP elevated today, controlled at home.  Continue HCTZ 50, benazepril 20 mg daily.  Amlodipine previously stopped due to patient concerns regarding hair loss.  If BP becomes elevated at home, consider increasing benazepril.  ACE inhibitor has also been described to cause some hair loss.  Follow-up in 4 months.     Medication  Adjustments/Labs and Tests Ordered: Current medicines are reviewed at length with the patient today.  Concerns regarding medicines are outlined above.  No orders of the defined types were placed in this encounter.  No orders of the defined types were placed in this encounter.   Patient Instructions  Medication Instructions:   Your physician recommends that you continue on your current medications as directed. Please refer to the Current Medication list given to you today.  *If you need a refill on your cardiac medications before your next appointment, please call your pharmacy*   Lab Work:  None Ordered  If you have labs (blood work) drawn today and your tests are completely normal, you will receive your results only by: MyChart Message (if you have MyChart) OR A paper copy in the mail If you have any lab test that is abnormal or we need to change your treatment, we will call you to review the results.   Testing/Procedures:  None Ordered   Follow-Up: At [facility], you and your health needs are our priority.  As part of our continuing mission to provide you with exceptional heart care, we have created designated Provider Care Teams.  These Care Teams include your primary Cardiologist (physician) and Advanced Practice Providers (APPs -  Physician Assistants and Nurse Practitioners) who all work together to provide you with the care you need, when you need it.  We recommend signing up for the patient portal called "MyChart".  Sign up information is provided on this After Visit Summary.  MyChart is used to connect with patients for Virtual Visits (Telemedicine).  Patients are able to view lab/test results, encounter notes, upcoming appointments, etc.  Non-urgent messages can be sent to your provider as well.   To learn more about what you can do with MyChart, go to [email or web address].    Your next appointment:   4 month(s)  Provider:   You may see Rebecca Odea, MD or one of the following Advanced Practice Providers on your designated Care Team:   Nicolasa Ducking, NP Eula Listen,  PA-C Cadence Fransico Michael, PA-C Charlsie Quest, NP   Signed, Rebecca Odea, MD  07/19/2022 9:24 AM    [facility]

## 2022-07-21 DIAGNOSIS — K602 Anal fissure, unspecified: Secondary | ICD-10-CM | POA: Diagnosis not present

## 2022-07-23 ENCOUNTER — Other Ambulatory Visit: Payer: Self-pay | Admitting: Family Medicine

## 2022-07-23 DIAGNOSIS — Z1231 Encounter for screening mammogram for malignant neoplasm of breast: Secondary | ICD-10-CM

## 2022-08-04 ENCOUNTER — Ambulatory Visit (INDEPENDENT_AMBULATORY_CARE_PROVIDER_SITE_OTHER): Payer: Medicare HMO

## 2022-08-04 VITALS — BP 111/71 | Ht 67.0 in | Wt 217.0 lb

## 2022-08-04 DIAGNOSIS — Z0000 Encounter for general adult medical examination without abnormal findings: Secondary | ICD-10-CM

## 2022-08-04 NOTE — Patient Instructions (Signed)
Rebecca [name] , Thank you for taking time to come for your Medicare Wellness Visit. I appreciate your ongoing commitment to your health goals. Please review the following plan we discussed and let me know if I can assist you in the future.   Referrals/Orders/Follow-Ups/Clinician Recommendations: Aim for 30 minutes of exercise or brisk walking, 6-8 glasses of water, and 5 servings of fruits and vegetables each day.   This is a list of the screening recommended for you and due dates:  Health Maintenance  Topic Date Due   Hepatitis C Screening  Never done   Medicare Annual Wellness Visit  11/24/2021   COVID-19 Vaccine (7 - 2023-24 season) 05/14/2022   Flu Shot  08/05/2022   DTaP/Tdap/Td vaccine (3 - Td or Tdap) 11/21/2022   Mammogram  08/28/2023   Colon Cancer Screening  07/14/2026   Pneumonia Vaccine  Completed   DEXA scan (bone density measurement)  Completed   Zoster (Shingles) Vaccine  Completed   HPV Vaccine  Aged Out    Advanced directives: (Copy Requested) Please bring a copy of your health care power of attorney and living will to the office to be added to your chart at your convenience.  Next Medicare Annual Wellness Visit scheduled for next year: Yes  Preventive Care 102 Years and Older, Female Preventive care refers to lifestyle choices and visits with your health care provider that can promote health and wellness. What does preventive care include? A yearly physical exam. This is also called an annual well check. Dental exams once or twice a year. Routine eye exams. Ask your health care provider how often you should have your eyes checked. Personal lifestyle choices, including: Daily care of your teeth and gums. Regular physical activity. Eating a healthy diet. Avoiding tobacco and drug use. Limiting alcohol use. Practicing safe sex. Taking low-dose aspirin every day. Taking vitamin and mineral supplements as recommended by your health care provider. What happens during  an annual well check? The services and screenings done by your health care provider during your annual well check will depend on your age, overall health, lifestyle risk factors, and family history of disease. Counseling  Your health care provider may ask you questions about your: Alcohol use. Tobacco use. Drug use. Emotional well-being. Home and relationship well-being. Sexual activity. Eating habits. History of falls. Memory and ability to understand (cognition). Work and work Astronomer. Reproductive health. Screening  You may have the following tests or measurements: Height, weight, and BMI. Blood pressure. Lipid and cholesterol levels. These may be checked every 5 years, or more frequently if you are over 49 years old. Skin check. Lung cancer screening. You may have this screening every year starting at age 43 if you have a 30-pack-year history of smoking and currently smoke or have quit within the past 15 years. Fecal occult blood test (FOBT) of the stool. You may have this test every year starting at age 68. Flexible sigmoidoscopy or colonoscopy. You may have a sigmoidoscopy every 5 years or a colonoscopy every 10 years starting at age 20. Hepatitis C blood test. Hepatitis B blood test. Sexually transmitted disease (STD) testing. Diabetes screening. This is done by checking your blood sugar (glucose) after you have not eaten for a while (fasting). You may have this done every 1-3 years. Bone density scan. This is done to screen for osteoporosis. You may have this done starting at age 57. Mammogram. This may be done every 1-2 years. Talk to your health care provider about how  often you should have regular mammograms. Talk with your health care provider about your test results, treatment options, and if necessary, the need for more tests. Vaccines  Your health care provider may recommend certain vaccines, such as: Influenza vaccine. This is recommended every year. Tetanus,  diphtheria, and acellular pertussis (Tdap, Td) vaccine. You may need a Td booster every 10 years. Zoster vaccine. You may need this after age 51. Pneumococcal 13-valent conjugate (PCV13) vaccine. One dose is recommended after age 70. Pneumococcal polysaccharide (PPSV23) vaccine. One dose is recommended after age 70. Talk to your health care provider about which screenings and vaccines you need and how often you need them. This information is not intended to replace advice given to you by your health care provider. Make sure you discuss any questions you have with your health care provider. Document Released: 01/17/2015 Document Revised: 09/10/2015 Document Reviewed: 10/22/2014 Elsevier Interactive Patient Education  2017 [employer].  Fall Prevention in the Home Falls can cause injuries. They can happen to people of all ages. There are many things you can do to make your home safe and to help prevent falls. What can I do on the outside of my home? Regularly fix the edges of walkways and driveways and fix any cracks. Remove anything that might make you trip as you walk through a door, such as a raised step or threshold. Trim any bushes or trees on the path to your home. Use bright outdoor lighting. Clear any walking paths of anything that might make someone trip, such as rocks or tools. Regularly check to see if handrails are loose or broken. Make sure that both sides of any steps have handrails. Any raised decks and porches should have guardrails on the edges. Have any leaves, snow, or ice cleared regularly. Use sand or salt on walking paths during winter. Clean up any spills in your garage right away. This includes oil or grease spills. What can I do in the bathroom? Use night lights. Install grab bars by the toilet and in the tub and shower. Do not use towel bars as grab bars. Use non-skid mats or decals in the tub or shower. If you need to sit down in the shower, use a plastic,  non-slip stool. Keep the floor dry. Clean up any water that spills on the floor as soon as it happens. Remove soap buildup in the tub or shower regularly. Attach bath mats securely with double-sided non-slip rug tape. Do not have throw rugs and other things on the floor that can make you trip. What can I do in the bedroom? Use night lights. Make sure that you have a light by your bed that is easy to reach. Do not use any sheets or blankets that are too big for your bed. They should not hang down onto the floor. Have a firm chair that has side arms. You can use this for support while you get dressed. Do not have throw rugs and other things on the floor that can make you trip. What can I do in the kitchen? Clean up any spills right away. Avoid walking on wet floors. Keep items that you use a lot in easy-to-reach places. If you need to reach something above you, use a strong step stool that has a grab bar. Keep electrical cords out of the way. Do not use floor polish or wax that makes floors slippery. If you must use wax, use non-skid floor wax. Do not have throw rugs and other things  on the floor that can make you trip. What can I do with my stairs? Do not leave any items on the stairs. Make sure that there are handrails on both sides of the stairs and use them. Fix handrails that are broken or loose. Make sure that handrails are as long as the stairways. Check any carpeting to make sure that it is firmly attached to the stairs. Fix any carpet that is loose or worn. Avoid having throw rugs at the top or bottom of the stairs. If you do have throw rugs, attach them to the floor with carpet tape. Make sure that you have a light switch at the top of the stairs and the bottom of the stairs. If you do not have them, ask someone to add them for you. What else can I do to help prevent falls? Wear shoes that: Do not have high heels. Have rubber bottoms. Are comfortable and fit you well. Are closed  at the toe. Do not wear sandals. If you use a stepladder: Make sure that it is fully opened. Do not climb a closed stepladder. Make sure that both sides of the stepladder are locked into place. Ask someone to hold it for you, if possible. Clearly mark and make sure that you can see: Any grab bars or handrails. First and last steps. Where the edge of each step is. Use tools that help you move around (mobility aids) if they are needed. These include: Canes. Walkers. Scooters. Crutches. Turn on the lights when you go into a dark area. Replace any light bulbs as soon as they burn out. Set up your furniture so you have a clear path. Avoid moving your furniture around. If any of your floors are uneven, fix them. If there are any pets around you, be aware of where they are. Review your medicines with your doctor. Some medicines can make you feel dizzy. This can increase your chance of falling. Ask your doctor what other things that you can do to help prevent falls. This information is not intended to replace advice given to you by your health care provider. Make sure you discuss any questions you have with your health care provider. Document Released: 10/17/2008 Document Revised: 05/29/2015 Document Reviewed: 01/25/2014 Elsevier Interactive Patient Education  2017 [employer].

## 2022-08-04 NOTE — Progress Notes (Signed)
Subjective:   Rebecca [name] is a 67 y.o. female who presents for Medicare Annual (Subsequent) preventive examination.  Visit Complete: Virtual  I connected with  [name] on 08/04/22 by a audio enabled telemedicine application and verified that I am speaking with the correct person using two identifiers.  Patient Location: Home  Provider Location: Home Office  I discussed the limitations of evaluation and management by telemedicine. The patient expressed understanding and agreed to proceed.  Patient Medicare AWV questionnaire was completed by the patient on 07/28/22; I have confirmed that all information answered by patient is correct and no changes since this date.  Vital Signs: Unable to obtain new vitals due to this being a telehealth visit.   Review of Systems      Cardiac Risk Factors include: advanced age (>37men, >62 women);hypertension;dyslipidemia;obesity (BMI >30kg/m2)     Objective:    Today's Vitals   08/04/22 0911  Weight: 217 lb (98.4 kg)  Height: 5\' 7"  (1.702 m)   Body mass index is 33.99 kg/m.     08/04/2022    9:19 AM 09/21/2021    3:44 PM 08/03/2020   12:21 AM 11/23/2016   10:06 AM 11/29/2012    9:09 AM 11/24/2012    2:56 PM  Advanced Directives  Does Patient Have a Medical Advance Directive? Yes No No No  Patient does not have advance directive;Patient would not like information  Type of Public librarian Power of [place];Living will       Copy of Healthcare Power of Attorney in Chart? No - copy requested       Would patient like information on creating a medical advance directive?  Yes (MAU/Ambulatory/Procedural Areas - Information given) Yes (ED - Information included in AVS)     Pre-existing out of facility DNR order (yellow form or pink MOST form)     No     Current Medications (verified) Outpatient Encounter Medications as of 08/04/2022  Medication Sig   benazepril (LOTENSIN) 20 MG tablet Take 1 tablet (20 mg total) by mouth  daily.   cholecalciferol (VITAMIN D3) 25 MCG (1000 UNIT) tablet Take 1,000 Units by mouth daily.   Cyanocobalamin (B-12) 5000 MCG CAPS Take 1 capsule by mouth every other day.   diclofenac (VOLTAREN) 75 MG EC tablet Take 1 tablet (75 mg total) by mouth 2 (two) times daily. Bid prn pain   fluticasone (FLONASE) 50 MCG/ACT nasal spray Place 2 sprays into both nostrils daily as needed for allergies or rhinitis.   hydrochlorothiazide (HYDRODIURIL) 50 MG tablet Take 1 tablet (50 mg total) by mouth daily.   hydrocortisone (ANUCORT-HC) 25 MG suppository UNWRAP AND INSERT ONE SUPPOSITORY RECTALLY EVERY NIGHT AT BEDTIME AS NEEDED FOR HEMORRHOIDS   hydrocortisone (ANUSOL-HC) 2.5 % rectal cream Apply to affected area on hemorrhoids once daily as needed   latanoprost (XALATAN) 0.005 % ophthalmic solution Place 1 drop into both eyes.    lidocaine (XYLOCAINE) 2 % jelly Apply 1 Application topically 3 (three) times daily as needed (rectum).   magnesium oxide (MAG-OX) 400 (240 Mg) MG tablet Take 400 mg by mouth daily.   Multiple Vitamin (MULTIVITAMIN) tablet Take 1 tablet by mouth daily.     Omega-3 Fatty Acids (FISH OIL CONCENTRATE PO) Take 1 capsule by mouth daily.    potassium chloride (KLOR-CON) 10 MEQ tablet TAKE 1 TABLET BY MOUTH EVERY DAY   No facility-administered encounter medications on file as of 08/04/2022.    Allergies (verified) Amoxicillin, Spironolactone, Augmentin [amoxicillin-pot clavulanate], and  Shrimp [shellfish allergy]   History: Past Medical History:  Diagnosis Date   Allergy    Cataract    beginnings of    Glaucoma    Heart murmur    noted on PCP exam 11/23/2012; "does not appear urgent"   Hemorrhoids    Hypertension    under control with meds., has been on med. > 20 yr.   Radius fracture 11/22/2012   left, comminuted   Past Surgical History:  Procedure Laterality Date   ABDOMINAL HYSTERECTOMY  06/02/2000   partial   COLONOSCOPY  03/2006   COLONOSCOPY WITH PROPOFOL   11/15/2011   ENDOMETRIAL ABLATION     OPEN REDUCTION INTERNAL FIXATION (ORIF) DISTAL RADIAL FRACTURE Left 11/29/2012   Procedure: OPEN REDUCTION INTERNAL FIXATION (ORIF) LEFT  DISTAL RADIAL FRACTURE;  Surgeon: Nicki Reaper, MD;  Location: [facility];  Service: Orthopedics;  Laterality: Left;   PARTIAL HYSTERECTOMY     Family History  Problem Relation Age of Onset   Heart attack Mother    Heart disease Mother    Hypertension Mother    Colon cancer Mother    Diabetes Mother    Dementia Mother    Heart disease Father    Hypertension Father    Coronary artery disease Father        with CABG   Dementia Father    Colon polyps Sister    Breast cancer Maternal Aunt    Diabetes Maternal Aunt    Colon cancer Maternal Uncle    Colon cancer Paternal Uncle    Esophageal cancer Neg Hx    Rectal cancer Neg Hx    Stomach cancer Neg Hx    Social History   Socioeconomic History   Marital status: Divorced    Spouse name: Not on file   Number of children: 2   Years of education: Not on file   Highest education level: Master's degree (e.g., MA, MS, MEng, MEd, MSW, MBA)  Occupational History   Occupation: Engineer, site, also going to school for teaching degree, 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   Occupation: retired  Tobacco Use   Smoking status: Never   Smokeless tobacco: Never  Vaping Use   Vaping status: Never Used  Substance and Sexual Activity   Alcohol use: No   Drug use: No   Sexual activity: Not on file  Other Topics Concern   Not on file  Social History Narrative   Regular exercise   Social Determinants of Health   Financial Resource Strain: Low Risk  (08/04/2022)   Overall Financial Resource Strain (CARDIA)    Difficulty of Paying Living Expenses: Not hard at all  Food Insecurity: No Food Insecurity (08/04/2022)   Hunger Vital Sign    Worried About Running Out of Food in the Last Year: Never true    Ran Out of Food in the Last Year: Never true  Transportation Needs: No  Transportation Needs (08/04/2022)   PRAPARE - Administrator, Civil Service (Medical): No    Lack of Transportation (Non-Medical): No  Physical Activity: Sufficiently Active (08/04/2022)   Exercise Vital Sign    Days of Exercise per Week: 3 days    Minutes of Exercise per Session: 90 min  Recent Concern: Physical Activity - Insufficiently Active (06/09/2022)   Exercise Vital Sign    Days of Exercise per Week: 2 days    Minutes of Exercise per Session: 20 min  Stress: No Stress Concern Present (08/04/2022)   [employer]  of Occupational Health - Occupational Stress Questionnaire    Feeling of Stress : Not at all  Social Connections: Moderately Isolated (08/04/2022)   Social Connection and Isolation Panel [NHANES]    Frequency of Communication with Friends and Family: More than three times a week    Frequency of Social Gatherings with Friends and Family: More than three times a week    Attends Religious Services: More than 4 times per year    Active Member of [employer] or Organizations: No    Attends Engineer, structural: Never    Marital Status: Divorced    Tobacco Counseling Counseling given: Not Answered   Clinical Intake:  Pre-visit preparation completed: Yes  Pain : No/denies pain     BMI - recorded: 33.99 Nutritional Status: BMI > 30  Obese Nutritional Risks: None Diabetes: No  How often do you need to have someone help you when you read instructions, pamphlets, or other written materials from your doctor or pharmacy?: 1 - Never  Interpreter Needed?: No  Information entered by :: C.Jencarlos Nicolson LPN   Activities of Daily Living    07/28/2022    1:07 PM  In your present state of health, do you have any difficulty performing the following activities:  Hearing? 0  Vision? 0  Difficulty concentrating or making decisions? 0  Walking or climbing stairs? 0  Dressing or bathing? 0  Doing errands, shopping? 0  Preparing Food and eating ? N  Using the  Toilet? N  In the past six months, have you accidently leaked urine? N  Do you have problems with loss of bowel control? N  Managing your Medications? N  Managing your Finances? N  Housekeeping or managing your Housekeeping? N    Patient Care Team: Tower, Audrie Gallus, MD as PCP - General (Family Medicine) Debbe Odea, MD as PCP - Cardiology (Cardiology)  Indicate any recent Medical Services you may have received from other than [facility] providers in the past year (date may be approximate).     Assessment:   This is a routine wellness examination for Rebecca [name].  Hearing/Vision screen Hearing Screening - Comments:: Denies hearing difficulties   Vision Screening - Comments:: Glasses - [facility] - UTD on eye exams  Dietary issues and exercise activities discussed:     Goals Addressed             This Visit's Progress    Patient Stated       Lose weight and make smart food choices.       Depression Screen    08/04/2022    9:18 AM 06/10/2022   10:32 AM 05/06/2022    9:30 AM 01/05/2022    2:44 PM 11/24/2020    9:39 AM 11/27/2018    2:27 PM 11/25/2017    2:43 PM  PHQ 2/9 Scores  PHQ - 2 Score 0 0 0 0 0 0 0  PHQ- 9 Score 0 0 1 2       Fall Risk    07/28/2022    1:07 PM 06/10/2022   10:31 AM 05/06/2022    9:29 AM 11/24/2020    9:39 AM 12/03/2019   11:30 AM  Fall Risk   Falls in the past year? 0 0 0 0 1  Number falls in past yr: 0 0 0    Injury with Fall? 0 0 0    Risk for fall due to : No Fall Risks No Fall Risks No Fall Risks    Follow up  Falls evaluation completed;Education provided;Falls prevention discussed Falls evaluation completed Falls evaluation completed Falls evaluation completed Falls evaluation completed    MEDICARE RISK AT HOME:   TIMED UP AND GO:  Was the test performed?  No    Cognitive Function:        08/04/2022    9:22 AM  6CIT Screen  What Year? 0 points  What month? 0 points  What time? 0 points  Count back from 20 0 points  Months in  reverse 0 points  Repeat phrase 2 points  Total Score 2 points    Immunizations Immunization History  Administered Date(s) Administered   COVID-19, mRNA, vaccine(Comirnaty)12 years and older 03/19/2022   Fluad Quad(high Dose 65+) 10/10/2018, 12/03/2019, 11/24/2020   Influenza,inj,Quad PF,6+ Mos 11/20/2012, 10/30/2013, 03/12/2015, 12/16/2015, 09/14/2016, 11/25/2017   Influenza-Unspecified 10/03/2021   PFIZER(Purple Top)SARS-COV-2 Vaccination 03/30/2019, 04/24/2019, 12/01/2019   PPD Test 11/02/2016   Pfizer Covid-19 Vaccine Bivalent Booster 11yrs & up 12/19/2020   Pneumococcal Conjugate-13 11/27/2018   Pneumococcal Polysaccharide-23 12/19/2019   Pneumococcal-Unspecified 10/06/2020   Respiratory Syncytial Virus Vaccine,Recomb Aduvanted(Arexvy) 03/18/2022   Td 01/03/2003   Tdap 11/20/2012   Unspecified SARS-COV-2 Vaccination 10/06/2020   Zoster Recombinant(Shingrix) 12/19/2020, 02/25/2021    TDAP status: Up to date  Flu Vaccine status: Up to date  Pneumococcal vaccine status: Up to date  Covid-19 vaccine status: Information provided on how to obtain vaccines.   Qualifies for Shingles Vaccine? Yes   Zostavax completed  unknown   Shingrix Completed?: Yes  Screening Tests Health Maintenance  Topic Date Due   Hepatitis C Screening  Never done   COVID-19 Vaccine (7 - 2023-24 season) 05/14/2022   INFLUENZA VACCINE  08/05/2022   DTaP/Tdap/Td (3 - Td or Tdap) 11/21/2022   Medicare Annual Wellness (AWV)  08/04/2023   MAMMOGRAM  08/28/2023   Colonoscopy  07/14/2026   Pneumonia Vaccine 77+ Years old  Completed   DEXA SCAN  Completed   Zoster Vaccines- Shingrix  Completed   HPV VACCINES  Aged Out    Health Maintenance  Health Maintenance Due  Topic Date Due   Hepatitis C Screening  Never done   COVID-19 Vaccine (7 - 2023-24 season) 05/14/2022    Colorectal cancer screening: Type of screening: Colonoscopy. Completed 07/13/21. Repeat every 5 years  Mammogram status:  Completed 08/27/21. Repeat every year appointment scheduled 08/30/22  Bone Density status: Completed 12/20/18. Results reflect: Bone density results: NORMAL. Repeat every 5 years.  Lung Cancer Screening: (Low Dose CT Chest recommended if Age 78-80 years, 20 pack-year currently smoking OR have quit w/in 15years.) does not qualify.   Lung Cancer Screening Referral: no  Additional Screening:  Hepatitis C Screening: does qualify; Completed DUE  Vision Screening: Recommended annual ophthalmology exams for early detection of glaucoma and other disorders of the eye. Is the patient up to date with their annual eye exam?  Yes  Who is the provider or what is the name of the office in which the patient attends annual eye exams? [facility] If pt is not established with a provider, would they like to be referred to a provider to establish care? Yes .   Dental Screening: Recommended annual dental exams for proper oral hygiene    Community Resource Referral / Chronic Care Management: CRR required this visit?  No   CCM required this visit?  No     Plan:     I have personally reviewed and noted the following in the patient's chart:   Medical and  social history Use of alcohol, tobacco or illicit drugs  Current medications and supplements including opioid prescriptions. Patient is not currently taking opioid prescriptions. Functional ability and status Nutritional status Physical activity Advanced directives List of other physicians Hospitalizations, surgeries, and ER visits in previous 12 months Vitals Screenings to include cognitive, depression, and falls Referrals and appointments  In addition, I have reviewed and discussed with patient certain preventive protocols, quality metrics, and best practice recommendations. A written personalized care plan for preventive services as well as general preventive health recommendations were provided to patient.     Maryan Puls,  LPN   1/61/0960   After Visit Summary: (MyChart) Due to this being a telephonic visit, the after visit summary with patients personalized plan was offered to patient via MyChart   Nurse Notes: none

## 2022-08-05 DIAGNOSIS — K649 Unspecified hemorrhoids: Secondary | ICD-10-CM | POA: Diagnosis not present

## 2022-08-05 DIAGNOSIS — E785 Hyperlipidemia, unspecified: Secondary | ICD-10-CM | POA: Diagnosis not present

## 2022-08-05 DIAGNOSIS — Z6833 Body mass index (BMI) 33.0-33.9, adult: Secondary | ICD-10-CM | POA: Diagnosis not present

## 2022-08-05 DIAGNOSIS — K760 Fatty (change of) liver, not elsewhere classified: Secondary | ICD-10-CM | POA: Diagnosis not present

## 2022-08-05 DIAGNOSIS — I10 Essential (primary) hypertension: Secondary | ICD-10-CM | POA: Diagnosis not present

## 2022-08-05 DIAGNOSIS — R32 Unspecified urinary incontinence: Secondary | ICD-10-CM | POA: Diagnosis not present

## 2022-08-05 DIAGNOSIS — E876 Hypokalemia: Secondary | ICD-10-CM | POA: Diagnosis not present

## 2022-08-05 DIAGNOSIS — M1990 Unspecified osteoarthritis, unspecified site: Secondary | ICD-10-CM | POA: Diagnosis not present

## 2022-08-05 DIAGNOSIS — H409 Unspecified glaucoma: Secondary | ICD-10-CM | POA: Diagnosis not present

## 2022-08-05 DIAGNOSIS — E669 Obesity, unspecified: Secondary | ICD-10-CM | POA: Diagnosis not present

## 2022-08-05 DIAGNOSIS — I700 Atherosclerosis of aorta: Secondary | ICD-10-CM | POA: Diagnosis not present

## 2022-08-06 ENCOUNTER — Ambulatory Visit: Payer: BC Managed Care – PPO | Admitting: Family Medicine

## 2022-08-19 DIAGNOSIS — G4710 Hypersomnia, unspecified: Secondary | ICD-10-CM | POA: Diagnosis not present

## 2022-08-23 ENCOUNTER — Encounter: Payer: Self-pay | Admitting: Family Medicine

## 2022-08-23 ENCOUNTER — Ambulatory Visit (INDEPENDENT_AMBULATORY_CARE_PROVIDER_SITE_OTHER): Payer: Medicare HMO | Admitting: Family Medicine

## 2022-08-23 VITALS — BP 130/71 | HR 62 | Temp 97.8°F | Ht 67.0 in | Wt 215.5 lb

## 2022-08-23 DIAGNOSIS — L659 Nonscarring hair loss, unspecified: Secondary | ICD-10-CM | POA: Insufficient documentation

## 2022-08-23 DIAGNOSIS — I10 Essential (primary) hypertension: Secondary | ICD-10-CM

## 2022-08-23 LAB — IRON: Iron: 85 ug/dL (ref 42–145)

## 2022-08-23 LAB — CBC WITH DIFFERENTIAL/PLATELET
Basophils Absolute: 0 10*3/uL (ref 0.0–0.1)
Basophils Relative: 0.9 % (ref 0.0–3.0)
Eosinophils Absolute: 0.1 10*3/uL (ref 0.0–0.7)
Eosinophils Relative: 4.2 % (ref 0.0–5.0)
HCT: 40.9 % (ref 36.0–46.0)
Hemoglobin: 13.5 g/dL (ref 12.0–15.0)
Lymphocytes Relative: 38.9 % (ref 12.0–46.0)
Lymphs Abs: 1.1 10*3/uL (ref 0.7–4.0)
MCHC: 32.9 g/dL (ref 30.0–36.0)
MCV: 93.8 fl (ref 78.0–100.0)
Monocytes Absolute: 0.3 10*3/uL (ref 0.1–1.0)
Monocytes Relative: 11.6 % (ref 3.0–12.0)
Neutro Abs: 1.3 10*3/uL — ABNORMAL LOW (ref 1.4–7.7)
Neutrophils Relative %: 44.4 % (ref 43.0–77.0)
Platelets: 297 10*3/uL (ref 150.0–400.0)
RBC: 4.37 Mil/uL (ref 3.87–5.11)
RDW: 13.9 % (ref 11.5–15.5)
WBC: 2.8 10*3/uL — ABNORMAL LOW (ref 4.0–10.5)

## 2022-08-23 LAB — MAGNESIUM: Magnesium: 2 mg/dL (ref 1.5–2.5)

## 2022-08-23 LAB — VITAMIN B12: Vitamin B-12: 1316 pg/mL — ABNORMAL HIGH (ref 211–911)

## 2022-08-23 LAB — BASIC METABOLIC PANEL
BUN: 14 mg/dL (ref 6–23)
CO2: 29 mEq/L (ref 19–32)
Calcium: 9.2 mg/dL (ref 8.4–10.5)
Chloride: 102 mEq/L (ref 96–112)
Creatinine, Ser: 0.72 mg/dL (ref 0.40–1.20)
GFR: 85.37 mL/min (ref >60.00)
Glucose, Bld: 119 mg/dL — ABNORMAL HIGH (ref 70–99)
Potassium: 4 mEq/L (ref 3.5–5.1)
Sodium: 140 mEq/L (ref 135–145)

## 2022-08-23 LAB — VITAMIN D 25 HYDROXY (VIT D DEFICIENCY, FRACTURES): VITD: 41.2 ng/mL (ref 30.00–100.00)

## 2022-08-23 NOTE — Patient Instructions (Addendum)
Try to avoid chemicals and traction and heat on hair   Labs today for hair loss Menopause and stress and recent weight loss may contribute   You can try topical Rogaine of over the counter   Take care of yourself

## 2022-08-23 NOTE — Progress Notes (Signed)
Subjective:    Patient ID: Rebecca [name], female    DOB: 1955-06-06, 67 y.o.   MRN: [id number]  HPI  Wt Readings from Last 3 Encounters:  08/23/22 215 lb 8 oz (97.8 kg)  08/04/22 217 lb (98.4 kg)  07/19/22 222 lb 12.8 oz (101.1 kg)   33.75 kg/m  Vitals:   08/23/22 0822 08/23/22 0855  BP: (!) 146/78 130/71  Pulse: 62   Temp: 97.8 F (36.6 C)   SpO2: 97%    Working on weight loss Eating better/healthier   Pt presents with c/o hair loss Interested in some labs- hormones/ D and iron   She has had a hysterectomy/ partial   Concerned about hair  Hair started thinning since 2017  It grows and then breaks easily  Top of the head  Temples   It grows 6 months and then breaks  Also not growing like it used to   Last used a relaxer- oct 2023  Uses hair moisturizers and oils   No itching  Once had a small scab on right side of her head   No hair loss in the family   Supplements  Hair vitamin - with biotin  Fish oil  Ran out of D3 Takes extra B12 also  Mag ox 400 mg daily   Stress Was caring for her parents  Then bad relationship     Lab Results  Component Value Date   NA 140 08/23/2022   K 4.0 08/23/2022   CO2 29 08/23/2022   GLUCOSE 119 (H) 08/23/2022   BUN 14 08/23/2022   CREATININE 0.72 08/23/2022   CALCIUM 9.2 08/23/2022   GFR 85.37 08/23/2022   EGFR 94.0 10/24/2021   GFRNONAA >60 08/03/2020   Lab Results  Component Value Date   ALT 13 01/05/2022   AST 16 01/05/2022   ALKPHOS 51 01/05/2022   BILITOT 0.4 01/05/2022   Lab Results  Component Value Date   WBC 2.8 (L) 08/23/2022   HGB 13.5 08/23/2022   HCT 40.9 08/23/2022   MCV 93.8 08/23/2022   PLT 297.0 08/23/2022   Lab Results  Component Value Date   TSH 1.28 10/14/2020       Patient Active Problem List   Diagnosis Date Noted   Hair loss 08/23/2022   Class 2 severe obesity due to excess calories with serious comorbidity and body mass index (BMI) of 35.0 to 35.9 in adult [facility])  05/08/2022   Atypical chest pain 05/06/2022   Hepatic steatosis 01/05/2022   Crepitus of cervical spine 04/30/2021   Unilateral occipital headache 04/30/2021   Medicare annual wellness visit, subsequent 11/24/2020   Low back pain 10/14/2020   Breast pain 07/22/2020   Insomnia 07/23/2019   Heart rate problem 05/29/2019   Estrogen deficiency 11/27/2018   Encounter for pre-employment examination 11/02/2016   Fungal nail infection 04/27/2016   Sinusitis, chronic 03/12/2015   Fatigue 05/24/2014   Hyperlipidemia 10/30/2013   History of radius fracture 11/23/2012   Undiagnosed cardiac murmurs 11/23/2012   Family history of colon cancer requiring screening colonoscopy 09/14/2011   Routine general medical examination at a health care facility 09/05/2011   Mixed incontinence urge and stress 04/14/2011   Hemorrhoids, internal, with bleeding 10/12/2010   External hemorrhoids 11/04/2009   ALLERGIC RHINITIS 08/13/2009   Hypokalemia 04/28/2007   Prediabetes 11/15/2006   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 > 20 yr.   Radius fracture 11/22/2012   left, comminuted   Past Surgical History:  Procedure Laterality Date   ABDOMINAL HYSTERECTOMY  06/02/2000   partial   COLONOSCOPY  03/2006   COLONOSCOPY WITH PROPOFOL  11/15/2011   ENDOMETRIAL ABLATION     OPEN REDUCTION INTERNAL FIXATION (ORIF) DISTAL RADIAL FRACTURE Left 11/29/2012   Procedure: OPEN REDUCTION INTERNAL FIXATION (ORIF) LEFT  DISTAL RADIAL FRACTURE;  Surgeon: Nicki Reaper, MD;  Location: [facility];  Service: Orthopedics;  Laterality: Left;   PARTIAL  HYSTERECTOMY     Social History   Tobacco Use   Smoking status: Never   Smokeless tobacco: Never  Vaping Use   Vaping status: Never Used  Substance Use Topics   Alcohol use: No   Drug use: No   Family History  Problem Relation Age of Onset   Heart attack Mother    Heart disease Mother    Hypertension Mother    Colon cancer Mother    Diabetes Mother    Dementia Mother    Heart disease Father    Hypertension Father    Coronary artery disease Father        with CABG   Dementia Father    Colon polyps Sister    Breast cancer Maternal Aunt    Diabetes Maternal Aunt    Colon cancer Maternal Uncle    Colon cancer Paternal Uncle    Esophageal cancer Neg Hx    Rectal cancer Neg Hx    Stomach cancer Neg Hx    Allergies  Allergen Reactions   Amoxicillin Hives and Swelling   Spironolactone Diarrhea   Augmentin [Amoxicillin-Pot Clavulanate] Diarrhea    Tore stomach up   Shrimp [Shellfish Allergy] Hives and Swelling   Current Outpatient Medications on File Prior to Visit  Medication Sig Dispense Refill   benazepril (LOTENSIN) 20 MG tablet Take 1 tablet (20 mg total) by mouth daily. 90 tablet 0   cholecalciferol (VITAMIN D3) 25 MCG (1000 UNIT) tablet Take 1,000 Units by mouth daily.     Cyanocobalamin (B-12) 5000 MCG CAPS Take 1 capsule by mouth every other day.     diclofenac (VOLTAREN) 75 MG EC tablet Take 1 tablet (75 mg total) by mouth 2 (two) times daily. Bid prn pain 60 tablet 2   fluticasone (FLONASE) 50 MCG/ACT nasal spray Place 2 sprays into both nostrils daily as needed for allergies or rhinitis. 48 g 3   hydrochlorothiazide (HYDRODIURIL) 50 MG tablet Take 1 tablet (50 mg total) by mouth daily. 90 tablet 3   hydrocortisone (ANUCORT-HC) 25 MG suppository UNWRAP AND INSERT ONE SUPPOSITORY RECTALLY EVERY NIGHT AT BEDTIME AS NEEDED FOR HEMORRHOIDS 12 suppository 1   hydrocortisone (ANUSOL-HC) 2.5 % rectal cream Apply to affected area on hemorrhoids once daily as needed 30 g  0   latanoprost (XALATAN) 0.005 % ophthalmic solution Place 1 drop into both eyes.      lidocaine (XYLOCAINE) 2 % jelly Apply 1 Application topically 3 (three) times daily as needed (rectum). 30 mL 1   magnesium oxide (MAG-OX) 400 (240 Mg) MG tablet Take 400 mg by mouth daily.  Multiple Vitamin (MULTIVITAMIN) tablet Take 1 tablet by mouth daily.       Omega-3 Fatty Acids (FISH OIL CONCENTRATE PO) Take 1 capsule by mouth daily.      potassium chloride (KLOR-CON) 10 MEQ tablet TAKE 1 TABLET BY MOUTH EVERY DAY 90 tablet 2   No current facility-administered medications on file prior to visit.    Review of Systems  Constitutional:  Negative for activity change, appetite change, fatigue, fever and unexpected weight change.  HENT:  Negative for congestion, ear pain, rhinorrhea, sinus pressure and sore throat.   Eyes:  Negative for pain, redness and visual disturbance.  Respiratory:  Negative for cough, shortness of breath and wheezing.   Cardiovascular:  Negative for chest pain and palpitations.  Gastrointestinal:  Negative for abdominal pain, blood in stool, constipation and diarrhea.  Endocrine: Negative for polydipsia and polyuria.  Genitourinary:  Negative for dysuria, frequency and urgency.  Musculoskeletal:  Negative for arthralgias, back pain and myalgias.  Skin:  Negative for pallor and rash.       Hair loss  Breakage and thinning  Allergic/Immunologic: Negative for environmental allergies.  Neurological:  Negative for dizziness, syncope and headaches.  Hematological:  Negative for adenopathy. Does not bruise/bleed easily.  Psychiatric/Behavioral:  Negative for decreased concentration and dysphoric mood. The patient is not nervous/anxious.        Stress has been very high       Objective:   Physical Exam Constitutional:      General: She is not in acute distress.    Appearance: Normal appearance. She is obese. She is not ill-appearing or diaphoretic.  HENT:     Mouth/Throat:      Mouth: Mucous membranes are moist.  Eyes:     General: No scleral icterus.       Right eye: No discharge.        Left eye: No discharge.     Conjunctiva/sclera: Conjunctivae normal.     Pupils: Pupils are equal, round, and reactive to light.  Cardiovascular:     Rate and Rhythm: Normal rate and regular rhythm.     Heart sounds: Normal heart sounds.  Pulmonary:     Effort: Pulmonary effort is normal. No respiratory distress.     Breath sounds: Normal breath sounds. No wheezing.  Musculoskeletal:     Cervical back: Neck supple.  Lymphadenopathy:     Cervical: No cervical adenopathy.  Skin:    Comments: Scalp appears healthy  No scale or growths  No focal alopecia   Thinner follicle dispersement on crown and temples  No significant breakage of hair noted today  Brows and lashes appear normal   Neurological:     Mental Status: She is alert.     Cranial Nerves: No cranial nerve deficit.     Motor: No weakness.  Psychiatric:        Mood and Affect: Mood normal.           Assessment & Plan:   Problem List Items Addressed This Visit       Cardiovascular and Mediastinum   Essential hypertension    BP: 130/71  Running well at home She did stop amlodipine due to ? Cause of hair loss Now taking Benazepril 20 mg daily  Hctz 50 mg        Relevant Orders   Basic metabolic panel (Completed)   CBC with Differential/Platelet (Completed)   Iron (Completed)     Other   Hair loss - Primary  Ongoing for years and gradual  No discrete alopecia  Stressors /recent / age and hormone change may play a role  Also intentional weight loss (which can cause temporary hair loss)  Thinning at crown and temples  Some breakage as well  Encouraged to avoid chemicals and heat and traction  Labs today  Consider rogaine trial topical over the counter Consider derm referral in future        Relevant Orders   Basic metabolic panel (Completed)   CBC with  Differential/Platelet (Completed)   Iron (Completed)   VITAMIN D 25 Hydroxy (Vit-D Deficiency, Fractures) (Completed)   Vitamin B12 (Completed)   Magnesium (Completed)

## 2022-08-23 NOTE — Assessment & Plan Note (Signed)
BP: 130/71  Running well at home She did stop amlodipine due to ? Cause of hair loss Now taking Benazepril 20 mg daily  Hctz 50 mg

## 2022-08-23 NOTE — Assessment & Plan Note (Addendum)
Ongoing for years and gradual  No discrete alopecia  Stressors /recent / age and hormone change may play a role  Also intentional weight loss (which can cause temporary hair loss)  Thinning at crown and temples  Some breakage as well  Encouraged to avoid chemicals and heat and traction  Labs today  Consider rogaine trial topical over the counter Consider derm referral in future

## 2022-08-27 ENCOUNTER — Other Ambulatory Visit: Payer: Self-pay

## 2022-08-27 MED ORDER — BENAZEPRIL HCL 20 MG PO TABS: 20.0000 mg | ORAL_TABLET | Freq: Every day | ORAL | 0 refills | Status: DC

## 2022-08-27 NOTE — Telephone Encounter (Signed)
Requested Prescriptions   Signed Prescriptions Disp Refills   benazepril (LOTENSIN) 20 MG tablet 90 tablet 0    Sig: Take 1 tablet (20 mg total) by mouth daily.    Authorizing Provider: Debbe Odea    Ordering User: Guerry Minors

## 2022-08-30 ENCOUNTER — Ambulatory Visit
Admission: RE | Admit: 2022-08-30 | Discharge: 2022-08-30 | Disposition: A | Discharge status: Home / Self Care | Payer: Medicare HMO | Source: Ambulatory Visit | Attending: Family Medicine | Admitting: Family Medicine

## 2022-08-30 DIAGNOSIS — Z1231 Encounter for screening mammogram for malignant neoplasm of breast: Secondary | ICD-10-CM

## 2022-09-14 ENCOUNTER — Ambulatory Visit (INDEPENDENT_AMBULATORY_CARE_PROVIDER_SITE_OTHER): Payer: Medicare HMO | Admitting: Family Medicine

## 2022-09-14 ENCOUNTER — Encounter: Payer: Self-pay | Admitting: Family Medicine

## 2022-09-14 VITALS — BP 136/80 | HR 59 | Temp 97.8°F | Ht 67.0 in | Wt 214.4 lb

## 2022-09-14 DIAGNOSIS — I10 Essential (primary) hypertension: Secondary | ICD-10-CM

## 2022-09-14 DIAGNOSIS — D72819 Decreased white blood cell count, unspecified: Secondary | ICD-10-CM | POA: Insufficient documentation

## 2022-09-14 DIAGNOSIS — R5382 Chronic fatigue, unspecified: Secondary | ICD-10-CM

## 2022-09-14 MED ORDER — HYDROCHLOROTHIAZIDE 50 MG PO TABS: 50.0000 mg | ORAL_TABLET | Freq: Every day | ORAL | 3 refills | Status: DC

## 2022-09-14 NOTE — Assessment & Plan Note (Signed)
Last wbc 2.8  Will re check today with path report  Has been fatigued  No signs and symptoms of infection (viral or bacterial)

## 2022-09-14 NOTE — Assessment & Plan Note (Signed)
Blood pressure was up a bit today  Pt notes she lost her hydrochlorothiazide in the move and needs new prescription sent   BP: 136/80  Getting back to walking and better eating now that she has moved   Continues Benazepril 20 mg daily  Hydrochlorothiazide 50 mg daily   Lab ordered

## 2022-09-14 NOTE — Assessment & Plan Note (Signed)
Very fatigued last week during move/ now doing better since the weekend  In retrospect may have overdone it  Also noted cold intolerance  Had sleep study-per pt no signif apnea (is going to get fitted for dental appliance for snoring with her dentist) Pt notes mood is very good  Overall feeling better but was concerned  Reviewed latest labs  Exam normal   Lab ordered today incl cbc and tsh  Low wbc last time  Pend results

## 2022-09-14 NOTE — Progress Notes (Signed)
Subjective:    Patient ID: Rebecca [name], female    DOB: 05-11-1955, 67 y.o.   MRN: [id number]  HPI  Wt Readings from Last 3 Encounters:  09/14/22 214 lb 6 oz (97.2 kg)  08/23/22 215 lb 8 oz (97.8 kg)  08/04/22 217 lb (98.4 kg)   33.58 kg/m  Vitals:   09/14/22 1428 09/14/22 1448  BP: (!) 148/84 136/80  Pulse: (!) 59   Temp: 97.8 F (36.6 C)   SpO2: 95%     Pt presents with c/o fatigue     Felt tired all last week  Just moved  Did a lot of heavy lifting  May have over done it   A little more cold natured recently  Head to turn the heat on one day   Tried to drink water (up to 80 ox per day)  Was unable to walk due to the move  Tried to get enough sleep   Emotionally pretty on key Is happy about the move  Not overly worried    Thinks she is eating properly  Produce  Whole grains Protein - baked chicken and fish , some dairy and some beans    Then over weekend felt better  Is back to her usual walking   Tested for sleep apnea did not have it  May see dentist for snoring   Mammogram neg    BP Readings from Last 3 Encounters:  09/14/22 136/80  08/23/22 130/71  08/04/22 111/71    HTN Benazepril 20 mg daily  Hydrochlorothiazide 50 mg daily -lost her prescription during move/needs new one sent   Took amlodipine in past- thought it caused hair loss   Pulse Readings from Last 3 Encounters:  09/14/22 (!) 59  08/23/22 62  07/19/22 65   Lab Results  Component Value Date   NA 140 08/23/2022   K 4.0 08/23/2022   CO2 29 08/23/2022   GLUCOSE 119 (H) 08/23/2022   BUN 14 08/23/2022   CREATININE 0.72 08/23/2022   CALCIUM 9.2 08/23/2022   GFR 85.37 08/23/2022   EGFR 94.0 10/24/2021   GFRNONAA >60 08/03/2020   Lab Results  Component Value Date   ALT 13 01/05/2022   AST 16 01/05/2022   ALKPHOS 51 01/05/2022   BILITOT 0.4 01/05/2022   Lab Results  Component Value Date   WBC 2.8 (L) 08/23/2022   HGB 13.5 08/23/2022   HCT 40.9 08/23/2022    MCV 93.8 08/23/2022   PLT 297.0 08/23/2022   Lab Results  Component Value Date   TSH 1.28 10/14/2020   Lab Results  Component Value Date   VITAMINB12 1,316 (H) 08/23/2022   Last vitamin D Lab Results  Component Value Date   VD25OH 41.20 08/23/2022      Patient Active Problem List   Diagnosis Date Noted   Leukopenia 09/14/2022   Hair loss 08/23/2022   Class 2 severe obesity due to excess calories with serious comorbidity and body mass index (BMI) of 35.0 to 35.9 in adult [facility]) 05/08/2022   Atypical chest pain 05/06/2022   Hepatic steatosis 01/05/2022   Crepitus of cervical spine 04/30/2021   Unilateral occipital headache 04/30/2021   Medicare annual wellness visit, subsequent 11/24/2020   Low back pain 10/14/2020   Breast pain 07/22/2020   Insomnia 07/23/2019   Heart rate problem 05/29/2019   Estrogen deficiency 11/27/2018   Encounter for pre-employment examination 11/02/2016   Fungal nail infection 04/27/2016   Sinusitis, chronic 03/12/2015   Fatigue 05/24/2014  Hyperlipidemia 10/30/2013   History of radius fracture 11/23/2012   Undiagnosed cardiac murmurs 11/23/2012   Family history of colon cancer requiring screening colonoscopy 09/14/2011   Routine general medical examination at a health care facility 09/05/2011   Mixed incontinence urge and stress 04/14/2011   Hemorrhoids, internal, with bleeding 10/12/2010   External hemorrhoids 11/04/2009   ALLERGIC RHINITIS 08/13/2009   Hypokalemia 04/28/2007   Prediabetes 11/15/2006   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 > 20 yr.   Radius fracture  11/22/2012   left, comminuted   Past Surgical History:  Procedure Laterality Date   ABDOMINAL HYSTERECTOMY  06/02/2000   partial   COLONOSCOPY  03/2006   COLONOSCOPY WITH PROPOFOL  11/15/2011   ENDOMETRIAL ABLATION     OPEN REDUCTION INTERNAL FIXATION (ORIF) DISTAL RADIAL FRACTURE Left 11/29/2012   Procedure: OPEN REDUCTION INTERNAL FIXATION (ORIF) LEFT  DISTAL RADIAL FRACTURE;  Surgeon: Nicki Reaper, MD;  Location: [facility];  Service: Orthopedics;  Laterality: Left;   PARTIAL HYSTERECTOMY     Social History   Tobacco Use   Smoking status: Never   Smokeless tobacco: Never  Vaping Use   Vaping status: Never Used  Substance Use Topics   Alcohol use: No   Drug use: No   Family History  Problem Relation Age of Onset   Heart attack Mother    Heart disease Mother    Hypertension Mother    Colon cancer Mother    Diabetes Mother    Dementia Mother    Heart disease Father    Hypertension Father    Coronary artery disease Father        with CABG   Dementia Father    Colon polyps Sister    Breast cancer Maternal Aunt    Diabetes Maternal Aunt    Colon cancer Maternal Uncle    Colon cancer Paternal Uncle    Esophageal cancer Neg Hx    Rectal cancer Neg Hx    Stomach cancer Neg Hx    Allergies  Allergen Reactions   Amoxicillin Hives and Swelling   Spironolactone Diarrhea   Augmentin [Amoxicillin-Pot Clavulanate] Diarrhea    Tore stomach up   Shrimp [Shellfish Allergy] Hives and Swelling   Current Outpatient Medications on File Prior to Visit  Medication Sig Dispense Refill   benazepril (LOTENSIN) 20 MG tablet Take 1 tablet (20 mg total) by mouth daily. 90 tablet 0   cholecalciferol (VITAMIN D3) 25 MCG (1000 UNIT) tablet Take 1,000 Units by mouth daily.     Cyanocobalamin (B-12) 5000 MCG CAPS Take 1 capsule by mouth every other day.     diclofenac (VOLTAREN) 75 MG EC tablet Take 1 tablet (75 mg total) by mouth 2 (two) times daily. Bid prn pain 60 tablet 2    fluticasone (FLONASE) 50 MCG/ACT nasal spray Place 2 sprays into both nostrils daily as needed for allergies or rhinitis. 48 g 3   hydrocortisone (ANUCORT-HC) 25 MG suppository UNWRAP AND INSERT ONE SUPPOSITORY RECTALLY EVERY NIGHT AT BEDTIME AS NEEDED FOR  HEMORRHOIDS 12 suppository 1   hydrocortisone (ANUSOL-HC) 2.5 % rectal cream Apply to affected area on hemorrhoids once daily as needed 30 g 0   latanoprost (XALATAN) 0.005 % ophthalmic solution Place 1 drop into both eyes.      lidocaine (XYLOCAINE) 2 % jelly Apply 1 Application topically 3 (three) times daily as needed (rectum). 30 mL 1   magnesium oxide (MAG-OX) 400 (240 Mg) MG tablet Take 400 mg by mouth daily.     Multiple Vitamin (MULTIVITAMIN) tablet Take 1 tablet by mouth daily.       Omega-3 Fatty Acids (FISH OIL CONCENTRATE PO) Take 1 capsule by mouth daily.      potassium chloride (KLOR-CON) 10 MEQ tablet TAKE 1 TABLET BY MOUTH EVERY DAY 90 tablet 2   No current facility-administered medications on file prior to visit.    Review of Systems  Constitutional:  Positive for fatigue. Negative for activity change, appetite change, fever and unexpected weight change.       Fatigue is improved from last week   HENT:  Negative for congestion, ear pain, rhinorrhea, sinus pressure and sore throat.   Eyes:  Negative for pain, redness and visual disturbance.  Respiratory:  Negative for cough, shortness of breath and wheezing.   Cardiovascular:  Negative for chest pain and palpitations.  Gastrointestinal:  Negative for abdominal pain, blood in stool, constipation and diarrhea.  Endocrine: Negative for polydipsia and polyuria.  Genitourinary:  Negative for dysuria, frequency and urgency.  Musculoskeletal:  Negative for arthralgias, back pain and myalgias.  Skin:  Negative for pallor and rash.  Allergic/Immunologic: Negative for environmental allergies.  Neurological:  Negative for dizziness, syncope and headaches.  Hematological:   Negative for adenopathy. Does not bruise/bleed easily.  Psychiatric/Behavioral:  Negative for decreased concentration and dysphoric mood. The patient is not nervous/anxious.        Objective:   Physical Exam Constitutional:      General: She is not in acute distress.    Appearance: Normal appearance. She is well-developed. She is obese. She is not ill-appearing or diaphoretic.  HENT:     Head: Normocephalic and atraumatic.     Right Ear: Tympanic membrane and ear canal normal.     Left Ear: Tympanic membrane and ear canal normal.     Mouth/Throat:     Mouth: Mucous membranes are moist.  Eyes:     General:        Right eye: No discharge.        Left eye: No discharge.     Conjunctiva/sclera: Conjunctivae normal.     Pupils: Pupils are equal, round, and reactive to light.  Neck:     Thyroid: No thyromegaly.     Vascular: No carotid bruit or JVD.  Cardiovascular:     Rate and Rhythm: Regular rhythm. Bradycardia present.     Heart sounds: Normal heart sounds. No murmur heard.    No gallop.  Pulmonary:     Effort: Pulmonary effort is normal. No respiratory distress.     Breath sounds: Normal breath sounds. No stridor. No wheezing, rhonchi or rales.  Abdominal:     General: There is no distension or abdominal bruit.     Palpations: Abdomen is soft. There is no mass.     Tenderness: There is no abdominal tenderness.  Musculoskeletal:     Cervical back: Normal range of motion and neck supple. No tenderness.     Right lower leg: No edema.     Left  lower leg: No edema.  Lymphadenopathy:     Cervical: No cervical adenopathy.  Skin:    General: Skin is warm and dry.     Coloration: Skin is not pale.     Findings: No rash.  Neurological:     Mental Status: She is alert.     Cranial Nerves: No cranial nerve deficit.     Coordination: Coordination normal.     Deep Tendon Reflexes: Reflexes are normal and symmetric. Reflexes normal.  Psychiatric:        Mood and Affect: Mood  normal.     Comments: Mood is good today           Assessment & Plan:   Problem List Items Addressed This Visit       Cardiovascular and Mediastinum   Essential hypertension    Blood pressure was up a bit today  Pt notes she lost her hydrochlorothiazide in the move and needs new prescription sent   BP: 136/80  Getting back to walking and better eating now that she has moved   Continues Benazepril 20 mg daily  Hydrochlorothiazide 50 mg daily   Lab ordered       Relevant Medications   hydrochlorothiazide (HYDRODIURIL) 50 MG tablet   Other Relevant Orders   Comprehensive metabolic panel   TSH   CBC with Differential/Platelet     Other   Fatigue - Primary    Very fatigued last week during move/ now doing better since the weekend  In retrospect may have overdone it  Also noted cold intolerance  Had sleep study-per pt no signif apnea (is going to get fitted for dental appliance for snoring with her dentist) Pt notes mood is very good  Overall feeling better but was concerned  Reviewed latest labs  Exam normal   Lab ordered today incl cbc and tsh  Low wbc last time  Pend results       Relevant Orders   Comprehensive metabolic panel   TSH   CBC with Differential/Platelet   Leukopenia    Last wbc 2.8  Will re check today with path report  Has been fatigued  No signs and symptoms of infection (viral or bacterial)       Relevant Orders   Pathologist smear review   CBC with Differential/Platelet

## 2022-09-14 NOTE — Patient Instructions (Addendum)
Make sure you get protein with every meal   The following are examples of protein in diet  Meat  Fish  Eggs  Dairy products  Soy products  Oat milk  Almond milk Nuts and nut butters  Dried beans     Take care of yourself  Get rest when needed Labs today

## 2022-09-15 LAB — COMPREHENSIVE METABOLIC PANEL
ALT: 16 U/L (ref 0–35)
AST: 21 U/L (ref 0–37)
Albumin: 4.2 g/dL (ref 3.5–5.2)
Alkaline Phosphatase: 55 U/L (ref 39–117)
BUN: 15 mg/dL (ref 6–23)
CO2: 28 meq/L (ref 19–32)
Calcium: 9.6 mg/dL (ref 8.4–10.5)
Chloride: 100 meq/L (ref 96–112)
Creatinine, Ser: 0.78 mg/dL (ref 0.40–1.20)
GFR: 77.52 mL/min (ref >60.00)
Glucose, Bld: 91 mg/dL (ref 70–99)
Potassium: 3.8 meq/L (ref 3.5–5.1)
Sodium: 138 meq/L (ref 135–145)
Total Bilirubin: 0.4 mg/dL (ref 0.2–1.2)
Total Protein: 7.1 g/dL (ref 6.0–8.3)

## 2022-09-15 LAB — CBC WITH DIFFERENTIAL/PLATELET
Absolute Monocytes: 511 {cells}/uL (ref 200–950)
Basophils Absolute: 39 {cells}/uL (ref 0–200)
Basophils Relative: 1 %
Eosinophils Absolute: 133 {cells}/uL (ref 15–500)
Eosinophils Relative: 3.4 %
HCT: 38.3 % (ref 35.0–45.0)
Hemoglobin: 13.2 g/dL (ref 11.7–15.5)
Lymphs Abs: 1135 {cells}/uL (ref 850–3900)
MCH: 31.2 pg (ref 27.0–33.0)
MCHC: 34.5 g/dL (ref 32.0–36.0)
MCV: 90.5 fL (ref 80.0–100.0)
MPV: 10.5 fL (ref 7.5–12.5)
Monocytes Relative: 13.1 %
Neutro Abs: 2083 {cells}/uL (ref 1500–7800)
Neutrophils Relative %: 53.4 %
Platelets: 312 10*3/uL (ref 140–400)
RBC: 4.23 10*6/uL (ref 3.80–5.10)
RDW: 12.6 % (ref 11.0–15.0)
Total Lymphocyte: 29.1 %
WBC: 3.9 10*3/uL (ref 3.8–10.8)

## 2022-09-15 LAB — PATHOLOGIST SMEAR REVIEW

## 2022-09-15 LAB — TSH: TSH: 0.89 u[IU]/mL (ref 0.35–5.50)

## 2022-09-16 ENCOUNTER — Encounter: Payer: Self-pay | Admitting: Family Medicine

## 2022-09-26 ENCOUNTER — Telehealth: Payer: Self-pay | Admitting: Family Medicine

## 2022-09-26 NOTE — Telephone Encounter (Signed)
I told her to cancel this lab work because she just had some labs early  Thanks

## 2022-09-26 NOTE — Telephone Encounter (Signed)
-----   Message from Lovena Neighbours sent at 09/09/2022  2:17 PM EDT ----- Regarding: Labs for Monday 9.23.24 Please put follow up lab orders in future. Thank you, Denny Peon

## 2022-09-27 ENCOUNTER — Other Ambulatory Visit: Payer: Medicare HMO

## 2022-09-28 ENCOUNTER — Ambulatory Visit: Payer: Medicare HMO

## 2022-09-28 ENCOUNTER — Other Ambulatory Visit: Payer: Medicare HMO

## 2022-10-19 ENCOUNTER — Ambulatory Visit: Payer: Medicare HMO | Admitting: Internal Medicine

## 2022-11-19 ENCOUNTER — Ambulatory Visit: Payer: Medicare HMO | Attending: Cardiology | Admitting: Cardiology

## 2022-11-19 ENCOUNTER — Encounter: Payer: Self-pay | Admitting: Cardiology

## 2022-11-19 VITALS — BP 150/80 | HR 56 | Ht 67.0 in | Wt 218.6 lb

## 2022-11-19 DIAGNOSIS — I10 Essential (primary) hypertension: Secondary | ICD-10-CM | POA: Diagnosis not present

## 2022-11-19 MED ORDER — BENAZEPRIL HCL 40 MG PO TABS: 40.0000 mg | ORAL_TABLET | Freq: Every day | ORAL | 0 refills | Status: DC

## 2022-11-19 NOTE — Progress Notes (Signed)
Cardiology Office Note:    Date:  11/19/2022   ID:  Rebecca [name], DOB 1955/10/26, MRN [id number]  PCP:  Judy Pimple, MD   [facility] Providers Cardiologist:  Debbe Odea, MD     Referring MD: Judy Pimple, MD   Chief Complaint  Patient presents with   Follow-up    Patient denies new or acute cardiac problems/concerns today.      History of Present Illness:    Rebecca [name] is a 67 y.o. female with a hx of hypertension who presents for follow-up.  Blood pressures seem to be okay at home.  Systolics in the 130s which is more elevated from her prior of low 100s.  Endorses eating processed/salty foods.  Past Medical History:  Diagnosis Date   Allergy    Cataract    beginnings of    Glaucoma    Heart murmur    noted on PCP exam 11/23/2012; "does not appear urgent"   Hemorrhoids    Hypertension    under control with meds., has been on med. > 20 yr.   Radius fracture 11/22/2012   left, comminuted    Past Surgical History:  Procedure Laterality Date   ABDOMINAL HYSTERECTOMY  06/02/2000   partial   COLONOSCOPY  03/2006   COLONOSCOPY WITH PROPOFOL  11/15/2011   ENDOMETRIAL ABLATION     OPEN REDUCTION INTERNAL FIXATION (ORIF) DISTAL RADIAL FRACTURE Left 11/29/2012   Procedure: OPEN REDUCTION INTERNAL FIXATION (ORIF) LEFT  DISTAL RADIAL FRACTURE;  Surgeon: Nicki Reaper, MD;  Location: [facility];  Service: Orthopedics;  Laterality: Left;   PARTIAL HYSTERECTOMY      Current Medications: Current Meds  Medication Sig   cholecalciferol (VITAMIN D3) 25 MCG (1000 UNIT) tablet Take 1,000 Units by mouth daily.   diclofenac (VOLTAREN) 75 MG EC tablet Take 1 tablet (75 mg total) by mouth 2 (two) times daily. Bid prn pain   fluticasone (FLONASE) 50 MCG/ACT nasal spray Place 2 sprays into both nostrils daily as needed for allergies or rhinitis.   hydrochlorothiazide (HYDRODIURIL) 50 MG tablet Take 1 tablet (50 mg total) by mouth daily.    hydrocortisone (ANUCORT-HC) 25 MG suppository UNWRAP AND INSERT ONE SUPPOSITORY RECTALLY EVERY NIGHT AT BEDTIME AS NEEDED FOR HEMORRHOIDS   hydrocortisone (ANUSOL-HC) 2.5 % rectal cream Apply to affected area on hemorrhoids once daily as needed   latanoprost (XALATAN) 0.005 % ophthalmic solution Place 1 drop into both eyes.    lidocaine (XYLOCAINE) 2 % jelly Apply 1 Application topically 3 (three) times daily as needed (rectum).   magnesium oxide (MAG-OX) 400 (240 Mg) MG tablet Take 400 mg by mouth daily.   Multiple Vitamin (MULTIVITAMIN) tablet Take 1 tablet by mouth daily.     Omega-3 Fatty Acids (FISH OIL CONCENTRATE PO) Take 1 capsule by mouth daily.    potassium chloride (KLOR-CON) 10 MEQ tablet TAKE 1 TABLET BY MOUTH EVERY DAY   [DISCONTINUED] benazepril (LOTENSIN) 20 MG tablet Take 1 tablet (20 mg total) by mouth daily.     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   Occupation: retired  Tobacco Use   Smoking status: Never   Smokeless tobacco: Never  Vaping Use   Vaping status: Never Used  Substance and Sexual Activity   Alcohol use: No   Drug use: No   Sexual activity: Not on file  Other Topics Concern   Not on file  Social History Narrative   Regular exercise   Social Determinants of Health   Financial Resource Strain: Low Risk  (08/04/2022)   Overall Financial Resource Strain (CARDIA)    Difficulty of Paying Living Expenses: Not hard at all  Food Insecurity: No Food Insecurity (08/04/2022)   Hunger Vital Sign    Worried About Running Out of Food in the  Last Year: Never true    Ran Out of Food in the Last Year: Never true  Transportation Needs: No Transportation Needs (08/04/2022)   PRAPARE - Administrator, Civil Service (Medical): No    Lack of Transportation (Non-Medical): No  Physical Activity: Sufficiently Active (08/04/2022)   Exercise Vital Sign    Days of Exercise per Week: 3 days    Minutes of Exercise per Session: 90 min  Recent Concern: Physical Activity - Insufficiently Active (06/09/2022)   Exercise Vital Sign    Days of Exercise per Week: 2 days    Minutes of Exercise per Session: 20 min  Stress: No Stress Concern Present (08/04/2022)   [employer] of Occupational Health - Occupational Stress Questionnaire    Feeling of Stress : Not at all  Social Connections: Moderately Isolated (08/04/2022)   Social Connection and Isolation Panel [NHANES]    Frequency of Communication with Friends and Family: More than three times a week    Frequency of Social Gatherings with Friends and Family: More than three times a week    Attends Religious Services: More than 4 times per year    Active Member of [employer] or Organizations: No    Attends Banker Meetings: Never    Marital Status: Divorced     Family History: The patient's family history includes Breast cancer in her maternal aunt; Colon cancer in her maternal uncle, mother, and paternal uncle; Colon polyps in her sister; Coronary artery disease in her father; Dementia in her father and mother; Diabetes in her maternal aunt and mother; Heart attack in her mother; Heart disease in her father and mother; Hypertension in her father and mother. There is no history of Esophageal cancer, Rectal cancer, or Stomach cancer.  ROS:   Please see the history of present illness.     All other systems reviewed and are negative.  EKGs/Labs/Other Studies Reviewed:    The following studies were reviewed today:   EKG Interpretation Date/Time:  Friday November 19 2022  09:32:12 EST Ventricular Rate:  56 PR Interval:  170 QRS Duration:  96 QT Interval:  406 QTC Calculation: 391 R Axis:   32  Text Interpretation: Sinus bradycardia Septal infarct , age undetermined Confirmed by Debbe Odea ([id number]) on 11/19/2022 9:39:49 AM    Recent Labs: 08/23/2022: Magnesium 2.0 09/14/2022: ALT 16; BUN 15; Creatinine, Ser 0.78; Hemoglobin 13.2; Platelets 312; Potassium 3.8; Sodium 138; TSH 0.89  Recent Lipid Panel    Component Value Date/Time   CHOL 206 (H) 02/24/2021 0924   TRIG 90.0 02/24/2021 0924   HDL 61.60 02/24/2021 0924   CHOLHDL 3 02/24/2021 0924   VLDL 18.0 02/24/2021 0924   LDLCALC 127 (H) 02/24/2021 0924   LDLDIRECT 107.3 08/11/2009 0817  Risk Assessment/Calculations:     Physical Exam:    VS:  BP (!) 150/80 (BP Location: Left Arm, Patient Position: Sitting, Cuff Size: Large)   Pulse (!) 56   Ht 5\' 7"  (1.702 m)   Wt 218 lb 9.6 oz (99.2 kg)   SpO2 98%   BMI 34.24 kg/m     Wt Readings from Last 3 Encounters:  11/19/22 218 lb 9.6 oz (99.2 kg)  09/14/22 214 lb 6 oz (97.2 kg)  08/23/22 215 lb 8 oz (97.8 kg)     GEN:  Well nourished, well developed in no acute distress HEENT: Normal NECK: No JVD; No carotid bruits CARDIAC: RRR, no murmurs, rubs, gallops RESPIRATORY:  Clear to auscultation without rales, wheezing or rhonchi  ABDOMEN: Soft, non-tender, non-distended MUSCULOSKELETAL:  No edema; No deformity  SKIN: Warm and dry NEUROLOGIC:  Alert and oriented x 3 PSYCHIATRIC:  Normal affect   ASSESSMENT:    1. Primary hypertension    PLAN:    In order of problems listed above:  Hypertension, BP elevated today, .  Increase benazepril to 40 mg daily, continue HCTZ 50 mg daily.  Low-salt diet advised, check BP at home and keep log.  Amlodipine previously stopped due to patient concerns regarding hair loss. .  ACE inhibitor has also been described to cause some hair loss.  Follow-up in 4 months.     Medication  Adjustments/Labs and Tests Ordered: Current medicines are reviewed at length with the patient today.  Concerns regarding medicines are outlined above.  Orders Placed This Encounter  Procedures   EKG 12-Lead   Meds ordered this encounter  Medications   benazepril (LOTENSIN) 40 MG tablet    Sig: Take 1 tablet (40 mg total) by mouth daily.    Dispense:  90 tablet    Refill:  0    Patient Instructions  Medication Instructions:   INCREASE benazepril (LOTENSIN) - Take one tablet ( 40mg ) by mouth daily.   *If you need a refill on your cardiac medications before your next appointment, please call your pharmacy*   Lab Work:  None Ordered  If you have labs (blood work) drawn today and your tests are completely normal, you will receive your results only by: MyChart Message (if you have MyChart) OR A paper copy in the mail If you have any lab test that is abnormal or we need to change your treatment, we will call you to review the results.   Testing/Procedures:  None Ordered   Follow-Up: At [facility], you and your health needs are our priority.  As part of our continuing mission to provide you with exceptional heart care, we have created designated Provider Care Teams.  These Care Teams include your primary Cardiologist (physician) and Advanced Practice Providers (APPs -  Physician Assistants and Nurse Practitioners) who all work together to provide you with the care you need, when you need it.  We recommend signing up for the patient portal called "MyChart".  Sign up information is provided on this After Visit Summary.  MyChart is used to connect with patients for Virtual Visits (Telemedicine).  Patients are able to view lab/test results, encounter notes, upcoming appointments, etc.  Non-urgent messages can be sent to your provider as well.   To learn more about what you can do with MyChart, go to [email or web address].    Your next appointment:   4  month(s)  Provider:   You may see Debbe Odea, MD or one of the following Advanced  Practice Providers on your designated Care Team:   Nicolasa Ducking, NP Eula Listen, PA-C Cadence Fransico Michael, PA-C Charlsie Quest, NP Carlos Levering, NP   Signed, Debbe Odea, MD  11/19/2022 10:20 AM    [facility]

## 2022-11-19 NOTE — Patient Instructions (Signed)
Medication Instructions:   INCREASE benazepril (LOTENSIN) - Take one tablet ( 40mg ) by mouth daily.   *If you need a refill on your cardiac medications before your next appointment, please call your pharmacy*   Lab Work:  None Ordered  If you have labs (blood work) drawn today and your tests are completely normal, you will receive your results only by: MyChart Message (if you have MyChart) OR A paper copy in the mail If you have any lab test that is abnormal or we need to change your treatment, we will call you to review the results.   Testing/Procedures:  None Ordered   Follow-Up: At [facility], you and your health needs are our priority.  As part of our continuing mission to provide you with exceptional heart care, we have created designated Provider Care Teams.  These Care Teams include your primary Cardiologist (physician) and Advanced Practice Providers (APPs -  Physician Assistants and Nurse Practitioners) who all work together to provide you with the care you need, when you need it.  We recommend signing up for the patient portal called "MyChart".  Sign up information is provided on this After Visit Summary.  MyChart is used to connect with patients for Virtual Visits (Telemedicine).  Patients are able to view lab/test results, encounter notes, upcoming appointments, etc.  Non-urgent messages can be sent to your provider as well.   To learn more about what you can do with MyChart, go to [email or web address].    Your next appointment:   4 month(s)  Provider:   You may see Debbe Odea, MD or one of the following Advanced Practice Providers on your designated Care Team:   Nicolasa Ducking, NP Eula Listen, PA-C Cadence Fransico Michael, PA-C Charlsie Quest, NP Carlos Levering, NP

## 2022-12-27 ENCOUNTER — Telehealth: Payer: Medicare HMO | Admitting: Family Medicine

## 2022-12-27 ENCOUNTER — Encounter: Payer: Self-pay | Admitting: Family Medicine

## 2022-12-27 VITALS — BP 137/74 | HR 66 | Ht 67.0 in | Wt 215.0 lb

## 2022-12-27 DIAGNOSIS — K529 Noninfective gastroenteritis and colitis, unspecified: Secondary | ICD-10-CM | POA: Diagnosis not present

## 2022-12-27 NOTE — Assessment & Plan Note (Signed)
Suspect viral  Exposed to family members with same  N/v are resolved/still has watery diarrhea Encouraged to hold immodium Discussed strategy for rehydration and signs and symptoms of dehydration to watch for  Fluids in sips Followed by BRAT diet when ready  Watch for abd pain/fever Consider testing if symptoms exceed a week or worsen Update if not starting to improve in a week or if worsening  Call back and Er precautions noted in detail today   Handout given

## 2022-12-27 NOTE — Progress Notes (Signed)
Virtual Visit via Video Note  I connected with Rebecca [name] on 12/27/22 at 12:30 PM EST by a video enabled telemedicine application and verified that I am speaking with the correct person using two identifiers.  Patient Location: Home Provider location -office   I discussed the limitations, risks, security, and privacy concerns of performing an evaluation and management service by video and the availability of in person appointments. I also discussed with the patient that there may be a patient responsible charge related to this service. The patient expressed understanding and agreed to proceed.  Parties involved in encounter  Patient: Rebecca [name]  Provider:  Roxy Manns MD   Subjective: PCP: Judy Pimple, MD  Chief Complaint  Patient presents with   Diarrhea    Since Saturday, ? If Food Poisoning    HPI  Wt Readings from Last 3 Encounters:  12/27/22 215 lb (97.5 kg)  11/19/22 218 lb 9.6 oz (99.2 kg)  09/14/22 214 lb 6 oz (97.2 kg)   33.67 kg/m   Vitals:   12/27/22 1231  BP: 137/74  Pulse: 66   Started Saturday evening  Grandkids and son had GI virus   Had loose bm sat with some cramping   Ate some prime rib fri and Saturday   Loose stool/ now watery and yellowish in color  Now more solid than it was  No blood at all   N/v Saturday three times -none since  Nausea is good   Has been able to drink water and that helps   No fever  Abd is gassy and bloated but not pain  Not very crampy     Taking immodium  Did not work very well     ROS: Per HPI Review of Systems  Constitutional:  Positive for malaise/fatigue. Negative for chills, diaphoresis and fever.  HENT:  Negative for sore throat.   Respiratory:  Negative for cough and shortness of breath.   Cardiovascular:  Negative for chest pain.  Gastrointestinal:  Positive for diarrhea. Negative for blood in stool, constipation, heartburn, nausea and vomiting.       No abd pain     Neurological:  Negative for dizziness and headaches.     Current Outpatient Medications:    benazepril (LOTENSIN) 40 MG tablet, Take 1 tablet (40 mg total) by mouth daily., Disp: 90 tablet, Rfl: 0   cholecalciferol (VITAMIN D3) 25 MCG (1000 UNIT) tablet, Take 1,000 Units by mouth daily., Disp: , Rfl:    Cyanocobalamin (B-12) 5000 MCG CAPS, Take 1 capsule by mouth every other day., Disp: , Rfl:    diclofenac (VOLTAREN) 75 MG EC tablet, Take 1 tablet (75 mg total) by mouth 2 (two) times daily. Bid prn pain, Disp: 60 tablet, Rfl: 2   fluticasone (FLONASE) 50 MCG/ACT nasal spray, Place 2 sprays into both nostrils daily as needed for allergies or rhinitis., Disp: 48 g, Rfl: 3   hydrochlorothiazide (HYDRODIURIL) 50 MG tablet, Take 1 tablet (50 mg total) by mouth daily., Disp: 90 tablet, Rfl: 3   hydrocortisone (ANUCORT-HC) 25 MG suppository, UNWRAP AND INSERT ONE SUPPOSITORY RECTALLY EVERY NIGHT AT BEDTIME AS NEEDED FOR HEMORRHOIDS, Disp: 12 suppository, Rfl: 1   hydrocortisone (ANUSOL-HC) 2.5 % rectal cream, Apply to affected area on hemorrhoids once daily as needed, Disp: 30 g, Rfl: 0   latanoprost (XALATAN) 0.005 % ophthalmic solution, Place 1 drop into both eyes. , Disp: , Rfl:    lidocaine (XYLOCAINE) 2 % jelly, Apply 1 Application topically 3 (  three) times daily as needed (rectum)., Disp: 30 mL, Rfl: 1   magnesium oxide (MAG-OX) 400 (240 Mg) MG tablet, Take 400 mg by mouth daily., Disp: , Rfl:    Multiple Vitamin (MULTIVITAMIN) tablet, Take 1 tablet by mouth daily.  , Disp: , Rfl:    Omega-3 Fatty Acids (FISH OIL CONCENTRATE PO), Take 1 capsule by mouth daily. , Disp: , Rfl:    potassium chloride (KLOR-CON) 10 MEQ tablet, TAKE 1 TABLET BY MOUTH EVERY DAY, Disp: 90 tablet, Rfl: 2  Observations/Objective: Today's Vitals   12/27/22 1231  BP: 137/74  Pulse: 66  Weight: 215 lb (97.5 kg)  Height: 5\' 7"  (1.702 m)   Physical Exam Patient appears well, in no distress Weight is baseline  No  facial swelling or asymmetry Normal voice-not hoarse and no slurred speech No obvious tremor or mobility impairment Moving neck and UEs normally Able to hear the call well  No cough or shortness of breath during interview  Pt is able to palpate her abdomen without tenderness Talkative and mentally sharp with no cognitive changes No skin changes on face or neck , no rash or pallor Affect is normal   Assessment and Plan: There are no diagnoses linked to this encounter. Problem List Items Addressed This Visit       Digestive   Gastroenteritis - Primary   Suspect viral  Exposed to family members with same  N/v are resolved/still has watery diarrhea Encouraged to hold immodium Discussed strategy for rehydration and signs and symptoms of dehydration to watch for  Fluids in sips Followed by BRAT diet when ready  Watch for abd pain/fever Consider testing if symptoms exceed a week or worsen Update if not starting to improve in a week or if worsening  Call back and Er precautions noted in detail today   Handout given        Follow Up Instructions: Drink sips of clear fluid If doing well-can try small amounts of banana, rice , apple sauce or toast /crackers Take it very slow Update if not starting to improve in a week or if worsening   Watch for abdominal pain, fever, return of vomiting or blood in stool  If any severe symptoms go to the ER   I discussed the assessment and treatment plan with the patient. The patient was provided an opportunity to ask questions, and all were answered. The patient agreed with the plan and demonstrated an understanding of the instructions.   The patient was advised to call back or seek an in-person evaluation if the symptoms worsen or if the condition fails to improve as anticipated.  The above assessment and management plan was discussed with the patient. The patient verbalized understanding of and has agreed to the management plan.   Roxy Manns, MD

## 2022-12-27 NOTE — Patient Instructions (Signed)
Drink sips of clear fluid If doing well-can try small amounts of banana, rice , apple sauce or toast /crackers Take it very slow Update if not starting to improve in a week or if worsening   Watch for abdominal pain, fever, return of vomiting or blood in stool  If any severe symptoms go to the ER

## 2023-01-31 ENCOUNTER — Encounter: Payer: Self-pay | Admitting: Gastroenterology

## 2023-01-31 ENCOUNTER — Ambulatory Visit (INDEPENDENT_AMBULATORY_CARE_PROVIDER_SITE_OTHER): Payer: Medicare Other | Admitting: Gastroenterology

## 2023-01-31 VITALS — BP 122/64 | HR 74 | Ht 67.0 in | Wt 225.2 lb

## 2023-01-31 DIAGNOSIS — K644 Residual hemorrhoidal skin tags: Secondary | ICD-10-CM | POA: Diagnosis not present

## 2023-01-31 NOTE — Progress Notes (Signed)
01/31/2023 Rebecca [name] [id number] March 14, 1955   HISTORY OF PRESENT ILLNESS: This is a 68 year old female who is a patient of Dr. Lauro Franklin.  Was seen back in June 2024 for for perianal discomfort.  Diagnosed with a small external hemorrhoid, no fissure seen.  Colonoscopy July 2023 also noted small external hemorrhoids.  She was treated with hydrocortisone cream, given samples of RectiCare.  She is here today for follow-up.  She says that it does feel better than what it did in the past.  Still with some slight burning after bowel movements.  No rectal bleeding.   Past Medical History:  Diagnosis Date   Allergy    Cataract    beginnings of    Glaucoma    Heart murmur    noted on PCP exam 11/23/2012; "does not appear urgent"   Hemorrhoids    Hypertension    under control with meds., has been on med. > 20 yr.   Radius fracture 11/22/2012   left, comminuted   Past Surgical History:  Procedure Laterality Date   ABDOMINAL HYSTERECTOMY  06/02/2000   partial   COLONOSCOPY  03/2006   COLONOSCOPY WITH PROPOFOL  11/15/2011   ENDOMETRIAL ABLATION     OPEN REDUCTION INTERNAL FIXATION (ORIF) DISTAL RADIAL FRACTURE Left 11/29/2012   Procedure: OPEN REDUCTION INTERNAL FIXATION (ORIF) LEFT  DISTAL RADIAL FRACTURE;  Surgeon: Nicki Reaper, MD;  Location: [facility];  Service: Orthopedics;  Laterality: Left;   PARTIAL HYSTERECTOMY      reports that she has never smoked. She has never used smokeless tobacco. She reports that she does not drink alcohol and does not use drugs. family history includes Breast cancer in her maternal aunt; Colon cancer in her maternal uncle, mother, and paternal uncle; Colon polyps in her sister; Coronary artery disease in her father; Dementia in her father and mother; Diabetes in her maternal aunt and mother; Heart attack in her mother; Heart disease in her father and mother; Hypertension in her father and mother. Allergies  Allergen Reactions    Amoxicillin Hives and Swelling   Spironolactone Diarrhea   Augmentin [Amoxicillin-Pot Clavulanate] Diarrhea    Tore stomach up   Shrimp [Shellfish Allergy] Hives and Swelling      Outpatient Encounter Medications as of 01/31/2023  Medication Sig   benazepril (LOTENSIN) 40 MG tablet Take 1 tablet (40 mg total) by mouth daily.   cholecalciferol (VITAMIN D3) 25 MCG (1000 UNIT) tablet Take 1,000 Units by mouth daily.   diclofenac (VOLTAREN) 75 MG EC tablet Take 1 tablet (75 mg total) by mouth 2 (two) times daily. Bid prn pain   fluticasone (FLONASE) 50 MCG/ACT nasal spray Place 2 sprays into both nostrils daily as needed for allergies or rhinitis.   hydrochlorothiazide (HYDRODIURIL) 50 MG tablet Take 1 tablet (50 mg total) by mouth daily.   hydrocortisone (ANUCORT-HC) 25 MG suppository UNWRAP AND INSERT ONE SUPPOSITORY RECTALLY EVERY NIGHT AT BEDTIME AS NEEDED FOR HEMORRHOIDS   hydrocortisone (ANUSOL-HC) 2.5 % rectal cream Apply to affected area on hemorrhoids once daily as needed   latanoprost (XALATAN) 0.005 % ophthalmic solution Place 1 drop into both eyes.    lidocaine (XYLOCAINE) 2 % jelly Apply 1 Application topically 3 (three) times daily as needed (rectum).   magnesium oxide (MAG-OX) 400 (240 Mg) MG tablet Take 400 mg by mouth daily.   Multiple Vitamin (MULTIVITAMIN) tablet Take 1 tablet by mouth daily.     Omega-3 Fatty Acids (FISH OIL CONCENTRATE PO) Take  1 capsule by mouth daily.    potassium chloride (KLOR-CON) 10 MEQ tablet TAKE 1 TABLET BY MOUTH EVERY DAY   [DISCONTINUED] Cyanocobalamin (B-12) 5000 MCG CAPS Take 1 capsule by mouth every other day.   No facility-administered encounter medications on file as of 01/31/2023.     REVIEW OF SYSTEMS  : All other systems reviewed and negative except where noted in the History of Present Illness.   PHYSICAL EXAM: BP 122/64 (BP Location: Left Arm, Patient Position: Sitting, Cuff Size: Large)   Pulse 74   Ht 5\' 7"  (1.702 m)   Wt 225  lb 4 oz (102.2 kg)   SpO2 95%   BMI 35.28 kg/m  General: Well developed white female in no acute distress  ASSESSMENT AND PLAN: *External hemorrhoid: Improved from when she was seen here in June.  At that time small external hemorrhoid was noted, no fissure, etc.  Informed her that the hemorrhoid is not going to go away, can continuously become irritated, etc.  Continue the hydrocortisone as needed.  Can use Desitin/zinc oxide to the perianal area at bedtime as well.  I also gave her some Calmol suppositories to try.  Advised to make sure that the area is clean, but avoid over aggressive hygiene.  Reassurance given.   CC:  Tower, Audrie Gallus, MD

## 2023-01-31 NOTE — Patient Instructions (Signed)
Use Desitin/Zinc Oxide to the perineal area at bedtime as needed.   Use Calmol suppositories as needed.  _______________________________________________________  If your blood pressure at your visit was 140/90 or greater, please contact your primary care physician to follow up on this.  _______________________________________________________  If you are age 70 or older, your body mass index should be between 23-30. Your Body mass index is 35.28 kg/m. If this is out of the aforementioned range listed, please consider follow up with your Primary Care Provider.  If you are age 50 or younger, your body mass index should be between 19-25. Your Body mass index is 35.28 kg/m. If this is out of the aformentioned range listed, please consider follow up with your Primary Care Provider.   ________________________________________________________  The [facility] providers would like to encourage you to use [facility] to communicate with providers for non-urgent requests or questions.  Due to long hold times on the telephone, sending your provider a message by [facility] may be a faster and more efficient way to get a response.  Please allow 48 business hours for a response.  Please remember that this is for non-urgent requests.  _______________________________________________________

## 2023-02-10 ENCOUNTER — Ambulatory Visit
Admission: EM | Admit: 2023-02-10 | Discharge: 2023-02-10 | Disposition: A | Discharge status: Home / Self Care | Payer: Medicare Other | Attending: Emergency Medicine | Admitting: Emergency Medicine

## 2023-02-10 ENCOUNTER — Encounter: Payer: Self-pay | Admitting: Emergency Medicine

## 2023-02-10 DIAGNOSIS — J101 Influenza due to other identified influenza virus with other respiratory manifestations: Secondary | ICD-10-CM

## 2023-02-10 DIAGNOSIS — R509 Fever, unspecified: Secondary | ICD-10-CM | POA: Diagnosis not present

## 2023-02-10 LAB — POC COVID19/FLU A&B COMBO
Covid Antigen, POC: NEGATIVE
Influenza A Antigen, POC: POSITIVE — AB
Influenza B Antigen, POC: NEGATIVE

## 2023-02-10 MED ORDER — OSELTAMIVIR PHOSPHATE 75 MG PO CAPS: 75.0000 mg | ORAL_CAPSULE | Freq: Two times a day (BID) | ORAL | 0 refills | Status: DC

## 2023-02-10 NOTE — ED Triage Notes (Signed)
 Pt presents with cough, headache, chills and bodyaches x 2 days.

## 2023-02-10 NOTE — Discharge Instructions (Signed)
They are most likely being caused by influenza A, your symptoms are consistent with the current presentation, influenza is a virus and will steadily improve with time  You may take Tamiflu twice daily for the next 5 days, if you are able to get this medication from the pharmacy then continue supportive treatment, symptoms will improve as the virus works as well after system whether or not you take this medication    You can take Tylenol and/or Ibuprofen as needed for fever reduction and pain relief.   For cough: honey 1/2 to 1 teaspoon (you can dilute the honey in water or another fluid).  You can also use guaifenesin and dextromethorphan for cough. You can use a humidifier for chest congestion and cough.  If you don't have a humidifier, you can sit in the bathroom with the hot shower running.      For sore throat: try warm salt water gargles, cepacol lozenges, throat spray, warm tea or water with lemon/honey, popsicles or ice, or OTC cold relief medicine for throat discomfort.   For congestion: take a daily anti-histamine like Zyrtec, Claritin, and a oral decongestant, such as pseudoephedrine.  You can also use Flonase 1-2 sprays in each nostril daily.   It is important to stay hydrated: drink plenty of fluids (water, gatorade/powerade/pedialyte, juices, or teas) to keep your throat moisturized and help further relieve irritation/discomfort.    

## 2023-02-10 NOTE — ED Provider Notes (Signed)
 [facility]    CSN: [id number] Arrival date & time: 02/10/23  1624      History   Chief Complaint Chief Complaint  Patient presents with   Cough   Chills   Generalized Body Aches   Nasal Congestion    HPI Rebecca [name] is a 68 y.o. female.   Patient presents for evaluation of a scratchy throat present for 2 days, began to experience intermittent headaches, nasal congestion, nonproductive cough and chills beginning 1 day ago.  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  Poor appetite but tolerating some foods and fluids.  Has attempted use of Mucinex  DM.  Denies fever, shortness of breath or wheezing.  Past Medical History:  Diagnosis Date   Allergy    Cataract    beginnings of    Glaucoma    Heart murmur    noted on PCP exam 11/23/2012; does not appear urgent   Hemorrhoids    Hypertension    under control with meds., has been on med. > 20 yr.   Radius fracture 11/22/2012   left, comminuted    Patient Active Problem List   Diagnosis Date Noted   Gastroenteritis 12/27/2022   Leukopenia 09/14/2022   Hair loss 08/23/2022   Class 2 severe obesity due to excess calories with serious comorbidity and body mass index (BMI) of 35.0 to 35.9 in adult [facility]) 05/08/2022   Atypical chest pain 05/06/2022   Hepatic steatosis 01/05/2022   Crepitus of cervical spine 04/30/2021   Unilateral occipital headache 04/30/2021   Medicare annual wellness visit, subsequent 11/24/2020   Low back pain 10/14/2020   Breast pain 07/22/2020   Insomnia 07/23/2019   Heart rate problem 05/29/2019   Estrogen deficiency 11/27/2018   Encounter for pre-employment examination 11/02/2016   Fungal nail infection 04/27/2016   Sinusitis, chronic 03/12/2015   Fatigue 05/24/2014   Hyperlipidemia 10/30/2013   History of radius fracture 11/23/2012   Undiagnosed cardiac murmurs 11/23/2012   Family history of colon cancer requiring screening colonoscopy 09/14/2011   Routine general medical  examination at a health care facility 09/05/2011   Mixed incontinence urge and stress 04/14/2011   Hemorrhoids, internal, with bleeding 10/12/2010   External hemorrhoids 11/04/2009   Allergic rhinitis 08/13/2009   Hypokalemia 04/28/2007   Prediabetes 11/15/2006   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   BUNIONS, RIGHT FOOT 07/19/2006   Herpes simplex virus (HSV) infection 07/06/2006   Essential hypertension 07/06/2006   URINARY INCONTINENCE, MIXED 07/06/2006    Past Surgical History:  Procedure Laterality Date   ABDOMINAL HYSTERECTOMY  06/02/2000   partial   COLONOSCOPY  03/2006   COLONOSCOPY WITH PROPOFOL   11/15/2011   ENDOMETRIAL ABLATION     OPEN REDUCTION INTERNAL FIXATION (ORIF) DISTAL RADIAL FRACTURE Left 11/29/2012   Procedure: OPEN REDUCTION INTERNAL FIXATION (ORIF) LEFT  DISTAL RADIAL FRACTURE;  Surgeon: Arley JONELLE Curia, MD;  Location: [facility];  Service: Orthopedics;  Laterality: Left;   PARTIAL HYSTERECTOMY      OB History   No obstetric history on file.      Home Medications    Prior to Admission medications   Medication Sig Start Date End Date Taking? Authorizing Provider  oseltamivir  (TAMIFLU ) 75 MG capsule Take 1 capsule (75 mg total) by mouth every 12 (twelve) hours. 02/10/23  Yes Mary-Anne Polizzi R, NP  benazepril  (LOTENSIN ) 40 MG tablet Take 1 tablet (40 mg total) by mouth daily. 11/19/22   Darliss Rogue, MD  cholecalciferol (VITAMIN D3) 25 MCG (  1000 UNIT) tablet Take 1,000 Units by mouth daily.    [provider]  diclofenac  (VOLTAREN ) 75 MG EC tablet Take 1 tablet (75 mg total) by mouth 2 (two) times daily. Bid prn pain 09/01/21   Jule Ronal CROME, PA-C  fluticasone  (FLONASE ) 50 MCG/ACT nasal spray Place 2 sprays into both nostrils daily as needed for allergies or rhinitis. 11/25/17   Tower, Laine LABOR, MD  hydrochlorothiazide  (HYDRODIURIL ) 50 MG tablet Take 1 tablet (50 mg total) by mouth daily. 09/14/22   Tower, Laine LABOR, MD  hydrocortisone   (ANUCORT-HC ) 25 MG suppository UNWRAP AND INSERT ONE SUPPOSITORY RECTALLY EVERY NIGHT AT BEDTIME AS NEEDED FOR HEMORRHOIDS 06/08/22   Collier, Amanda R, PA-C  hydrocortisone  (ANUSOL -HC) 2.5 % rectal cream Apply to affected area on hemorrhoids once daily as needed 10/10/15   Clark, Katherine K, NP  latanoprost (XALATAN) 0.005 % ophthalmic solution Place 1 drop into both eyes.  06/27/13   [provider]  lidocaine  (XYLOCAINE ) 2 % jelly Apply 1 Application topically 3 (three) times daily as needed (rectum). 06/08/22   Craig Alan SAUNDERS, PA-C  magnesium oxide (MAG-OX) 400 (240 Mg) MG tablet Take 400 mg by mouth daily.    [provider]  Multiple Vitamin (MULTIVITAMIN) tablet Take 1 tablet by mouth daily.      [provider]  Omega-3 Fatty Acids (FISH OIL CONCENTRATE PO) Take 1 capsule by mouth daily.     [provider]  potassium chloride  (KLOR-CON ) 10 MEQ tablet TAKE 1 TABLET BY MOUTH EVERY DAY 02/23/21   Tower, Laine LABOR, MD    Family History Family History  Problem Relation Age of Onset   Heart attack Mother    Heart disease Mother    Hypertension Mother    Colon cancer Mother    Diabetes Mother    Dementia Mother    Heart disease Father    Hypertension Father    Coronary artery disease Father        with CABG   Dementia Father    Colon polyps Sister    Breast cancer Maternal Aunt    Diabetes Maternal Aunt    Colon cancer Maternal Uncle    Colon cancer Paternal Uncle    Esophageal cancer Neg Hx    Rectal cancer Neg Hx    Stomach cancer Neg Hx     Social History Social History   Tobacco Use   Smoking status: Never   Smokeless tobacco: Never  Vaping Use   Vaping status: Never Used  Substance Use Topics   Alcohol use: No   Drug use: No     Allergies   Amoxicillin , Spironolactone, Augmentin  [amoxicillin -pot clavulanate], and Shrimp [shellfish allergy]   Review of Systems Review of Systems  Respiratory:  Positive for cough.       Physical Exam Triage Vital Signs ED Triage Vitals  Encounter Vitals Group     BP 02/10/23 1701 129/75     Systolic BP Percentile --      Diastolic BP Percentile --      Pulse Rate 02/10/23 1701 82     Resp 02/10/23 1701 18     Temp 02/10/23 1701 (!) 102.2 F (39 C)     Temp Source 02/10/23 1701 Oral     SpO2 02/10/23 1701 95 %     Weight --      Height --      Head Circumference --      Peak Flow --  Pain Score 02/10/23 1700 4     Pain Loc --      Pain Education --      Exclude from Growth Chart --    No data found.  Updated Vital Signs BP 129/75 (BP Location: Left Arm)   Pulse 82   Temp (!) 102.2 F (39 C) (Oral)   Resp 18   SpO2 95%   Visual Acuity Right Eye Distance:   Left Eye Distance:   Bilateral Distance:    Right Eye Near:   Left Eye Near:    Bilateral Near:     Physical Exam Constitutional:      Appearance: Normal appearance.  HENT:     Head: Normocephalic.     Right Ear: Tympanic membrane, ear canal and external ear normal.     Left Ear: Tympanic membrane, ear canal and external ear normal.     Nose: Congestion present. No rhinorrhea.     Mouth/Throat:     Mouth: Mucous membranes are moist.     Pharynx: Oropharynx is clear. No oropharyngeal exudate or posterior oropharyngeal erythema.  Eyes:     Extraocular Movements: Extraocular movements intact.  Cardiovascular:     Rate and Rhythm: Normal rate and regular rhythm.     Pulses: Normal pulses.     Heart sounds: Normal heart sounds.  Pulmonary:     Effort: Pulmonary effort is normal.     Breath sounds: Normal breath sounds.  Musculoskeletal:     Cervical back: Normal range of motion and neck supple.  Neurological:     Mental Status: She is alert and oriented to person, place, and time. Mental status is at baseline.      UC Treatments / Results  Labs (all labs ordered are listed, but only abnormal results are displayed) Labs Reviewed  POC COVID19/FLU A&B COMBO - Abnormal;  Notable for the following components:      Result Value   Influenza A Antigen, POC Positive (*)    All other components within normal limits    EKG   Radiology No results found.  Procedures Procedures (including critical care time)  Medications Ordered in UC Medications - No data to display  Initial Impression / Assessment and Plan / UC Course  I have reviewed the triage vital signs and the nursing notes.  Pertinent labs & imaging results that were available during my care of the patient were reviewed by me and considered in my medical decision making (see chart for details).  Influenza A  Fever of 102.2 noted in triage, given Tylenol  no signs of sepsis, stable for outpatient management, low suspicion for pneumonia, pneumothorax or bronchitis and therefore will defer imaging.  Prescribed Tamiflu .May use additional over-the-counter medications as needed for supportive care.  May follow-up with urgent care as needed if symptoms persist or worsen.  Note given.   Final Clinical Impressions(s) / UC Diagnoses   Final diagnoses:  Influenza A     Discharge Instructions      They are most likely being caused by influenza A, your symptoms are consistent with the current presentation, influenza is a virus and will steadily improve with time  You may take Tamiflu  twice daily for the next 5 days, if you are able to get this medication from the pharmacy then continue supportive treatment, symptoms will improve as the virus works as well after system whether or not you take this medication    You can take Tylenol  and/or Ibuprofen as needed for fever reduction  and pain relief.   For cough: honey 1/2 to 1 teaspoon (you can dilute the honey in water or another fluid).  You can also use guaifenesin  and dextromethorphan for cough. You can use a humidifier for chest congestion and cough.  If you don't have a humidifier, you can sit in the bathroom with the hot shower running.      For sore  throat: try warm salt water gargles, cepacol lozenges, throat spray, warm tea or water with lemon/honey, popsicles or ice, or OTC cold relief medicine for throat discomfort.   For congestion: take a daily anti-histamine like Zyrtec, Claritin, and a oral decongestant, such as pseudoephedrine.  You can also use Flonase  1-2 sprays in each nostril daily.   It is important to stay hydrated: drink plenty of fluids (water, gatorade/powerade/pedialyte, juices, or teas) to keep your throat moisturized and help further relieve irritation/discomfort.      ED Prescriptions     Medication Sig Dispense Auth. Provider   oseltamivir  (TAMIFLU ) 75 MG capsule Take 1 capsule (75 mg total) by mouth every 12 (twelve) hours. 10 capsule Gerasimos Plotts R, NP      PDMP not reviewed this encounter.   Teresa Shelba SAUNDERS, NP 02/10/23 1754

## 2023-02-16 ENCOUNTER — Other Ambulatory Visit: Payer: Self-pay

## 2023-02-16 MED ORDER — BENAZEPRIL HCL 40 MG PO TABS: 40.0000 mg | ORAL_TABLET | Freq: Every day | ORAL | 0 refills | Status: DC

## 2023-02-24 ENCOUNTER — Emergency Department: Payer: Medicare Other

## 2023-02-24 ENCOUNTER — Other Ambulatory Visit: Payer: Self-pay

## 2023-02-24 ENCOUNTER — Emergency Department
Admission: EM | Admit: 2023-02-24 | Discharge: 2023-02-24 | Disposition: A | Discharge status: Home / Self Care | Payer: Medicare Other | Attending: Emergency Medicine | Admitting: Emergency Medicine

## 2023-02-24 ENCOUNTER — Encounter: Payer: Self-pay | Admitting: Emergency Medicine

## 2023-02-24 DIAGNOSIS — R062 Wheezing: Secondary | ICD-10-CM | POA: Diagnosis not present

## 2023-02-24 DIAGNOSIS — J069 Acute upper respiratory infection, unspecified: Secondary | ICD-10-CM | POA: Insufficient documentation

## 2023-02-24 DIAGNOSIS — R059 Cough, unspecified: Secondary | ICD-10-CM | POA: Diagnosis not present

## 2023-02-24 DIAGNOSIS — I10 Essential (primary) hypertension: Secondary | ICD-10-CM | POA: Diagnosis not present

## 2023-02-24 DIAGNOSIS — B9789 Other viral agents as the cause of diseases classified elsewhere: Secondary | ICD-10-CM | POA: Diagnosis not present

## 2023-02-24 MED ORDER — PREDNISONE 20 MG PO TABS: 40.0000 mg | ORAL_TABLET | Freq: Every day | ORAL | 0 refills | Status: AC

## 2023-02-24 MED ORDER — ALBUTEROL SULFATE HFA 108 (90 BASE) MCG/ACT IN AERS: 2.0000 | INHALATION_SPRAY | Freq: Four times a day (QID) | RESPIRATORY_TRACT | 1 refills | Status: DC | PRN

## 2023-02-24 MED ORDER — BENZONATATE 100 MG PO CAPS: ORAL_CAPSULE | ORAL | 0 refills | Status: DC

## 2023-02-24 NOTE — ED Triage Notes (Signed)
 Pt via POV from home. Pt c/o cough and wheezing for the past week, pt has already been dx with the flu. Denies hx of COPD/CHF. Denies fever. Pt is A&Ox4 and NAD

## 2023-02-24 NOTE — ED Provider Notes (Signed)
 [facility] Emergency Department Provider Note     Event Date/Time   First MD Initiated Contact with Patient 02/24/23 1142     (approximate)   History   Cough   HPI  Rebecca [name] is a 68 y.o. female with a history of of hypertension, glaucoma, heart murmur, presents to the ED with a diagnosis of influenza.  She presents with reports of ongoing cough and wheezing.  Patient denies any fevers, nausea, vomiting, or diarrhea.   Physical Exam   Triage Vital Signs: ED Triage Vitals  Encounter Vitals Group     BP 02/24/23 1032 (!) 164/94     Systolic BP Percentile --      Diastolic BP Percentile --      Pulse Rate 02/24/23 1032 76     Resp 02/24/23 1032 18     Temp 02/24/23 1032 97.8 F (36.6 C)     Temp Source 02/24/23 1032 Oral     SpO2 02/24/23 1032 97 %     Weight 02/24/23 1031 225 lb (102.1 kg)     Height 02/24/23 1031 5\' 7"  (1.702 m)     Head Circumference --      Peak Flow --      Pain Score 02/24/23 1031 0     Pain Loc --      Pain Education --      Exclude from Growth Chart --     Most recent vital signs: Vitals:   02/24/23 1032 02/24/23 1203  BP: (!) 164/104 (!) 158/88  Pulse: 75 70  Resp: 18 14  Temp: 97.8 F (36.6 C) 98 F (36.7 C)  SpO2: 100% 99%    General Awake, no distress. NAD HEENT NCAT. PERRL. EOMI. No rhinorrhea. Mucous membranes are moist.  CV:  Good peripheral perfusion. RRR RESP:  Normal effort. CTA ABD:  No distention.    ED Results / Procedures / Treatments   Labs (all labs ordered are listed, but only abnormal results are displayed) Labs Reviewed - No data to display   EKG   RADIOLOGY  I personally viewed and evaluated these images as part of my medical decision making, as well as reviewing the written report by the radiologist.  DG Chest 2 View Result Date: 02/24/2023 CLINICAL DATA:  Cough, wheezing. EXAM: CHEST - 2 VIEW COMPARISON:  February 04, 2020. FINDINGS: The heart size and mediastinal  contours are within normal limits. Both lungs are clear. The visualized skeletal structures are unremarkable. IMPRESSION: No active cardiopulmonary disease. Electronically Signed   By: Lupita Raider M.D.   On: 02/24/2023 11:13     PROCEDURES:  Critical Care performed: No  Procedures   MEDICATIONS ORDERED IN ED: Medications - No data to display   IMPRESSION / MDM / ASSESSMENT AND PLAN / ED COURSE  I reviewed the triage vital signs and the nursing notes.                              Differential diagnosis includes, but is not limited to, COVID, flu, RSV, viral URI, bronchitis, CAP  Patient's presentation is most consistent with acute complicated illness / injury requiring diagnostic workup.  Patient's diagnosis is consistent with viral URI with cough.  Presents with ongoing cough and wheeze with a recent diagnosis of influenza.  She is been taken OTC meds like multisymptom relief medication with limited benefit.  Neck x-ray is reassuring as it shows  no acute infectious process.  Patient is stable at this time with a respiratory distress.  Patient will be discharged home with prescriptions for albuterol inhaler, Tessalon Perles, and prednisone. Patient is to follow up with PCP as discussed, as needed or otherwise directed. Patient is given ED precautions to return to the ED for any worsening or new symptoms.   FINAL CLINICAL IMPRESSION(S) / ED DIAGNOSES   Final diagnoses:  Viral URI with cough     Rx / DC Orders   ED Discharge Orders          Ordered    benzonatate (TESSALON PERLES) 100 MG capsule        02/24/23 1155    albuterol (VENTOLIN HFA) 108 (90 Base) MCG/ACT inhaler  Every 6 hours PRN        02/24/23 1155    predniSONE (DELTASONE) 20 MG tablet  Daily with breakfast        02/24/23 1155             Note:  This document was prepared using Dragon voice recognition software and may include unintentional dictation errors.    Lissa Hoard,  PA-C 02/25/23 2122    Delton Prairie, MD 02/28/23 [phone]

## 2023-02-24 NOTE — Discharge Instructions (Addendum)
 Your exam and chest x-ray are negative for pneumonia. Take the prescription meds as directed. Take OTC Delsym, (dextromethorphan) for additional cough relief. See your Primary Care provider for ongoing symptoms.

## 2023-02-28 ENCOUNTER — Telehealth: Payer: Self-pay

## 2023-02-28 NOTE — Transitions of Care (Post Inpatient/ED Visit) (Signed)
 Unable to reach patient by phone and left v/m requesting call back at [phone].       02/28/2023  Name: Rebecca [name] MRN: [id number] DOB: 1955-01-14  Today's TOC FU Call Status: Today's TOC FU Call Status:: Unsuccessful Call (1st Attempt) Unsuccessful Call (1st Attempt) Date: 02/28/23  Attempted to reach the patient regarding the most recent Inpatient/ED visit.  Follow Up Plan: Additional outreach attempts will be made to reach the patient to complete the Transitions of Care (Post Inpatient/ED visit) call.   Signature Lewanda Rife, LPN

## 2023-03-04 ENCOUNTER — Ambulatory Visit: Payer: Medicare Other | Admitting: Family Medicine

## 2023-03-07 DIAGNOSIS — H401131 Primary open-angle glaucoma, bilateral, mild stage: Secondary | ICD-10-CM | POA: Diagnosis not present

## 2023-03-07 DIAGNOSIS — H25813 Combined forms of age-related cataract, bilateral: Secondary | ICD-10-CM | POA: Diagnosis not present

## 2023-03-09 ENCOUNTER — Ambulatory Visit: Admitting: Family Medicine

## 2023-03-09 ENCOUNTER — Encounter: Payer: Self-pay | Admitting: Family Medicine

## 2023-03-09 VITALS — BP 139/80 | HR 73 | Temp 97.8°F | Ht 67.0 in | Wt 224.4 lb

## 2023-03-09 DIAGNOSIS — H6121 Impacted cerumen, right ear: Secondary | ICD-10-CM | POA: Diagnosis not present

## 2023-03-09 DIAGNOSIS — E876 Hypokalemia: Secondary | ICD-10-CM | POA: Diagnosis not present

## 2023-03-09 DIAGNOSIS — L659 Nonscarring hair loss, unspecified: Secondary | ICD-10-CM | POA: Diagnosis not present

## 2023-03-09 DIAGNOSIS — I10 Essential (primary) hypertension: Secondary | ICD-10-CM | POA: Diagnosis not present

## 2023-03-09 DIAGNOSIS — E66812 Obesity, class 2: Secondary | ICD-10-CM

## 2023-03-09 DIAGNOSIS — Z23 Encounter for immunization: Secondary | ICD-10-CM

## 2023-03-09 DIAGNOSIS — H6120 Impacted cerumen, unspecified ear: Secondary | ICD-10-CM | POA: Insufficient documentation

## 2023-03-09 DIAGNOSIS — Z6835 Body mass index (BMI) 35.0-35.9, adult: Secondary | ICD-10-CM

## 2023-03-09 MED ORDER — POTASSIUM CHLORIDE ER 10 MEQ PO TBCR: 10.0000 meq | EXTENDED_RELEASE_TABLET | Freq: Every day | ORAL | 1 refills | Status: DC

## 2023-03-09 MED ORDER — AMLODIPINE BESY-BENAZEPRIL HCL 5-20 MG PO CAPS: 1.0000 | ORAL_CAPSULE | Freq: Every day | ORAL | 1 refills | Status: DC

## 2023-03-09 NOTE — Patient Instructions (Addendum)
 Continue current medicines   Follow up with cardiology as planned  Try to avoid excess sodium/processed foods in diet   I sent the amlodipine-benazepril to the pharmacy along with the potassium   Take a look at the hand out   Flu shot today   Ear looks much better after irrigation  If hearing does not continue to improve let us know

## 2023-03-09 NOTE — Assessment & Plan Note (Addendum)
 Overall worsened  Reviewed last cardiology notes in detail  She changed from lotrel to higher dose benazepril but blood pressure went up   On her own, pt returned to the amlodipine-benazepril 5-20  Says she can liver with hair loss Blood pressure is fair today  BP: 139/80  Encouraged when able to work on low sodium diet /weight loss/exercise   Will follow up with cardiology as planned

## 2023-03-09 NOTE — Assessment & Plan Note (Addendum)
 Left side Causing reduced hearing and discomfort  After consent obtained- simple ear irrigation performed with total resolution of the impaction  Relief noted/ hearing restored   Instructed to call if any ear pain or no further improvement Handout given on ear wax build up

## 2023-03-09 NOTE — Assessment & Plan Note (Signed)
 Lab Results  Component Value Date   K 3.8 09/14/2022   Will refill klor con 10 meq daily   Takes hydrochlorothiazide 50 mg-this is likely the cause

## 2023-03-09 NOTE — Addendum Note (Signed)
 Addended by: Shon Millet on: 03/09/2023 03:14 PM   Modules accepted: Orders

## 2023-03-09 NOTE — Assessment & Plan Note (Signed)
 Pt still thinks amlodipine causes hair loss but returned to it when blood pressure was too high  Says she can live with that  Taking amlodipine -benazepril 5-20 mg daily

## 2023-03-09 NOTE — Assessment & Plan Note (Signed)
 Discussed how this problem influences overall health and the risks it imposes  Reviewed plan for weight loss with lower calorie diet (via better food choices (lower glycemic and portion control) along with exercise building up to or more than 30 minutes 5 days per week including some aerobic activity and strength training   Given copy of Mediterranean diet as well

## 2023-03-09 NOTE — Progress Notes (Signed)
 Subjective:    Patient ID: Rebecca [name], female    DOB: [birth year], 68 y.o.   MRN: [id number]  HPI  Wt Readings from Last 3 Encounters:  03/09/23 224 lb 6 oz (101.8 kg)  02/24/23 225 lb (102.1 kg)  01/31/23 225 lb 4 oz (102.2 kg)   35.14 kg/m  Vitals:   03/09/23 1414 03/09/23 1443  BP: (!) 146/82 139/80  Pulse: 73   Temp: 97.8 F (36.6 C)   SpO2: 96%    Pt presents for ear discomfort  Also HTN   Flu shot  Inquires about pna vaccine   Has history of chronic sinusitis in past  Also dev septum  Had the flu in feb along with prolonged cough/seen in ER Prescription tessalon and albuterol and prednisone   DG Chest 2 View Result Date: 02/24/2023 CLINICAL DATA:  Cough, wheezing. EXAM: CHEST - 2 VIEW COMPARISON:  February 04, 2020. FINDINGS: The heart size and mediastinal contours are within normal limits. Both lungs are clear. The visualized skeletal structures are unremarkable. IMPRESSION: No active cardiopulmonary disease. Electronically Signed   By: Lupita Raider M.D.   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  Increase benazepril to 40 mg daily, continue HCTZ 50 mg daily.  Low-salt diet advised, check BP at home and keep log.  Amlodipine previously  stopped due to patient concerns regarding hair loss. .  ACE inhibitor has also been described to cause some hair loss.   Follow-up in 4 months.  Lab Results  Component Value Date   NA 138 09/14/2022   K 3.8 09/14/2022   CO2 28 09/14/2022   GLUCOSE 91 09/14/2022   BUN 15 09/14/2022   CREATININE 0.78 09/14/2022   CALCIUM 9.6 09/14/2022   GFR 77.52 09/14/2022   EGFR 94.0 10/24/2021   GFRNONAA >60 08/03/2020       Patient Active Problem List   Diagnosis Date Noted   Cerumen impaction 03/09/2023   Gastroenteritis 12/27/2022   Leukopenia 09/14/2022   Hair loss 08/23/2022   Class 2 severe obesity due to excess calories with serious comorbidity and body mass index (BMI) of 35.0 to 35.9 in adult [facility]) 05/08/2022   Hepatic steatosis 01/05/2022   Crepitus of cervical spine 04/30/2021   Unilateral occipital headache 04/30/2021   Medicare annual wellness visit, subsequent 11/24/2020   Low back pain 10/14/2020   Breast pain 07/22/2020   Insomnia 07/23/2019   Heart rate problem 05/29/2019   Estrogen deficiency 11/27/2018   Encounter for pre-employment examination 11/02/2016   Fungal nail infection 04/27/2016   Sinusitis, chronic 03/12/2015   Fatigue 05/24/2014  Hyperlipidemia 10/30/2013   History of radius fracture 11/23/2012   Undiagnosed cardiac murmurs 11/23/2012   Family history of colon cancer requiring screening colonoscopy 09/14/2011   Routine general medical examination at a health care facility 09/05/2011   Mixed incontinence urge and stress 04/14/2011   Hemorrhoids, internal, with bleeding 10/12/2010   External hemorrhoids 11/04/2009   Allergic rhinitis 08/13/2009   Hypokalemia 04/28/2007   Prediabetes 11/15/2006   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   BUNIONS, RIGHT FOOT 07/19/2006   Herpes simplex virus (HSV) infection 07/06/2006   Essential hypertension 07/06/2006   URINARY INCONTINENCE, MIXED 07/06/2006   Past Medical History:  Diagnosis Date   Allergy     Cataract    beginnings of    Glaucoma    Heart murmur    noted on PCP exam 11/23/2012; "does not appear urgent"   Hemorrhoids    Hypertension    under control with meds., has been on med. > 20 yr.   Radius fracture 11/22/2012   left, comminuted   Past Surgical History:  Procedure Laterality Date   ABDOMINAL HYSTERECTOMY  06/02/2000   partial   COLONOSCOPY  03/2006   COLONOSCOPY WITH PROPOFOL  11/15/2011   ENDOMETRIAL ABLATION     OPEN REDUCTION INTERNAL FIXATION (ORIF) DISTAL RADIAL FRACTURE Left 11/29/2012   Procedure: OPEN REDUCTION INTERNAL FIXATION (ORIF) LEFT  DISTAL RADIAL FRACTURE;  Surgeon: Nicki Reaper, MD;  Location: [facility];  Service: Orthopedics;  Laterality: Left;   PARTIAL HYSTERECTOMY     Social History   Tobacco Use   Smoking status: Never   Smokeless tobacco: Never  Vaping Use   Vaping status: Never Used  Substance Use Topics   Alcohol use: No   Drug use: No   Family History  Problem Relation Age of Onset   Heart attack Mother    Heart disease Mother    Hypertension Mother    Colon cancer Mother    Diabetes Mother    Dementia Mother    Heart disease Father    Hypertension Father    Coronary artery disease Father        with CABG   Dementia Father    Colon polyps Sister    Breast cancer Maternal Aunt    Diabetes Maternal Aunt    Colon cancer Maternal Uncle    Colon cancer Paternal Uncle    Esophageal cancer Neg Hx    Rectal cancer Neg Hx    Stomach cancer Neg Hx    Allergies  Allergen Reactions   Amoxicillin Hives and Swelling   Spironolactone Diarrhea   Augmentin [Amoxicillin-Pot Clavulanate] Diarrhea    Tore stomach up   Current Outpatient Medications on File Prior to Visit  Medication Sig Dispense Refill   albuterol (VENTOLIN HFA) 108 (90 Base) MCG/ACT inhaler Inhale 2 puffs into the lungs every 6 (six) hours as needed for shortness of breath. 6.7 g 1   benzonatate (TESSALON PERLES) 100 MG capsule Take 1-2 tabs TID  prn cough 30 capsule 0   cholecalciferol (VITAMIN D3) 25 MCG (1000 UNIT) tablet Take 1,000 Units by mouth daily.     diclofenac (VOLTAREN) 75 MG EC tablet Take 1 tablet (75 mg total) by mouth 2 (two) times daily. Bid prn pain 60 tablet 2   fluticasone (FLONASE) 50 MCG/ACT nasal spray Place 2 sprays into both nostrils daily as needed for allergies or rhinitis. 48 g 3   hydrochlorothiazide (HYDRODIURIL) 50 MG tablet Take 1 tablet (50 mg total) by  mouth daily. 90 tablet 3   hydrocortisone (ANUCORT-HC) 25 MG suppository UNWRAP AND INSERT ONE SUPPOSITORY RECTALLY EVERY NIGHT AT BEDTIME AS NEEDED FOR HEMORRHOIDS 12 suppository 1   hydrocortisone (ANUSOL-HC) 2.5 % rectal cream Apply to affected area on hemorrhoids once daily as needed 30 g 0   latanoprost (XALATAN) 0.005 % ophthalmic solution Place 1 drop into both eyes.      lidocaine (XYLOCAINE) 2 % jelly Apply 1 Application topically 3 (three) times daily as needed (rectum). 30 mL 1   Multiple Vitamin (MULTIVITAMIN) tablet Take 1 tablet by mouth daily.       No current facility-administered medications on file prior to visit.    Review of Systems  Constitutional:  Positive for fatigue. Negative for activity change, appetite change, fever and unexpected weight change.  HENT:  Positive for ear pain and hearing loss. Negative for congestion, ear discharge, facial swelling, rhinorrhea, sinus pressure and sore throat.   Eyes:  Negative for pain, redness and visual disturbance.  Respiratory:  Positive for cough. Negative for shortness of breath and wheezing.        Cough is improved   Cardiovascular:  Negative for chest pain and palpitations.  Gastrointestinal:  Negative for abdominal pain, blood in stool, constipation and diarrhea.  Endocrine: Negative for polydipsia and polyuria.  Genitourinary:  Negative for dysuria, frequency and urgency.  Musculoskeletal:  Negative for arthralgias, back pain and myalgias.  Skin:  Negative for pallor and rash.        Has hair loss from blood pressure medicine   Allergic/Immunologic: Negative for environmental allergies.  Neurological:  Negative for dizziness, syncope and headaches.  Hematological:  Negative for adenopathy. Does not bruise/bleed easily.  Psychiatric/Behavioral:  Negative for decreased concentration and dysphoric mood. The patient is not nervous/anxious.        Objective:   Physical Exam Constitutional:      General: She is not in acute distress.    Appearance: Normal appearance. She is well-developed. She is obese. She is not ill-appearing or diaphoretic.  HENT:     Head: Normocephalic and atraumatic.     Right Ear: There is impacted cerumen.     Left Ear: Tympanic membrane, ear canal and external ear normal. There is no impacted cerumen.     Ears:     Comments: Impacted wet cerumen in right  ear   Scant cerumen left ear-normal appearing TM  Procedure: Cerumen Disimpaction After consent obtained  Warm water was applied and gentle ear lavage performed on the right  ear.    There were no complications and following the disimpaction the tympanic membrane were visible on the bilateral. Tympanic membranes are intact following the procedure.  Auditory canals are normal.  The patient reported relief of symptoms after removal of cerumen.   Eyes:     Conjunctiva/sclera: Conjunctivae normal.     Pupils: Pupils are equal, round, and reactive to light.  Neck:     Thyroid: No thyromegaly.     Vascular: No carotid bruit or JVD.  Cardiovascular:     Rate and Rhythm: Normal rate and regular rhythm.     Heart sounds: Normal heart sounds.     No gallop.  Pulmonary:     Effort: Pulmonary effort is normal. No respiratory distress.     Breath sounds: Normal breath sounds. No wheezing or rales.  Abdominal:     General: There is no distension or abdominal bruit.     Palpations: Abdomen is soft.  Musculoskeletal:  Cervical back: Normal range of motion and neck supple.     Right lower  leg: No edema.     Left lower leg: No edema.  Lymphadenopathy:     Cervical: No cervical adenopathy.  Skin:    General: Skin is warm and dry.     Coloration: Skin is not pale.     Findings: No bruising or rash.  Neurological:     Mental Status: She is alert.     Coordination: Coordination normal.     Deep Tendon Reflexes: Reflexes are normal and symmetric. Reflexes normal.  Psychiatric:        Mood and Affect: Mood normal.           Assessment & Plan:   Problem List Items Addressed This Visit       Cardiovascular and Mediastinum   Essential hypertension   Overall worsened  Reviewed last cardiology notes in detail  She changed from lotrel to higher dose benazepril but blood pressure went up   On her own, pt returned to the amlodipine-benazepril 5-20  Says she can liver with hair loss Blood pressure is fair today  BP: 139/80  Encouraged when able to work on low sodium diet /weight loss/exercise   Will follow up with cardiology as planned       Relevant Medications   amLODipine-benazepril (LOTREL) 5-20 MG capsule     Nervous and Auditory   Cerumen impaction - Primary   Left side Causing reduced hearing and discomfort  After consent obtained- simple ear irrigation performed with total resolution of the impaction  Relief noted/ hearing restored   Instructed to call if any ear pain or no further improvement Handout given on ear wax build up        Other   Hypokalemia   Lab Results  Component Value Date   K 3.8 09/14/2022   Will refill klor con 10 meq daily   Takes hydrochlorothiazide 50 mg-this is likely the cause       Relevant Medications   potassium chloride (KLOR-CON) 10 MEQ tablet   Hair loss   Pt still thinks amlodipine causes hair loss but returned to it when blood pressure was too high  Says she can live with that  Taking amlodipine -benazepril 5-20 mg daily       Class 2 severe obesity due to excess calories with serious comorbidity and  body mass index (BMI) of 35.0 to 35.9 in adult [facility])   Discussed how this problem influences overall health and the risks it imposes  Reviewed plan for weight loss with lower calorie diet (via better food choices (lower glycemic and portion control) along with exercise building up to or more than 30 minutes 5 days per week including some aerobic activity and strength training   Given copy of Mediterranean diet as well

## 2023-03-21 ENCOUNTER — Ambulatory Visit: Payer: Medicare HMO | Admitting: Cardiology

## 2023-04-18 ENCOUNTER — Ambulatory Visit: Admitting: Family Medicine

## 2023-04-22 DIAGNOSIS — E7800 Pure hypercholesterolemia, unspecified: Secondary | ICD-10-CM

## 2023-04-22 DIAGNOSIS — I10 Essential (primary) hypertension: Secondary | ICD-10-CM

## 2023-04-22 DIAGNOSIS — R7303 Prediabetes: Secondary | ICD-10-CM

## 2023-04-25 ENCOUNTER — Ambulatory Visit: Attending: Cardiology | Admitting: Cardiology

## 2023-04-25 ENCOUNTER — Encounter: Payer: Self-pay | Admitting: Cardiology

## 2023-04-25 VITALS — BP 130/76 | HR 69 | Ht 67.0 in | Wt 230.2 lb

## 2023-04-25 DIAGNOSIS — Z6836 Body mass index (BMI) 36.0-36.9, adult: Secondary | ICD-10-CM | POA: Diagnosis not present

## 2023-04-25 DIAGNOSIS — I10 Essential (primary) hypertension: Secondary | ICD-10-CM

## 2023-04-25 MED ORDER — BENAZEPRIL HCL 40 MG PO TABS: 40.0000 mg | ORAL_TABLET | Freq: Every day | ORAL | 3 refills | Status: AC

## 2023-04-25 NOTE — Progress Notes (Signed)
 Cardiology Office Note:    Date:  04/25/2023   ID:  Rebecca [name], DOB August 05, 1955, MRN [id number]  PCP:  Clemens Curt, MD    [facility] Providers Cardiologist:  Constancia Delton, MD     Referring MD: Clemens Curt, MD   Chief Complaint  Patient presents with   Follow-up    History of Present Illness:    Rebecca [name] is a 68 y.o. female with a hx of hypertension who presents for follow-up.  Did not tolerate amlodipine  in the past due to symptoms of fatigue and hair loss.  Patient was started on benazepril  with good effect.  Followed up with PCP, amlodipine -benazepril  restarted.  Patient states feeling fatigued while on amlodipine .  She stopped taking amlodipine  with resolution of symptoms.  Complains of weight gain, working on increasing activity level.  Past Medical History:  Diagnosis Date   Allergy    Cataract    beginnings of    Glaucoma    Heart murmur    noted on PCP exam 11/23/2012; "does not appear urgent"   Hemorrhoids    Hypertension    under control with meds., has been on med. > 20 yr.   Radius fracture 11/22/2012   left, comminuted    Past Surgical History:  Procedure Laterality Date   ABDOMINAL HYSTERECTOMY  06/02/2000   partial   COLONOSCOPY  03/2006   COLONOSCOPY WITH PROPOFOL   11/15/2011   ENDOMETRIAL ABLATION     OPEN REDUCTION INTERNAL FIXATION (ORIF) DISTAL RADIAL FRACTURE Left 11/29/2012   Procedure: OPEN REDUCTION INTERNAL FIXATION (ORIF) LEFT  DISTAL RADIAL FRACTURE;  Surgeon: Kemp Patter, MD;  Location: [facility];  Service: Orthopedics;  Laterality: Left;   PARTIAL HYSTERECTOMY      Current Medications: Current Meds  Medication Sig   albuterol  (VENTOLIN  HFA) 108 (90 Base) MCG/ACT inhaler Inhale 2 puffs into the lungs every 6 (six) hours as needed for shortness of breath.   benazepril  (LOTENSIN ) 40 MG tablet Take 1 tablet (40 mg total) by mouth daily.   benzonatate  (TESSALON  PERLES) 100 MG capsule Take 1-2  tabs TID prn cough   cholecalciferol (VITAMIN D3) 25 MCG (1000 UNIT) tablet Take 1,000 Units by mouth daily.   diclofenac  (VOLTAREN ) 75 MG EC tablet Take 1 tablet (75 mg total) by mouth 2 (two) times daily. Bid prn pain   fluticasone  (FLONASE ) 50 MCG/ACT nasal spray Place 2 sprays into both nostrils daily as needed for allergies or rhinitis.   hydrochlorothiazide  (HYDRODIURIL ) 50 MG tablet Take 1 tablet (50 mg total) by mouth daily.   hydrocortisone  (ANUCORT-HC ) 25 MG suppository UNWRAP AND INSERT ONE SUPPOSITORY RECTALLY EVERY NIGHT AT BEDTIME AS NEEDED FOR HEMORRHOIDS   hydrocortisone  (ANUSOL -HC) 2.5 % rectal cream Apply to affected area on hemorrhoids once daily as needed   latanoprost (XALATAN) 0.005 % ophthalmic solution Place 1 drop into both eyes.    lidocaine  (XYLOCAINE ) 2 % jelly Apply 1 Application topically 3 (three) times daily as needed (rectum).   Multiple Vitamin (MULTIVITAMIN) tablet Take 1 tablet by mouth daily.     potassium chloride  (KLOR-CON ) 10 MEQ tablet Take 1 tablet (10 mEq total) by mouth daily.   [DISCONTINUED] amLODipine -benazepril  (LOTREL) 5-20 MG capsule Take 1 capsule by mouth daily.     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   Occupation: retired  Tobacco Use   Smoking status: Never   Smokeless tobacco: Never  Vaping Use   Vaping status: Never Used  Substance and Sexual Activity   Alcohol use: No   Drug use: No   Sexual activity: Not on file  Other Topics Concern   Not on file  Social History Narrative   Regular exercise   Social Drivers of Health   Financial  Resource Strain: Low Risk  (03/08/2023)   Overall Financial Resource Strain (CARDIA)    Difficulty of Paying Living Expenses: Not hard at all  Food Insecurity: No Food Insecurity (03/08/2023)   Hunger Vital Sign    Worried About Running Out of Food in the Last Year: Never true    Ran Out of Food in the Last Year: Never true  Transportation Needs: No Transportation Needs (03/08/2023)   PRAPARE - Administrator, Civil Service (Medical): No    Lack of Transportation (Non-Medical): No  Physical Activity: Sufficiently Active (03/08/2023)   Exercise Vital Sign    Days of Exercise per Week: 3 days    Minutes of Exercise per Session: 60 min  Stress: No Stress Concern Present (03/08/2023)   [employer] of Occupational Health - Occupational Stress Questionnaire    Feeling of Stress : Not at all  Social Connections: Moderately Integrated (03/08/2023)   Social Connection and Isolation Panel [NHANES]    Frequency of Communication with Friends and Family: More than three times a week    Frequency of Social Gatherings with Friends and Family: More than three times a week    Attends Religious Services: More than 4 times per year    Active Member of [employer] or Organizations: Yes    Attends Engineer, structural: More than 4 times per year    Marital Status: Divorced     Family History: The patient's family history includes Breast cancer in her maternal aunt; Colon cancer in her maternal uncle, mother, and paternal uncle; Colon polyps in her sister; Coronary artery disease in her father; Dementia in her father and mother; Diabetes in her maternal aunt and mother; Heart attack in her mother; Heart disease in her father and mother; Hypertension in her father and mother. There is no history of Esophageal cancer, Rectal cancer, or Stomach cancer.  ROS:   Please see the history of present illness.     All other systems reviewed and are negative.  EKGs/Labs/Other Studies Reviewed:    The  following studies were reviewed today:        Recent Labs: 08/23/2022: Magnesium 2.0 09/14/2022: ALT 16; BUN 15; Creatinine, Ser 0.78; Hemoglobin 13.2; Platelets 312; Potassium 3.8; Sodium 138; TSH 0.89  Recent Lipid Panel    Component Value Date/Time   CHOL 206 (H) 02/24/2021 0924   TRIG 90.0 02/24/2021 0924   HDL 61.60 02/24/2021 0924   CHOLHDL 3 02/24/2021 0924   VLDL 18.0 02/24/2021 0924   LDLCALC 127 (H) 02/24/2021 0924   LDLDIRECT 107.3 08/11/2009 0817     Risk Assessment/Calculations:     Physical Exam:    VS:  BP 130/76 (BP Location: Left Arm, Patient Position: Sitting, Cuff Size: Normal)   Pulse 69   Ht 5\' 7"  (1.702 m)   Wt 230 lb  3.2 oz (104.4 kg)   SpO2 97%   BMI 36.05 kg/m     Wt Readings from Last 3 Encounters:  04/25/23 230 lb 3.2 oz (104.4 kg)  03/09/23 224 lb 6 oz (101.8 kg)  02/24/23 225 lb (102.1 kg)     GEN:  Well nourished, well developed in no acute distress HEENT: Normal NECK: No JVD; No carotid bruits CARDIAC: RRR, no murmurs, rubs, gallops RESPIRATORY:  Clear to auscultation without rales, wheezing or rhonchi  ABDOMEN: Soft, non-tender, non-distended MUSCULOSKELETAL:  No edema; No deformity  SKIN: Warm and dry NEUROLOGIC:  Alert and oriented x 3 PSYCHIATRIC:  Normal affect   ASSESSMENT:    1. Primary hypertension   2. BMI 36.0-36.9,adult     PLAN:    In order of problems listed above:  Hypertension, BP controlled.  Continue benazepril  40 mg daily, HCTZ 50 mg daily.  Low-salt diet advised, check BP at home and keep log.  Amlodipine  caused fatigue and hair loss previously. Obesity, increased activity, low-calorie diet advised.  Follow-up in 12 months.     Medication Adjustments/Labs and Tests Ordered: Current medicines are reviewed at length with the patient today.  Concerns regarding medicines are outlined above.  No orders of the defined types were placed in this encounter.  Meds ordered this encounter  Medications    benazepril  (LOTENSIN ) 40 MG tablet    Sig: Take 1 tablet (40 mg total) by mouth daily.    Dispense:  90 tablet    Refill:  3    Patient Instructions  Medication Instructions:  STOP Lotrel   Take Benazepril  40 mg daily   *If you need a refill on your cardiac medications before your next appointment, please call your pharmacy*   Follow-Up: At [facility], you and your health needs are our priority.  As part of our continuing mission to provide you with exceptional heart care, our providers are all part of one team.  This team includes your primary Cardiologist (physician) and Advanced Practice Providers or APPs (Physician Assistants and Nurse Practitioners) who all work together to provide you with the care you need, when you need it.  Your next appointment:   12 month(s)  Provider:   You may see Constancia Delton, MD or one of the following Advanced Practice Providers on your designated Care Team:   Laneta Pintos, NP Gildardo Labrador, PA-C Varney Gentleman, PA-C Cadence Tullos, PA-C Ronald Cockayne, NP Morey Ar, NP    We recommend signing up for the patient portal called "MyChart".  Sign up information is provided on this After Visit Summary.  MyChart is used to connect with patients for Virtual Visits (Telemedicine).  Patients are able to view lab/test results, encounter notes, upcoming appointments, etc.  Non-urgent messages can be sent to your provider as well.   To learn more about what you can do with MyChart, go to [email or web address].         Signed, Constancia Delton, MD  04/25/2023 9:44 AM     [facility]

## 2023-04-25 NOTE — Patient Instructions (Signed)
 Medication Instructions:  STOP Lotrel   Take Benazepril  40 mg daily   *If you need a refill on your cardiac medications before your next appointment, please call your pharmacy*   Follow-Up: At [facility], you and your health needs are our priority.  As part of our continuing mission to provide you with exceptional heart care, our providers are all part of one team.  This team includes your primary Cardiologist (physician) and Advanced Practice Providers or APPs (Physician Assistants and Nurse Practitioners) who all work together to provide you with the care you need, when you need it.  Your next appointment:   12 month(s)  Provider:   You may see Constancia Delton, MD or one of the following Advanced Practice Providers on your designated Care Team:   Laneta Pintos, NP Gildardo Labrador, PA-C Varney Gentleman, PA-C Cadence [place], PA-C Ronald Cockayne, NP Morey Ar, NP    We recommend signing up for the patient portal called "MyChart".  Sign up information is provided on this After Visit Summary.  MyChart is used to connect with patients for Virtual Visits (Telemedicine).  Patients are able to view lab/test results, encounter notes, upcoming appointments, etc.  Non-urgent messages can be sent to your provider as well.   To learn more about what you can do with MyChart, go to [email or web address].

## 2023-05-02 ENCOUNTER — Ambulatory Visit: Admitting: Physician Assistant

## 2023-05-10 ENCOUNTER — Ambulatory Visit (INDEPENDENT_AMBULATORY_CARE_PROVIDER_SITE_OTHER): Admitting: Physician Assistant

## 2023-05-10 ENCOUNTER — Other Ambulatory Visit (INDEPENDENT_AMBULATORY_CARE_PROVIDER_SITE_OTHER)

## 2023-05-10 DIAGNOSIS — M1712 Unilateral primary osteoarthritis, left knee: Secondary | ICD-10-CM | POA: Diagnosis not present

## 2023-05-10 DIAGNOSIS — M25562 Pain in left knee: Secondary | ICD-10-CM | POA: Diagnosis not present

## 2023-05-10 DIAGNOSIS — G8929 Other chronic pain: Secondary | ICD-10-CM | POA: Diagnosis not present

## 2023-05-10 MED ORDER — METHYLPREDNISOLONE ACETATE 40 MG/ML IJ SUSP
40.0000 mg | INTRAMUSCULAR | Status: AC | PRN
Start: 2023-05-10 — End: 2023-05-10
  Administered 2023-05-10: 40 mg via INTRA_ARTICULAR

## 2023-05-10 MED ORDER — TRAMADOL HCL 50 MG PO TABS: 50.0000 mg | ORAL_TABLET | Freq: Two times a day (BID) | ORAL | 2 refills | Status: AC | PRN

## 2023-05-10 MED ORDER — BUPIVACAINE HCL 0.25 % IJ SOLN
2.0000 mL | INTRAMUSCULAR | Status: AC | PRN
  Administered 2023-05-10: 2 mL via INTRA_ARTICULAR

## 2023-05-10 MED ORDER — LIDOCAINE HCL 1 % IJ SOLN
2.0000 mL | INTRAMUSCULAR | Status: AC | PRN
Start: 2023-05-10 — End: 2023-05-10
  Administered 2023-05-10: 2 mL

## 2023-05-10 NOTE — Progress Notes (Signed)
 Office Visit Note   Patient: Rebecca [name]           Date of Birth: 1955-03-24           MRN: [id number] Visit Date: 05/10/2023              Requested by: Tower, Manley Seeds, MD [address] [place],  [place] [address] PCP: Clemens Curt, MD   Assessment & Plan: Visit Diagnoses:  1. Unilateral primary osteoarthritis, left knee     Plan: Impression is left knee osteoarthritis.  Today we have discussed various treatment options to include intra-articular cortisone injection for which she would like to proceed.  Have also sent in tramadol to take as needed for pain.  She will follow-up with us  as needed.  Follow-Up Instructions: Return if symptoms worsen or fail to improve.   Orders:  Orders Placed This Encounter  Procedures   Large Joint Inj   XR KNEE 3 VIEW LEFT   Meds ordered this encounter  Medications   traMADol (ULTRAM) 50 MG tablet    Sig: Take 1 tablet (50 mg total) by mouth every 12 (twelve) hours as needed.    Dispense:  60 tablet    Refill:  2      Procedures: Large Joint Inj: L knee on 05/10/2023 2:30 PM Indications: pain Details: 22 G needle, anterolateral approach Medications: 2 mL lidocaine  1 %; 2 mL bupivacaine  0.25 %; 40 mg methylPREDNISolone acetate 40 MG/ML      Clinical Data: No additional findings.   Subjective: Chief Complaint  Patient presents with   Left Leg - Pain    HPI patient is a pleasant 68 year old female who comes in today with left knee pain.  Symptoms began about 4 to 6 weeks ago.  She denies any injury or change in activity.  She does note that her pain has slightly improved but is still somewhat persistent.  The pain is to the entire knee and is described as a constant ache.  She does have aggravating symptoms when she is walking or climbing steps.  She has been taking diclofenac  without relief.  No previous cortisone injection to the left knee.  Review of Systems as detailed in HPI.  All others reviewed and are  negative.   Objective: Vital Signs: There were no vitals taken for this visit.  Physical Exam well-developed well-nourished female in no acute distress.  Alert and oriented x 3.  Ortho Exam left knee exam: Small effusion.  Range of motion 0 to 115 degrees.  Very minimal medial joint line tenderness.  She is neurovascularly intact distally.  Specialty Comments:  No specialty comments available.  Imaging: XR KNEE 3 VIEW LEFT Result Date: 05/10/2023 X-rays demonstrate advanced degenerative changes to the medial and patellofemoral compartments    PMFS History: Patient Active Problem List   Diagnosis Date Noted   Cerumen impaction 03/09/2023   Gastroenteritis 12/27/2022   Leukopenia 09/14/2022   Hair loss 08/23/2022   Class 2 severe obesity due to excess calories with serious comorbidity and body mass index (BMI) of 35.0 to 35.9 in adult [facility]) 05/08/2022   Hepatic steatosis 01/05/2022   Crepitus of cervical spine 04/30/2021   Unilateral occipital headache 04/30/2021   Medicare annual wellness visit, subsequent 11/24/2020   Low back pain 10/14/2020   Breast pain 07/22/2020   Insomnia 07/23/2019   Heart rate problem 05/29/2019   Estrogen deficiency 11/27/2018   Encounter for pre-employment examination 11/02/2016   Fungal nail infection  04/27/2016   Sinusitis, chronic 03/12/2015   Fatigue 05/24/2014   Hyperlipidemia 10/30/2013   History of radius fracture 11/23/2012   Undiagnosed cardiac murmurs 11/23/2012   Family history of colon cancer requiring screening colonoscopy 09/14/2011   Routine general medical examination at a health care facility 09/05/2011   Mixed incontinence urge and stress 04/14/2011   Hemorrhoids, internal, with bleeding 10/12/2010   External hemorrhoids 11/04/2009   Allergic rhinitis 08/13/2009   Hypokalemia 04/28/2007   Prediabetes 11/15/2006   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   BUNIONS, RIGHT FOOT 07/19/2006   Herpes simplex virus (HSV) infection  07/06/2006   Essential hypertension 07/06/2006   URINARY INCONTINENCE, MIXED 07/06/2006   Past Medical History:  Diagnosis Date   Allergy    Cataract    beginnings of    Glaucoma    Heart murmur    noted on PCP exam 11/23/2012; "does not appear urgent"   Hemorrhoids    Hypertension    under control with meds., has been on med. > 20 yr.   Radius fracture 11/22/2012   left, comminuted    Family History  Problem Relation Age of Onset   Heart attack Mother    Heart disease Mother    Hypertension Mother    Colon cancer Mother    Diabetes Mother    Dementia Mother    Heart disease Father    Hypertension Father    Coronary artery disease Father        with CABG   Dementia Father    Colon polyps Sister    Breast cancer Maternal Aunt    Diabetes Maternal Aunt    Colon cancer Maternal Uncle    Colon cancer Paternal Uncle    Esophageal cancer Neg Hx    Rectal cancer Neg Hx    Stomach cancer Neg Hx     Past Surgical History:  Procedure Laterality Date   ABDOMINAL HYSTERECTOMY  06/02/2000   partial   COLONOSCOPY  03/2006   COLONOSCOPY WITH PROPOFOL   11/15/2011   ENDOMETRIAL ABLATION     OPEN REDUCTION INTERNAL FIXATION (ORIF) DISTAL RADIAL FRACTURE Left 11/29/2012   Procedure: OPEN REDUCTION INTERNAL FIXATION (ORIF) LEFT  DISTAL RADIAL FRACTURE;  Surgeon: Kemp Patter, MD;  Location: [facility];  Service: Orthopedics;  Laterality: Left;   PARTIAL HYSTERECTOMY     Social History   Occupational History   Occupation: Engineer, site, also going to school for teaching degree, 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   Occupation: retired  Tobacco Use   Smoking status: Never   Smokeless tobacco: Never  Vaping Use   Vaping status: Never Used  Substance and Sexual Activity   Alcohol use: No   Drug use: No   Sexual activity: Not on file

## 2023-05-20 ENCOUNTER — Other Ambulatory Visit (INDEPENDENT_AMBULATORY_CARE_PROVIDER_SITE_OTHER)

## 2023-05-20 DIAGNOSIS — I10 Essential (primary) hypertension: Secondary | ICD-10-CM

## 2023-05-20 DIAGNOSIS — E7800 Pure hypercholesterolemia, unspecified: Secondary | ICD-10-CM

## 2023-05-20 DIAGNOSIS — R7303 Prediabetes: Secondary | ICD-10-CM | POA: Diagnosis not present

## 2023-05-21 LAB — LIPID PANEL
Chol/HDL Ratio: 3 ratio (ref 0.0–4.4)
Cholesterol, Total: 210 mg/dL — ABNORMAL HIGH (ref 100–199)
HDL: 69 mg/dL (ref >39)
LDL Chol Calc (NIH): 131 mg/dL — ABNORMAL HIGH (ref 0–99)
Triglycerides: 55 mg/dL (ref 0–149)
VLDL Cholesterol Cal: 10 mg/dL (ref 5–40)

## 2023-05-21 LAB — CBC WITH DIFFERENTIAL/PLATELET
Basophils Absolute: 0 10*3/uL (ref 0.0–0.2)
Basos: 1 %
EOS (ABSOLUTE): 0.1 10*3/uL (ref 0.0–0.4)
Eos: 3 %
Hematocrit: 40.9 % (ref 34.0–46.6)
Hemoglobin: 13.7 g/dL (ref 11.1–15.9)
Immature Grans (Abs): 0 10*3/uL (ref 0.0–0.1)
Immature Granulocytes: 0 %
Lymphocytes Absolute: 1.6 10*3/uL (ref 0.7–3.1)
Lymphs: 46 %
MCH: 32.2 pg (ref 26.6–33.0)
MCHC: 33.5 g/dL (ref 31.5–35.7)
MCV: 96 fL (ref 79–97)
Monocytes Absolute: 0.3 10*3/uL (ref 0.1–0.9)
Monocytes: 9 %
Neutrophils Absolute: 1.4 10*3/uL (ref 1.4–7.0)
Neutrophils: 41 %
Platelets: 287 10*3/uL (ref 150–450)
RBC: 4.26 x10E6/uL (ref 3.77–5.28)
RDW: 13.3 % (ref 11.7–15.4)
WBC: 3.4 10*3/uL (ref 3.4–10.8)

## 2023-05-21 LAB — COMPREHENSIVE METABOLIC PANEL WITH GFR
ALT: 16 IU/L (ref 0–32)
AST: 16 IU/L (ref 0–40)
Albumin: 4.4 g/dL (ref 3.9–4.9)
Alkaline Phosphatase: 61 IU/L (ref 44–121)
BUN/Creatinine Ratio: 25 (ref 12–28)
BUN: 18 mg/dL (ref 8–27)
Bilirubin Total: 0.4 mg/dL (ref 0.0–1.2)
CO2: 24 mmol/L (ref 20–29)
Calcium: 9.3 mg/dL (ref 8.7–10.3)
Chloride: 103 mmol/L (ref 96–106)
Creatinine, Ser: 0.71 mg/dL (ref 0.57–1.00)
Globulin, Total: 2.4 g/dL (ref 1.5–4.5)
Glucose: 106 mg/dL — ABNORMAL HIGH (ref 70–99)
Potassium: 4.1 mmol/L (ref 3.5–5.2)
Sodium: 141 mmol/L (ref 134–144)
Total Protein: 6.8 g/dL (ref 6.0–8.5)
eGFR: 91 mL/min/{1.73_m2} (ref >59)

## 2023-05-21 LAB — TSH: TSH: 2.09 u[IU]/mL (ref 0.450–4.500)

## 2023-05-21 LAB — HEMOGLOBIN A1C
Est. average glucose Bld gHb Est-mCnc: 126 mg/dL
Hgb A1c MFr Bld: 6 % — ABNORMAL HIGH (ref 4.8–5.6)

## 2023-05-22 ENCOUNTER — Ambulatory Visit: Payer: Self-pay | Admitting: Family Medicine

## 2023-05-27 ENCOUNTER — Encounter: Payer: Self-pay | Admitting: Family Medicine

## 2023-05-27 ENCOUNTER — Ambulatory Visit (INDEPENDENT_AMBULATORY_CARE_PROVIDER_SITE_OTHER): Admitting: Family Medicine

## 2023-05-27 VITALS — BP 135/71 | HR 57 | Temp 97.8°F | Ht 66.75 in | Wt 222.0 lb

## 2023-05-27 DIAGNOSIS — I10 Essential (primary) hypertension: Secondary | ICD-10-CM

## 2023-05-27 DIAGNOSIS — E2839 Other primary ovarian failure: Secondary | ICD-10-CM

## 2023-05-27 DIAGNOSIS — Z0000 Encounter for general adult medical examination without abnormal findings: Secondary | ICD-10-CM | POA: Diagnosis not present

## 2023-05-27 DIAGNOSIS — Z6835 Body mass index (BMI) 35.0-35.9, adult: Secondary | ICD-10-CM

## 2023-05-27 DIAGNOSIS — R5382 Chronic fatigue, unspecified: Secondary | ICD-10-CM | POA: Diagnosis not present

## 2023-05-27 DIAGNOSIS — R7303 Prediabetes: Secondary | ICD-10-CM | POA: Diagnosis not present

## 2023-05-27 DIAGNOSIS — F5104 Psychophysiologic insomnia: Secondary | ICD-10-CM

## 2023-05-27 DIAGNOSIS — E66812 Obesity, class 2: Secondary | ICD-10-CM

## 2023-05-27 DIAGNOSIS — K760 Fatty (change of) liver, not elsewhere classified: Secondary | ICD-10-CM | POA: Diagnosis not present

## 2023-05-27 DIAGNOSIS — E876 Hypokalemia: Secondary | ICD-10-CM | POA: Diagnosis not present

## 2023-05-27 DIAGNOSIS — E7800 Pure hypercholesterolemia, unspecified: Secondary | ICD-10-CM

## 2023-05-27 NOTE — Patient Instructions (Addendum)
 Keep walking /stay active  Add some strength training to your routine, this is important for bone and brain health and can reduce your risk of falls and help your body use insulin properly and regulate weight  Light weights, exercise bands , and internet videos are a good way to start  Yoga (chair or regular), machines , floor exercises or a gym with machines are also good options  Get a routine going with your total gym   Keep working on weight loss  Try to get most of your carbohydrates from produce (with the exception of white potatoes) and whole grains Eat less bread/pasta/rice/snack foods/cereals/sweets and other items from the middle of the grocery store (processed carbs)  For cholesterol Avoid red meat/ fried foods/ egg yolks/ fatty breakfast meats/ butter, cheese and high fat dairy/ and shellfish      You have an order for:  []   3D Mammogram  [x]   Bone Density     Please call for appointment:   [x]   [facility]  [address] [place] [address]  [phone]  []   [facility])   [address]  [place], [place] [address]  [phone]  []   [facility]      [address] [place], [place]        [phone]         []   [facility]  [address] [place], [place]  [phone]  []  [facility]   [address]   [place], [place] [address]  [phone]  []  [facility]  [address] [place], [place] [address] [phone]    Make sure to wear two piece clothing  No lotions powders or deodorants the day of the appointment Make sure to bring picture ID and insurance card.  Bring list of medications you are currently taking including any supplements.   Schedule your screening mammogram through MyChart!    Select [place] imaging sites can now be scheduled through MyChart.  Log into your MyChart account.  Go to 'Visit' (or 'Appointments' if  on mobile App) --> Schedule an  Appointment  Under 'Select a Reason for Visit' choose the Mammogram  Screening option.  Complete the pre-visit questions  and select the time and place that  best fits your schedule

## 2023-05-27 NOTE — Assessment & Plan Note (Signed)
 bp in fair control at this time  BP Readings from Last 1 Encounters:  05/27/23 135/71   No changes needed Most recent labs reviewed  Disc lifstyle change with low sodium diet and exercise   Continue Benazapril 40 mg daily  Hydrochlorothiazide  50 mg daily   Labs reviewed  Cardiology note reviewed

## 2023-05-27 NOTE — Assessment & Plan Note (Signed)
 Reviewed health habits including diet and exercise and skin cancer prevention Reviewed appropriate screening tests for age  Also reviewed health mt list, fam hx and immunization status , as well as social and family history   See HPI Labs reviewed and ordered Health Maintenance  Topic Date Due   Hepatitis C Screening  Never done   COVID-19 Vaccine (8 - 2024-25 season) 03/24/2024*   DTaP/Tdap/Td vaccine (3 - Td or Tdap) 05/26/2024*   Medicare Annual Wellness Visit  08/04/2023   Flu Shot  08/05/2023   Mammogram  08/29/2024   Colon Cancer Screening  07/14/2026   Pneumonia Vaccine  Completed   DEXA scan (bone density measurement)  Completed   Zoster (Shingles) Vaccine  Completed   HPV Vaccine  Aged Out   Meningitis B Vaccine  Aged Out  *Topic was postponed. The date shown is not the original due date.    Commended re: weight loss  Dexa ordered Discussed fall prevention, supplements and exercise for bone density  PHQ is 5 due to fatigue /poor sleep   not depressed  Labs reviewed

## 2023-05-27 NOTE — Assessment & Plan Note (Signed)
 Lab Results  Component Value Date   K 4.1 05/20/2023    Takes hydrochlorothiazide  Plan to continue klor con 10 meq daily

## 2023-05-27 NOTE — Progress Notes (Signed)
 Subjective:    Patient ID: Rebecca [name], female    DOB: Jan 27, 1955, 68 y.o.   MRN: [id number]  HPI  Here for health maintenance exam and to review chronic medical problems   Wt Readings from Last 3 Encounters:  05/27/23 222 lb (100.7 kg)  04/25/23 230 lb 3.2 oz (104.4 kg)  03/09/23 224 lb 6 oz (101.8 kg)   35.03 kg/m  Vitals:   05/27/23 0815 05/27/23 0845  BP: 136/78 135/71  Pulse: (!) 57   Temp: 97.8 F (36.6 C)   SpO2: 97%     Immunization History  Administered Date(s) Administered   Fluad Quad(high Dose 65+) 10/10/2018, 12/03/2019, 11/24/2020   Fluad Trivalent(High Dose 65+) 03/09/2023   Influenza,inj,Quad PF,6+ Mos 11/20/2012, 10/30/2013, 03/12/2015, 12/16/2015, 09/14/2016, 11/25/2017   Influenza-Unspecified 10/03/2021   PFIZER Comirnaty(Gray Top)Covid-19 Tri-Sucrose Vaccine 05/19/2020   PFIZER(Purple Top)SARS-COV-2 Vaccination 03/30/2019, 04/24/2019, 12/01/2019   PPD Test 11/02/2016   Pfizer Covid-19 Vaccine Bivalent Booster 43yrs & up 12/19/2020   Pfizer(Comirnaty)Fall Seasonal Vaccine 12 years and older 03/19/2022   Pneumococcal Conjugate-13 11/27/2018   Pneumococcal Polysaccharide-23 12/19/2019   Pneumococcal-Unspecified 10/06/2020   Respiratory Syncytial Virus Vaccine,Recomb Aduvanted(Arexvy) 03/18/2022   Td 01/03/2003   Tdap 11/20/2012   Unspecified SARS-COV-2 Vaccination 10/06/2020   Zoster Recombinant(Shingrix) 12/19/2020, 02/25/2021    Health Maintenance Due  Topic Date Due   Hepatitis C Screening  Never done   Really working on weight     Mammogram 08/2022 Self breast exam- no lumps   Her arm pits give out a different odor    Gyn health No problems    Colon cancer screening colonoscopy 07/2021   Bone health  Dexa  12/2018-normal range  Falls- none  Fractures-none  Supplements -vit D Last vitamin D  Lab Results  Component Value Date   VD25OH 41.20 08/23/2022    Exercise  Walking  Also part time work on feet  Has a total  gym/not using    Mood    05/27/2023    8:22 AM 03/09/2023    3:13 PM 09/14/2022    2:34 PM 08/23/2022    8:30 AM 08/04/2022    9:18 AM  Depression screen PHQ 2/9  Decreased Interest 0 0 0 0 0  Down, Depressed, Hopeless 0 0 0 0 0  PHQ - 2 Score 0 0 0 0 0  Altered sleeping 3 0 0 0 0  Tired, decreased energy 2 0 1 0 0  Change in appetite 0 0 0 0 0  Feeling bad or failure about yourself  0 0 0 0 0  Trouble concentrating 0 0 0 0 0  Moving slowly or fidgety/restless 0 0 0 0 0  Suicidal thoughts 0 0 0 0 0  PHQ-9 Score 5 0 1 0 0  Difficult doing work/chores  Not difficult at all Somewhat difficult Not difficult at all Not difficult at all    HTN bp is stable today  No cp or palpitations or headaches or edema  No side effects to medicines  BP Readings from Last 3 Encounters:  05/27/23 135/71  04/25/23 130/76  03/09/23 139/80    Stopped amlodipine  due to fatigue - it did help but not has some new fatigue (does not sleep well)  Sees cardiology  Benazepril  40 mg daily  Hydrochlorothiazide  50 mg daily    Pulse Readings from Last 3 Encounters:  05/27/23 (!) 57  04/25/23 69  03/09/23 73   Lab Results  Component Value Date   NA 141 05/20/2023  K 4.1 05/20/2023   CO2 24 05/20/2023   GLUCOSE 106 (H) 05/20/2023   BUN 18 05/20/2023   CREATININE 0.71 05/20/2023   CALCIUM 9.3 05/20/2023   GFR 77.52 09/14/2022   EGFR 91 05/20/2023   GFRNONAA >60 08/03/2020   Hepatic steatosis  Lab Results  Component Value Date   ALT 16 05/20/2023   AST 16 05/20/2023   ALKPHOS 61 05/20/2023   BILITOT 0.4 05/20/2023    Hyperlipidemia Lab Results  Component Value Date   CHOL 210 (H) 05/20/2023   CHOL 206 (H) 02/24/2021   CHOL 247 (H) 10/14/2020   Lab Results  Component Value Date   HDL 69 05/20/2023   HDL 61.60 02/24/2021   HDL 67.30 10/14/2020   Lab Results  Component Value Date   LDLCALC 131 (H) 05/20/2023   LDLCALC 127 (H) 02/24/2021   LDLCALC 165 (H) 10/14/2020   Lab  Results  Component Value Date   TRIG 55 05/20/2023   TRIG 90.0 02/24/2021   TRIG 75.0 10/14/2020   Lab Results  Component Value Date   CHOLHDL 3.0 05/20/2023   CHOLHDL 3 02/24/2021   CHOLHDL 4 10/14/2020   Lab Results  Component Value Date   LDLDIRECT 107.3 08/11/2009    Prediabetes Lab Results  Component Value Date   HGBA1C 6.0 (H) 05/20/2023   HGBA1C 5.8 (A) 05/06/2022   HGBA1C 6.5 01/05/2022   . Lab Results  Component Value Date   TSH 2.090 05/20/2023   Lab Results  Component Value Date   WBC 3.4 05/20/2023   HGB 13.7 05/20/2023   HCT 40.9 05/20/2023   MCV 96 05/20/2023   PLT 287 05/20/2023      Patient Active Problem List   Diagnosis Date Noted   Leukopenia 09/14/2022   Hair loss 08/23/2022   Class 2 severe obesity due to excess calories with serious comorbidity and body mass index (BMI) of 35.0 to 35.9 in adult [facility]) 05/08/2022   Hepatic steatosis 01/05/2022   Crepitus of cervical spine 04/30/2021   Medicare annual wellness visit, subsequent 11/24/2020   Low back pain 10/14/2020   Insomnia 07/23/2019   Heart rate problem 05/29/2019   Estrogen deficiency 11/27/2018   Fungal nail infection 04/27/2016   Sinusitis, chronic 03/12/2015   Fatigue 05/24/2014   Hyperlipidemia 10/30/2013   History of radius fracture 11/23/2012   Family history of colon cancer requiring screening colonoscopy 09/14/2011   Routine general medical examination at a health care facility 09/05/2011   Mixed incontinence urge and stress 04/14/2011   Hemorrhoids, internal, with bleeding 10/12/2010   External hemorrhoids 11/04/2009   Allergic rhinitis 08/13/2009   Hypokalemia 04/28/2007   Prediabetes 11/15/2006   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   BUNIONS, RIGHT FOOT 07/19/2006   Herpes simplex virus (HSV) infection 07/06/2006   Essential hypertension 07/06/2006   URINARY INCONTINENCE, MIXED 07/06/2006   Past Medical History:  Diagnosis Date   Allergy    Cataract     beginnings of    Glaucoma    Heart murmur    noted on PCP exam 11/23/2012; "does not appear urgent"   Hemorrhoids    Hypertension    under control with meds., has been on med. > 20 yr.   Radius fracture 11/22/2012   left, comminuted   Past Surgical History:  Procedure Laterality Date   ABDOMINAL HYSTERECTOMY  06/02/2000   partial   COLONOSCOPY  03/2006   COLONOSCOPY WITH PROPOFOL   11/15/2011   ENDOMETRIAL ABLATION     OPEN  REDUCTION INTERNAL FIXATION (ORIF) DISTAL RADIAL FRACTURE Left 11/29/2012   Procedure: OPEN REDUCTION INTERNAL FIXATION (ORIF) LEFT  DISTAL RADIAL FRACTURE;  Surgeon: Kemp Patter, MD;  Location: [facility];  Service: Orthopedics;  Laterality: Left;   PARTIAL HYSTERECTOMY     Social History   Tobacco Use   Smoking status: Never   Smokeless tobacco: Never  Vaping Use   Vaping status: Never Used  Substance Use Topics   Alcohol use: No   Drug use: No   Family History  Problem Relation Age of Onset   Heart attack Mother    Heart disease Mother    Hypertension Mother    Colon cancer Mother    Diabetes Mother    Dementia Mother    Heart disease Father    Hypertension Father    Coronary artery disease Father        with CABG   Dementia Father    Colon polyps Sister    Breast cancer Maternal Aunt    Diabetes Maternal Aunt    Colon cancer Maternal Uncle    Colon cancer Paternal Uncle    Esophageal cancer Neg Hx    Rectal cancer Neg Hx    Stomach cancer Neg Hx    Allergies  Allergen Reactions   Amoxicillin  Hives and Swelling   Spironolactone Diarrhea   Augmentin  [Amoxicillin -Pot Clavulanate] Diarrhea    Tore stomach up   Current Outpatient Medications on File Prior to Visit  Medication Sig Dispense Refill   albuterol  (VENTOLIN  HFA) 108 (90 Base) MCG/ACT inhaler Inhale 2 puffs into the lungs every 6 (six) hours as needed for shortness of breath. 6.7 g 1   benazepril  (LOTENSIN ) 40 MG tablet Take 1 tablet (40 mg total) by mouth daily.  90 tablet 3   benzonatate  (TESSALON  PERLES) 100 MG capsule Take 1-2 tabs TID prn cough 30 capsule 0   cholecalciferol (VITAMIN D3) 25 MCG (1000 UNIT) tablet Take 1,000 Units by mouth daily.     diclofenac  (VOLTAREN ) 75 MG EC tablet Take 1 tablet (75 mg total) by mouth 2 (two) times daily. Bid prn pain 60 tablet 2   fluticasone  (FLONASE ) 50 MCG/ACT nasal spray Place 2 sprays into both nostrils daily as needed for allergies or rhinitis. 48 g 3   hydrochlorothiazide  (HYDRODIURIL ) 50 MG tablet Take 1 tablet (50 mg total) by mouth daily. 90 tablet 3   hydrocortisone  (ANUCORT-HC ) 25 MG suppository UNWRAP AND INSERT ONE SUPPOSITORY RECTALLY EVERY NIGHT AT BEDTIME AS NEEDED FOR HEMORRHOIDS 12 suppository 1   hydrocortisone  (ANUSOL -HC) 2.5 % rectal cream Apply to affected area on hemorrhoids once daily as needed 30 g 0   latanoprost (XALATAN) 0.005 % ophthalmic solution Place 1 drop into both eyes.      lidocaine  (XYLOCAINE ) 2 % jelly Apply 1 Application topically 3 (three) times daily as needed (rectum). 30 mL 1   Multiple Vitamin (MULTIVITAMIN) tablet Take 1 tablet by mouth daily.       potassium chloride  (KLOR-CON ) 10 MEQ tablet Take 1 tablet (10 mEq total) by mouth daily. 90 tablet 1   traMADol  (ULTRAM ) 50 MG tablet Take 1 tablet (50 mg total) by mouth every 12 (twelve) hours as needed. 60 tablet 2   No current facility-administered medications on file prior to visit.    Review of Systems  Constitutional:  Positive for activity change and fatigue. Negative for appetite change, fever and unexpected weight change.  HENT:  Negative for congestion, ear pain, rhinorrhea, sinus pressure  and sore throat.   Eyes:  Negative for pain, redness and visual disturbance.  Respiratory:  Negative for cough, shortness of breath and wheezing.   Cardiovascular:  Negative for chest pain and palpitations.  Gastrointestinal:  Negative for abdominal pain, blood in stool, constipation and diarrhea.  Endocrine: Negative  for polydipsia and polyuria.  Genitourinary:  Negative for dysuria, frequency and urgency.       Different body odor from axilla  No skin changes or breast lumps   Musculoskeletal:  Negative for arthralgias, back pain and myalgias.  Skin:  Negative for pallor and rash.  Allergic/Immunologic: Negative for environmental allergies.  Neurological:  Negative for dizziness, syncope and headaches.  Hematological:  Negative for adenopathy. Does not bruise/bleed easily.  Psychiatric/Behavioral:  Positive for sleep disturbance. Negative for decreased concentration and dysphoric mood. The patient is not nervous/anxious.        Objective:   Physical Exam Constitutional:      General: She is not in acute distress.    Appearance: Normal appearance. She is well-developed. She is obese. She is not ill-appearing or diaphoretic.  HENT:     Head: Normocephalic and atraumatic.     Right Ear: Tympanic membrane, ear canal and external ear normal.     Left Ear: Tympanic membrane, ear canal and external ear normal.     Nose: Nose normal. No congestion.     Mouth/Throat:     Mouth: Mucous membranes are moist.     Pharynx: Oropharynx is clear. No posterior oropharyngeal erythema.  Eyes:     General: No scleral icterus.    Extraocular Movements: Extraocular movements intact.     Conjunctiva/sclera: Conjunctivae normal.     Pupils: Pupils are equal, round, and reactive to light.  Neck:     Thyroid : No thyromegaly.     Vascular: No carotid bruit or JVD.  Cardiovascular:     Rate and Rhythm: Normal rate and regular rhythm.     Pulses: Normal pulses.     Heart sounds: Normal heart sounds.     No gallop.  Pulmonary:     Effort: Pulmonary effort is normal. No respiratory distress.     Breath sounds: Normal breath sounds. No wheezing.     Comments: Good air exch Chest:     Chest wall: No tenderness.  Abdominal:     General: Bowel sounds are normal. There is no distension or abdominal bruit.      Palpations: Abdomen is soft. There is no mass.     Tenderness: There is no abdominal tenderness.     Hernia: No hernia is present.  Genitourinary:    Comments: Breast exam: No mass, nodules, thickening, tenderness, bulging, retraction, inflamation, nipple discharge or skin changes noted.  No axillary or clavicular LA.     Musculoskeletal:        General: No tenderness. Normal range of motion.     Cervical back: Normal range of motion and neck supple. No rigidity. No muscular tenderness.     Right lower leg: No edema.     Left lower leg: No edema.     Comments: No kyphosis   Lymphadenopathy:     Cervical: No cervical adenopathy.  Skin:    General: Skin is warm and dry.     Coloration: Skin is not pale.     Findings: No erythema or rash.     Comments: Some areas of mild hyperpigmentation under arms   Few lentigines   Neurological:  Mental Status: She is alert. Mental status is at baseline.     Cranial Nerves: No cranial nerve deficit.     Motor: No abnormal muscle tone.     Coordination: Coordination normal.     Gait: Gait normal.     Deep Tendon Reflexes: Reflexes are normal and symmetric. Reflexes normal.  Psychiatric:        Mood and Affect: Mood normal.        Cognition and Memory: Cognition and memory normal.           Assessment & Plan:   Problem List Items Addressed This Visit       Cardiovascular and Mediastinum   Essential hypertension   bp in fair control at this time  BP Readings from Last 1 Encounters:  05/27/23 135/71   No changes needed Most recent labs reviewed  Disc lifstyle change with low sodium diet and exercise   Continue Benazapril 40 mg daily  Hydrochlorothiazide  50 mg daily   Labs reviewed  Cardiology note reviewed          Digestive   Hepatic steatosis   LFTs continue to be normal Lab Results  Component Value Date   ALT 16 05/20/2023   AST 16 05/20/2023   ALKPHOS 61 05/20/2023   BILITOT 0.4 05/20/2023   Will  watch Working on [employer] loss/ commended         Other   Routine general medical examination at a health care facility - Primary   Reviewed health habits including diet and exercise and skin cancer prevention Reviewed appropriate screening tests for age  Also reviewed health mt list, fam hx and immunization status , as well as social and family history   See HPI Labs reviewed and ordered Health Maintenance  Topic Date Due   Hepatitis C Screening  Never done   COVID-19 Vaccine (8 - 2024-25 season) 03/24/2024*   DTaP/Tdap/Td vaccine (3 - Td or Tdap) 05/26/2024*   Medicare Annual Wellness Visit  08/04/2023   Flu Shot  08/05/2023   Mammogram  08/29/2024   Colon Cancer Screening  07/14/2026   Pneumonia Vaccine  Completed   DEXA scan (bone density measurement)  Completed   Zoster (Shingles) Vaccine  Completed   HPV Vaccine  Aged Out   Meningitis B Vaccine  Aged Out  *Topic was postponed. The date shown is not the original due date.    Commended re: weight loss  Dexa ordered Discussed fall prevention, supplements and exercise for bone density  PHQ is 5 due to fatigue /poor sleep   not depressed  Labs reviewed       Prediabetes   Lab Results  Component Value Date   HGBA1C 6.0 (H) 05/20/2023   HGBA1C 5.8 (A) 05/06/2022   HGBA1C 6.5 01/05/2022   disc imp of low glycemic diet and wt loss to prevent DM2       Insomnia   Persists - causing fatigue  Encouraged sleep hygiene       Hypokalemia   Lab Results  Component Value Date   K 4.1 05/20/2023    Takes hydrochlorothiazide  Plan to continue klor con 10 meq daily      Hyperlipidemia   Disc goals for lipids and reasons to control them Rev last labs with pt Rev low sat fat diet in detail Fairly stable  LDL 131 with HDL close to 70        Fatigue   Acute on chronic  Stopping amlodipine  did help some  Does not sleep well/short periods  Discussed sleep hygiene and self care  Has had sleep study and no apnea        Estrogen deficiency   Dexa ordered       Relevant Orders   DG Bone Density   Class 2 severe obesity due to excess calories with serious comorbidity and body mass index (BMI) of 35.0 to 35.9 in adult [facility])   Discussed how this problem influences overall health and the risks it imposes  Reviewed plan for weight loss with lower calorie diet (via better food choices (lower glycemic and portion control) along with exercise building up to or more than 30 minutes 5 days per week including some aerobic activity and strength training   Has lost 8 lb with better diet  Encouraged to start some strength training

## 2023-05-27 NOTE — Assessment & Plan Note (Signed)
 LFTs continue to be normal Lab Results  Component Value Date   ALT 16 05/20/2023   AST 16 05/20/2023   ALKPHOS 61 05/20/2023   BILITOT 0.4 05/20/2023   Will watch Working on [employer] loss/ commended

## 2023-05-27 NOTE — Assessment & Plan Note (Signed)
 Persists - causing fatigue  Encouraged sleep hygiene

## 2023-05-27 NOTE — Assessment & Plan Note (Signed)
 Lab Results  Component Value Date   HGBA1C 6.0 (H) 05/20/2023   HGBA1C 5.8 (A) 05/06/2022   HGBA1C 6.5 01/05/2022   disc imp of low glycemic diet and wt loss to prevent DM2

## 2023-05-27 NOTE — Assessment & Plan Note (Signed)
 Dexa ordered

## 2023-05-27 NOTE — Assessment & Plan Note (Signed)
 Disc goals for lipids and reasons to control them Rev last labs with pt Rev low sat fat diet in detail Fairly stable  LDL 131 with HDL close to 70

## 2023-05-27 NOTE — Assessment & Plan Note (Signed)
 Acute on chronic  Stopping amlodipine  did help some   Does not sleep well/short periods  Discussed sleep hygiene and self care  Has had sleep study and no apnea

## 2023-05-27 NOTE — Assessment & Plan Note (Signed)
 Discussed how this problem influences overall health and the risks it imposes  Reviewed plan for weight loss with lower calorie diet (via better food choices (lower glycemic and portion control) along with exercise building up to or more than 30 minutes 5 days per week including some aerobic activity and strength training   Has lost 8 lb with better diet  Encouraged to start some strength training

## 2023-07-15 ENCOUNTER — Ambulatory Visit
Admission: EM | Admit: 2023-07-15 | Discharge: 2023-07-15 | Disposition: A | Discharge status: Home / Self Care | Attending: Emergency Medicine | Admitting: Emergency Medicine

## 2023-07-15 DIAGNOSIS — H9201 Otalgia, right ear: Secondary | ICD-10-CM | POA: Diagnosis not present

## 2023-07-15 DIAGNOSIS — J069 Acute upper respiratory infection, unspecified: Secondary | ICD-10-CM | POA: Diagnosis not present

## 2023-07-15 MED ORDER — AZITHROMYCIN 250 MG PO TABS: 250.0000 mg | ORAL_TABLET | Freq: Every day | ORAL | 0 refills | Status: DC

## 2023-07-15 NOTE — ED Provider Notes (Signed)
 Rebecca [name]    CSN: [id number] Arrival date & time: 07/15/23  1734      History   Chief Complaint Chief Complaint  Patient presents with   Otalgia    HPI Rebecca [name] is a 68 y.o. female.  Patient presents with 3-week history of right ear pain.  She has 1 week history of bilateral eye itching, redness, tearing.  She also has mild nasal congestion and sinus pressure.  No fever, ear drainage, sore throat, cough, shortness of breath.  She has been treating her symptoms with allergy medication.  Her medical history includes chronic sinusitis.  The history is provided by the patient and medical records.    Past Medical History:  Diagnosis Date   Allergy    Cataract    beginnings of    Glaucoma    Heart murmur    noted on PCP exam 11/23/2012; does not appear urgent   Hemorrhoids    Hypertension    under control with meds., has been on med. > 20 yr.   Radius fracture 11/22/2012   left, comminuted    Patient Active Problem List   Diagnosis Date Noted   Leukopenia 09/14/2022   Hair loss 08/23/2022   Class 2 severe obesity due to excess calories with serious comorbidity and body mass index (BMI) of 35.0 to 35.9 in adult [facility]) 05/08/2022   Hepatic steatosis 01/05/2022   Crepitus of cervical spine 04/30/2021   Medicare annual wellness visit, subsequent 11/24/2020   Low back pain 10/14/2020   Insomnia 07/23/2019   Heart rate problem 05/29/2019   Estrogen deficiency 11/27/2018   Fungal nail infection 04/27/2016   Sinusitis, chronic 03/12/2015   Fatigue 05/24/2014   Hyperlipidemia 10/30/2013   History of radius fracture 11/23/2012   Family history of colon cancer requiring screening colonoscopy 09/14/2011   Routine general medical examination at a health care facility 09/05/2011   Mixed incontinence urge and stress 04/14/2011   Hemorrhoids, internal, with bleeding 10/12/2010   External hemorrhoids 11/04/2009   Allergic rhinitis 08/13/2009   Hypokalemia  04/28/2007   Prediabetes 11/15/2006   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   BUNIONS, RIGHT FOOT 07/19/2006   Herpes simplex virus (HSV) infection 07/06/2006   Essential hypertension 07/06/2006   URINARY INCONTINENCE, MIXED 07/06/2006    Past Surgical History:  Procedure Laterality Date   ABDOMINAL HYSTERECTOMY  06/02/2000   partial   COLONOSCOPY  03/2006   COLONOSCOPY WITH PROPOFOL   11/15/2011   ENDOMETRIAL ABLATION     OPEN REDUCTION INTERNAL FIXATION (ORIF) DISTAL RADIAL FRACTURE Left 11/29/2012   Procedure: OPEN REDUCTION INTERNAL FIXATION (ORIF) LEFT  DISTAL RADIAL FRACTURE;  Surgeon: Arley JONELLE Curia, MD;  Location: [facility];  Service: Orthopedics;  Laterality: Left;   PARTIAL HYSTERECTOMY      OB History   No obstetric history on file.      Home Medications    Prior to Admission medications   Medication Sig Start Date End Date Taking? Authorizing Provider  azithromycin  (ZITHROMAX ) 250 MG tablet Take 1 tablet (250 mg total) by mouth daily. Take first 2 tablets together, then 1 every day until finished. 07/15/23  Yes Corlis Burnard DEL, NP  albuterol  (VENTOLIN  HFA) 108 (90 Base) MCG/ACT inhaler Inhale 2 puffs into the lungs every 6 (six) hours as needed for shortness of breath. 02/24/23   Menshew, Candida LULLA Kings, PA-C  benazepril  (LOTENSIN ) 40 MG tablet Take 1 tablet (40 mg total) by mouth daily. 04/25/23   Darliss Rogue, MD  benzonatate  (TESSALON  PERLES) 100 MG capsule Take 1-2 tabs TID prn cough Patient not taking: Reported on 07/15/2023 02/24/23   Menshew, Candida LULLA Kings, PA-C  cholecalciferol (VITAMIN D3) 25 MCG (1000 UNIT) tablet Take 1,000 Units by mouth daily.    [provider]  diclofenac  (VOLTAREN ) 75 MG EC tablet Take 1 tablet (75 mg total) by mouth 2 (two) times daily. Bid prn pain 09/01/21   Jule Ronal CROME, PA-C  fluticasone  (FLONASE ) 50 MCG/ACT nasal spray Place 2 sprays into both nostrils daily as needed for allergies or rhinitis. 11/25/17    Tower, Laine LABOR, MD  hydrochlorothiazide  (HYDRODIURIL ) 50 MG tablet Take 1 tablet (50 mg total) by mouth daily. 09/14/22   Tower, Laine LABOR, MD  hydrocortisone  (ANUCORT-HC ) 25 MG suppository UNWRAP AND INSERT ONE SUPPOSITORY RECTALLY EVERY NIGHT AT BEDTIME AS NEEDED FOR HEMORRHOIDS 06/08/22   Collier, Amanda R, PA-C  hydrocortisone  (ANUSOL -HC) 2.5 % rectal cream Apply to affected area on hemorrhoids once daily as needed 10/10/15   Clark, Katherine K, NP  latanoprost (XALATAN) 0.005 % ophthalmic solution Place 1 drop into both eyes.  06/27/13   [provider]  lidocaine  (XYLOCAINE ) 2 % jelly Apply 1 Application topically 3 (three) times daily as needed (rectum). 06/08/22   Craig Alan SAUNDERS, PA-C  Multiple Vitamin (MULTIVITAMIN) tablet Take 1 tablet by mouth daily.      [provider]  potassium chloride  (KLOR-CON ) 10 MEQ tablet Take 1 tablet (10 mEq total) by mouth daily. 03/09/23   Tower, Laine LABOR, MD  traMADol  (ULTRAM ) 50 MG tablet Take 1 tablet (50 mg total) by mouth every 12 (twelve) hours as needed. 05/10/23   Jule Ronal CROME, PA-C    Family History Family History  Problem Relation Age of Onset   Heart attack Mother    Heart disease Mother    Hypertension Mother    Colon cancer Mother    Diabetes Mother    Dementia Mother    Heart disease Father    Hypertension Father    Coronary artery disease Father        with CABG   Dementia Father    Colon polyps Sister    Breast cancer Maternal Aunt    Diabetes Maternal Aunt    Colon cancer Maternal Uncle    Colon cancer Paternal Uncle    Esophageal cancer Neg Hx    Rectal cancer Neg Hx    Stomach cancer Neg Hx     Social History Social History   Tobacco Use   Smoking status: Never   Smokeless tobacco: Never  Vaping Use   Vaping status: Never Used  Substance Use Topics   Alcohol use: No   Drug use: No     Allergies   Amoxicillin , Spironolactone, and Augmentin  [amoxicillin -pot clavulanate]   Review of  Systems Review of Systems  Constitutional:  Negative for chills and fever.  HENT:  Positive for congestion, ear pain and sinus pressure. Negative for ear discharge and sore throat.   Eyes:  Negative for pain, redness, itching and visual disturbance.  Respiratory:  Negative for cough and shortness of breath.      Physical Exam Triage Vital Signs ED Triage Vitals  Encounter Vitals Group     BP 07/15/23 1753 138/89     Girls Systolic BP Percentile --      Girls Diastolic BP Percentile --      Boys Systolic BP Percentile --      Boys Diastolic BP Percentile --  Pulse Rate 07/15/23 1753 71     Resp 07/15/23 1753 18     Temp 07/15/23 1753 97.9 F (36.6 C)     Temp src --      SpO2 07/15/23 1753 96 %     Weight --      Height --      Head Circumference --      Peak Flow --      Pain Score 07/15/23 1751 8     Pain Loc --      Pain Education --      Exclude from Growth Chart --    No data found.  Updated Vital Signs BP 138/89   Pulse 71   Temp 97.9 F (36.6 C)   Resp 18   SpO2 96%   Visual Acuity Right Eye Distance:   Left Eye Distance:   Bilateral Distance:    Right Eye Near:   Left Eye Near:    Bilateral Near:     Physical Exam Constitutional:      General: She is not in acute distress. HENT:     Right Ear: Tympanic membrane normal.     Left Ear: Tympanic membrane normal.     Nose: Nose normal.     Mouth/Throat:     Mouth: Mucous membranes are moist.     Pharynx: Oropharynx is clear.  Cardiovascular:     Rate and Rhythm: Normal rate and regular rhythm.     Heart sounds: Normal heart sounds.  Pulmonary:     Effort: Pulmonary effort is normal. No respiratory distress.     Breath sounds: Normal breath sounds.  Neurological:     Mental Status: She is alert.      UC Treatments / Results  Labs (all labs ordered are listed, but only abnormal results are displayed) Labs Reviewed - No data to display  EKG   Radiology No results  found.  Procedures Procedures (including critical care time)  Medications Ordered in UC Medications - No data to display  Initial Impression / Assessment and Plan / UC Course  I have reviewed the triage vital signs and the nursing notes.  Pertinent labs & imaging results that were available during my care of the patient were reviewed by me and considered in my medical decision making (see chart for details).    Right otalgia, URI.  Afebrile and vital signs are stable.  Patient has been symptomatic for 3 weeks.  Treating today with Zithromax  (allergy to penicillin).  Education provided on ear pain and URI.  Instructed patient to follow up with her PCP if her symptoms are not improving.  She agrees to plan of care.   Final Clinical Impressions(s) / UC Diagnoses   Final diagnoses:  Acute otalgia, right  Acute upper respiratory infection     Discharge Instructions      Take the Zithromax  as directed.  Follow-up with your primary care provider if your symptoms are not improving.      ED Prescriptions     Medication Sig Dispense Auth. Provider   azithromycin  (ZITHROMAX ) 250 MG tablet Take 1 tablet (250 mg total) by mouth daily. Take first 2 tablets together, then 1 every day until finished. 6 tablet Corlis Burnard DEL, NP      PDMP not reviewed this encounter.   Corlis Burnard DEL, NP 07/15/23 [phone]

## 2023-07-15 NOTE — Discharge Instructions (Addendum)
 Take the Zithromax as directed.  Follow up with your primary care provider if your symptoms are not improving.

## 2023-07-15 NOTE — ED Triage Notes (Signed)
 Patient to Urgent Care with complaints of right sided ear pain/ eye itching and watering.  Symptoms started 3 weeks ago.   Taking otc allergy meds.

## 2023-08-15 ENCOUNTER — Other Ambulatory Visit: Payer: Self-pay | Admitting: Family Medicine

## 2023-08-15 DIAGNOSIS — Z1231 Encounter for screening mammogram for malignant neoplasm of breast: Secondary | ICD-10-CM

## 2023-08-22 ENCOUNTER — Ambulatory Visit (INDEPENDENT_AMBULATORY_CARE_PROVIDER_SITE_OTHER): Admitting: Family Medicine

## 2023-08-22 ENCOUNTER — Encounter: Payer: Self-pay | Admitting: Family Medicine

## 2023-08-22 VITALS — BP 130/80 | HR 64 | Temp 97.3°F | Ht 66.75 in | Wt 229.4 lb

## 2023-08-22 DIAGNOSIS — I10 Essential (primary) hypertension: Secondary | ICD-10-CM | POA: Diagnosis not present

## 2023-08-22 DIAGNOSIS — R300 Dysuria: Secondary | ICD-10-CM

## 2023-08-22 DIAGNOSIS — N898 Other specified noninflammatory disorders of vagina: Secondary | ICD-10-CM | POA: Diagnosis not present

## 2023-08-22 LAB — POC URINALSYSI DIPSTICK (AUTOMATED)
Bilirubin, UA: NEGATIVE
Blood, UA: NEGATIVE
Glucose, UA: NEGATIVE
Ketones, UA: NEGATIVE
Leukocytes, UA: NEGATIVE
Nitrite, UA: NEGATIVE
Protein, UA: NEGATIVE
Spec Grav, UA: 1.01 (ref 1.010–1.025)
Urobilinogen, UA: 0.2 U/dL
pH, UA: 6 (ref 5.0–8.0)

## 2023-08-22 NOTE — Progress Notes (Signed)
 "    Rebecca Budge T. Kelani Robart, MD, CAQ Sports Medicine [place] [facility] [address] [place], [address]  Phone: [phone]  FAX: [phone]  Rebecca [name] - 68 y.o. female  MRN [id number]  Date of Birth: 1955-01-16  Date: 08/22/2023  PCP: Randeen Laine LABOR, MD  Referral: Randeen Laine LABOR, MD  Chief Complaint  Patient presents with   Urinary Urgency        Dysuria        Vaginal Discharge   Subjective:   Rebecca [name] is a 68 y.o. very pleasant female patient with Body mass index is 36.19 kg/m. who presents with the following:  Discussed the use of AI scribe software for clinical note transcription with the patient, who gave verbal consent to proceed.  History of Present Illness Rebecca [name] is a 68 year old female who presents with urinary symptoms including burning, urgency, and discharge.  She experiences burning and urgency during urination, which has become more noticeable recently. She feels the need to urinate more frequently than usual. There is no pain in the lower abdomen, but she does experience some discomfort in the lower back.  She notes an unusual discharge, which is yellowish and sometimes brownish in color. She is not sexually active and has not been for years, eliminating recent exposure to sexually transmitted diseases. She has been postmenopausal for several years.     Review of Systems is noted in the HPI, as appropriate  Objective:   BP 130/80   Pulse 64   Temp (!) 97.3 F (36.3 C) (Temporal)   Ht 5' 6.75 (1.695 m)   Wt 229 lb 6 oz (104 kg)   SpO2 99%   BMI 36.19 kg/m   GEN: WDWN HEENT: Atraumatc, normocephalic. CV: RRR, No M/G/R PULM: CTA B, No wheezes, crackles, or rhonchi ABD: S, NT, ND, +BS, no rebound. No CVAT. No suprapubic tenderness.   Physical Exam   Laboratory and Imaging Data:  Results   Assessment and Plan:     ICD-10-CM   1. Dysuria  R30.0 POCT Urinalysis Dipstick (Automated)     Urine Culture    2. Vaginal discharge  N89.8 WET PREP BY MOLECULAR PROBE    3. Essential hypertension  I10       Assessment and Plan Assessment & Plan Acute urinary tract infection with dysuria, urinary frequency, and lower back pain Symptoms indicate an acute urinary tract infection. - Order urinalysis and urine culture.  Abnormal vaginal discharge New abnormal discharge, unlikely STI due to lack of recent sexual activity. - Instructed self-swab for vaginal discharge. - Send sample to lab for analysis.  She also wonders if her HCTZ could be causing hair loss and would like to try a different diuretic and/or blood pressure medication.  I reviewed the literature, and chlorthalidone also causes potential hair loss, but in both cases it is uncommon.  I am going to involve her primary care doctor of 25 years for this issue.  Medications Discontinued During This Encounter  Medication Reason   fluticasone  (FLONASE ) 50 MCG/ACT nasal spray Completed Course   benzonatate  (TESSALON  PERLES) 100 MG capsule Completed Course   albuterol  (VENTOLIN  HFA) 108 (90 Base) MCG/ACT inhaler Completed Course   azithromycin  (ZITHROMAX ) 250 MG tablet Completed Course   benzonatate  (TESSALON  PERLES) 100 MG capsule Completed Course   azithromycin  (ZITHROMAX ) 250 MG tablet Completed Course   albuterol  (VENTOLIN  HFA) 108 (90 Base) MCG/ACT inhaler Completed Course    Orders placed today  for conditions managed today: Orders Placed This Encounter  Procedures   WET PREP BY MOLECULAR PROBE   Urine Culture   POCT Urinalysis Dipstick (Automated)    Disposition: No follow-ups on file.  [employer] speech-to-text software was used for transcription in this dictation.  Possible transcriptional errors can occur using Animal nutritionist.   Signed,  Jacques DASEN. Anayeli Arel, MD   Outpatient Encounter Medications as of 08/22/2023  Medication Sig   benazepril  (LOTENSIN ) 40 MG tablet Take 1 tablet (40 mg total) by  mouth daily.   cholecalciferol (VITAMIN D3) 25 MCG (1000 UNIT) tablet Take 1,000 Units by mouth daily.   diclofenac  (VOLTAREN ) 75 MG EC tablet Take 1 tablet (75 mg total) by mouth 2 (two) times daily. Bid prn pain   hydrochlorothiazide  (HYDRODIURIL ) 50 MG tablet Take 1 tablet (50 mg total) by mouth daily.   hydrocortisone  (ANUCORT-HC ) 25 MG suppository UNWRAP AND INSERT ONE SUPPOSITORY RECTALLY EVERY NIGHT AT BEDTIME AS NEEDED FOR HEMORRHOIDS   hydrocortisone  (ANUSOL -HC) 2.5 % rectal cream Apply to affected area on hemorrhoids once daily as needed   latanoprost (XALATAN) 0.005 % ophthalmic solution Place 1 drop into both eyes.    lidocaine  (XYLOCAINE ) 2 % jelly Apply 1 Application topically 3 (three) times daily as needed (rectum).   Multiple Vitamin (MULTIVITAMIN) tablet Take 1 tablet by mouth daily.     potassium chloride  (KLOR-CON ) 10 MEQ tablet Take 1 tablet (10 mEq total) by mouth daily.   traMADol  (ULTRAM ) 50 MG tablet Take 1 tablet (50 mg total) by mouth every 12 (twelve) hours as needed.   [DISCONTINUED] albuterol  (VENTOLIN  HFA) 108 (90 Base) MCG/ACT inhaler Inhale 2 puffs into the lungs every 6 (six) hours as needed for shortness of breath.   [DISCONTINUED] azithromycin  (ZITHROMAX ) 250 MG tablet Take 1 tablet (250 mg total) by mouth daily. Take first 2 tablets together, then 1 every day until finished.   [DISCONTINUED] benzonatate  (TESSALON  PERLES) 100 MG capsule Take 1-2 tabs TID prn cough (Patient not taking: Reported on 07/15/2023)   [DISCONTINUED] fluticasone  (FLONASE ) 50 MCG/ACT nasal spray Place 2 sprays into both nostrils daily as needed for allergies or rhinitis.   No facility-administered encounter medications on file as of 08/22/2023.   "

## 2023-08-22 NOTE — Patient Instructions (Signed)
 Rogaine with minoxidil for hair loss  Try Biotin supplement

## 2023-08-23 LAB — URINE CULTURE
MICRO NUMBER:: 16845630
Result:: NO GROWTH
SPECIMEN QUALITY:: ADEQUATE

## 2023-08-23 LAB — WET PREP BY MOLECULAR PROBE
Candida species: NOT DETECTED
Gardnerella vaginalis: NOT DETECTED
MICRO NUMBER:: 16845629
SPECIMEN QUALITY:: ADEQUATE
Trichomonas vaginosis: NOT DETECTED

## 2023-08-24 ENCOUNTER — Ambulatory Visit: Payer: Self-pay | Admitting: Family Medicine

## 2023-08-26 ENCOUNTER — Telehealth: Payer: Self-pay | Admitting: *Deleted

## 2023-08-26 NOTE — Telephone Encounter (Signed)
 PCP sent a message asking to have pt f/u with her after seeing Dr. Watt recently, please schedule f/u with PCP

## 2023-08-26 NOTE — Telephone Encounter (Signed)
 lvm for pt to call office to schedule appt.

## 2023-09-02 ENCOUNTER — Encounter: Payer: Self-pay | Admitting: Family Medicine

## 2023-09-02 ENCOUNTER — Ambulatory Visit
Admission: RE | Admit: 2023-09-02 | Discharge: 2023-09-02 | Disposition: A | Discharge status: Home / Self Care | Source: Ambulatory Visit | Attending: Family Medicine | Admitting: Family Medicine

## 2023-09-02 ENCOUNTER — Other Ambulatory Visit: Payer: Self-pay | Admitting: Family Medicine

## 2023-09-02 ENCOUNTER — Ambulatory Visit (INDEPENDENT_AMBULATORY_CARE_PROVIDER_SITE_OTHER): Admitting: Family Medicine

## 2023-09-02 VITALS — BP 122/65 | HR 65 | Temp 97.5°F | Ht 66.75 in | Wt 225.5 lb

## 2023-09-02 DIAGNOSIS — Z13820 Encounter for screening for osteoporosis: Secondary | ICD-10-CM

## 2023-09-02 DIAGNOSIS — I10 Essential (primary) hypertension: Secondary | ICD-10-CM | POA: Diagnosis not present

## 2023-09-02 DIAGNOSIS — R7303 Prediabetes: Secondary | ICD-10-CM

## 2023-09-02 DIAGNOSIS — E2839 Other primary ovarian failure: Secondary | ICD-10-CM | POA: Diagnosis not present

## 2023-09-02 DIAGNOSIS — E876 Hypokalemia: Secondary | ICD-10-CM

## 2023-09-02 DIAGNOSIS — Z1231 Encounter for screening mammogram for malignant neoplasm of breast: Secondary | ICD-10-CM | POA: Diagnosis not present

## 2023-09-02 DIAGNOSIS — L659 Nonscarring hair loss, unspecified: Secondary | ICD-10-CM

## 2023-09-02 LAB — POCT GLYCOSYLATED HEMOGLOBIN (HGB A1C): Hemoglobin A1C: 5.9 % — AB (ref 4.0–5.6)

## 2023-09-02 NOTE — Assessment & Plan Note (Signed)
 bp in fair control at this time  BP Readings from Last 1 Encounters:  09/02/23 122/65   No changes needed Most recent labs reviewed  Disc lifstyle change with low sodium diet and exercise   Continue Benazapril 40 mg daily  Hydrochlorothiazide  50 mg daily   Doubt hydrochlorothiazide  is causing hair loss More likely benazapril if anything  Pt has had hair loss for years-will refer to derm before changing any medicines

## 2023-09-02 NOTE — Assessment & Plan Note (Signed)
 Dexa ordered

## 2023-09-02 NOTE — Assessment & Plan Note (Signed)
 Pt requested dexa Last one was 2020   Order in She will schedule  No falls or fractures

## 2023-09-02 NOTE — Assessment & Plan Note (Signed)
 Prediabetes  Lab Results  Component Value Date   HGBA1C 5.9 (A) 09/02/2023   HGBA1C 6.0 (H) 05/20/2023   HGBA1C 5.8 (A) 05/06/2022    disc imp of low glycemic diet and wt loss to prevent DM2

## 2023-09-02 NOTE — Patient Instructions (Addendum)
 Keep walking  Add some strength training to your routine, this is important for bone and brain health and can reduce your risk of falls and help your body use insulin properly and regulate weight  Light weights, exercise bands , and internet videos are a good way to start  Yoga (chair or regular), machines , floor exercises or a gym with machines are also good options    I put the referral in for dermatology  Please let us  know if you don't hear in 1-2 weeks to set that up (mychart message or call or letter)   Continue your current medicines for now   Take care of yourself    I put the order in for DEXA You call and schedule     You have an order for:  []   3D Mammogram  [x]   Bone Density     Please call for appointment:   [x]   [facility]  [address] [place] [address]  [phone]  []   [facility])   [address]  [place], [place] [address]  [phone]  []   [facility]      [address] [place], [place]        [phone]         []   [facility]  [address] [place], [place]  [phone]  []  [facility]   [address]   [place], [place] [address]  [phone]  []  [facility]  [address] [place], [place] [address] [phone]    Make sure to wear two piece clothing  No lotions powders or deodorants the day of the appointment Make sure to bring picture ID and insurance card.  Bring list of medications you are currently taking including any supplements.   Schedule your screening mammogram through MyChart!   Select [place] imaging sites can now be scheduled through MyChart.  Log into your MyChart account.  Go to 'Visit' (or 'Appointments' if  on mobile App) --> Schedule an   Appointment  Under 'Select a Reason for Visit' choose the Mammogram  Screening option.  Complete the pre-visit questions  and select the time and place that  best fits your schedule

## 2023-09-02 NOTE — Assessment & Plan Note (Signed)
 Ongoing/ years  Waxes and wanes Per pt -mostly breakage of hair  Also some thinning -crown /posterior   On exam hair is fine Scalp appears healthy  No focal alopecia   Decided to refer to dermatology  Doubtful this is med side effect but could change ace to arb if her dermatologist thinks this is med related

## 2023-09-02 NOTE — Progress Notes (Signed)
 Subjective:    Patient ID: Rebecca [name], female    DOB: Sep 02, 1955, 68 y.o.   MRN: [id number]  HPI  Wt Readings from Last 3 Encounters:  09/02/23 225 lb 8 oz (102.3 kg)  08/22/23 229 lb 6 oz (104 kg)  05/27/23 222 lb (100.7 kg)   35.58 kg/m  Vitals:   09/02/23 0803 09/02/23 0828  BP: 132/84 122/65  Pulse: 65   Temp: (!) 97.5 F (36.4 C)   SpO2: 97%      Pt presents for follow up of HTN  Saw Dr Watt recently and noted that hydrochlorothiazide  may be causing hair loss   HTN Hair loss  Prediabetes  Needs order for dexa  (last 2020) for screen No falls or fracture    Hair is thinning out  Especially center of head  Hair seems to be breaking Not a lot of hair in sink   She did go natural for 1-2 years-did not help  Then went back to a relaxer   Uses an oil  Tries to avoid heat  Does not use dryers    No one in family has hair loss     HTN  No cp or palpitations or headaches or edema  BP Readings from Last 3 Encounters:  09/02/23 122/65  08/22/23 130/80  07/15/23 138/89    Benazepril  40 mg daily  Hydrochlorothiazide  50 mg daily   Lab Results  Component Value Date   NA 141 05/20/2023   K 4.1 05/20/2023   CO2 24 05/20/2023   GLUCOSE 106 (H) 05/20/2023   BUN 18 05/20/2023   CREATININE 0.71 05/20/2023   CALCIUM 9.3 05/20/2023   GFR 77.52 09/14/2022   EGFR 91 05/20/2023   GFRNONAA >60 08/03/2020   In past pt thought amlodipine  caused hair loss and she stopped it  Also toprol  xl  Is allergic to spironolactone    Lab Results  Component Value Date   IRON 85 08/23/2022    Lab Results  Component Value Date   TSH 2.090 05/20/2023   Lab Results  Component Value Date   VITAMINB12 1,316 (H) 08/23/2022   Lab Results  Component Value Date   ALT 16 05/20/2023   AST 16 05/20/2023   ALKPHOS 61 05/20/2023   BILITOT 0.4 05/20/2023   Lab Results  Component Value Date   WBC 3.4 05/20/2023   HGB 13.7 05/20/2023   HCT 40.9 05/20/2023    MCV 96 05/20/2023   PLT 287 05/20/2023    Prediabetes Lab Results  Component Value Date   HGBA1C 5.9 (A) 09/02/2023   HGBA1C 6.0 (H) 05/20/2023   HGBA1C 5.8 (A) 05/06/2022   Is working on diet and exercise  Weight is down 4 lb   Busy summer working at a camp  Active Started back walking this past 2 week      Patient Active Problem List   Diagnosis Date Noted   Screening for osteoporosis 09/02/2023   Leukopenia 09/14/2022   Hair loss 08/23/2022   Class 2 severe obesity due to excess calories with serious comorbidity and body mass index (BMI) of 35.0 to 35.9 in adult [facility]) 05/08/2022   Hepatic steatosis 01/05/2022   Crepitus of cervical spine 04/30/2021   Medicare annual wellness visit, subsequent 11/24/2020   Low back pain 10/14/2020   Insomnia 07/23/2019   Estrogen deficiency 11/27/2018   Sinusitis, chronic 03/12/2015   Fatigue 05/24/2014   Hyperlipidemia 10/30/2013   Family history of colon cancer requiring screening colonoscopy 09/14/2011  Routine general medical examination at a health care facility 09/05/2011   Mixed incontinence urge and stress 04/14/2011   Hemorrhoids, internal, with bleeding 10/12/2010   External hemorrhoids 11/04/2009   Allergic rhinitis 08/13/2009   Hypokalemia 04/28/2007   Prediabetes 11/15/2006   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   Herpes simplex virus (HSV) infection 07/06/2006   Essential hypertension 07/06/2006   URINARY INCONTINENCE, MIXED 07/06/2006   Past Medical History:  Diagnosis Date   Allergy    Cataract    beginnings of    Glaucoma    Heart murmur    noted on PCP exam 11/23/2012; does not appear urgent   Hemorrhoids    Hypertension    under control with meds., has been on med. > 20 yr.   Radius fracture 11/22/2012   left, comminuted   Past Surgical History:  Procedure Laterality Date   ABDOMINAL HYSTERECTOMY  06/02/2000   partial   COLONOSCOPY  03/2006   COLONOSCOPY WITH PROPOFOL   11/15/2011   ENDOMETRIAL  ABLATION     OPEN REDUCTION INTERNAL FIXATION (ORIF) DISTAL RADIAL FRACTURE Left 11/29/2012   Procedure: OPEN REDUCTION INTERNAL FIXATION (ORIF) LEFT  DISTAL RADIAL FRACTURE;  Surgeon: Arley JONELLE Curia, MD;  Location: [facility];  Service: Orthopedics;  Laterality: Left;   PARTIAL HYSTERECTOMY     Social History   Tobacco Use   Smoking status: Never   Smokeless tobacco: Never  Vaping Use   Vaping status: Never Used  Substance Use Topics   Alcohol use: No   Drug use: No   Family History  Problem Relation Age of Onset   Heart attack Mother    Heart disease Mother    Hypertension Mother        Died from a heart attack   Colon cancer Mother    Diabetes Mother    Dementia Mother    Heart disease Father    Hypertension Father        Dad a a triple bypass.   Coronary artery disease Father        with CABG   Dementia Father    Colon polyps Sister    Breast cancer Maternal Aunt    Diabetes Maternal Aunt    Colon cancer Maternal Uncle    Colon cancer Paternal Uncle    Esophageal cancer Neg Hx    Rectal cancer Neg Hx    Stomach cancer Neg Hx    Allergies  Allergen Reactions   Amoxicillin  Hives and Swelling   Spironolactone Diarrhea   Augmentin  [Amoxicillin -Pot Clavulanate] Diarrhea    Tore stomach up   Current Outpatient Medications on File Prior to Visit  Medication Sig Dispense Refill   benazepril  (LOTENSIN ) 40 MG tablet Take 1 tablet (40 mg total) by mouth daily. 90 tablet 3   cholecalciferol (VITAMIN D3) 25 MCG (1000 UNIT) tablet Take 1,000 Units by mouth daily.     diclofenac  (VOLTAREN ) 75 MG EC tablet Take 1 tablet (75 mg total) by mouth 2 (two) times daily. Bid prn pain 60 tablet 2   hydrochlorothiazide  (HYDRODIURIL ) 50 MG tablet Take 1 tablet (50 mg total) by mouth daily. 90 tablet 3   hydrocortisone  (ANUCORT-HC ) 25 MG suppository UNWRAP AND INSERT ONE SUPPOSITORY RECTALLY EVERY NIGHT AT BEDTIME AS NEEDED FOR HEMORRHOIDS 12 suppository 1   hydrocortisone   (ANUSOL -HC) 2.5 % rectal cream Apply to affected area on hemorrhoids once daily as needed 30 g 0   latanoprost (XALATAN) 0.005 % ophthalmic solution Place 1 drop into both  eyes.      lidocaine  (XYLOCAINE ) 2 % jelly Apply 1 Application topically 3 (three) times daily as needed (rectum). 30 mL 1   Multiple Vitamin (MULTIVITAMIN) tablet Take 1 tablet by mouth daily.       potassium chloride  (KLOR-CON ) 10 MEQ tablet Take 1 tablet (10 mEq total) by mouth daily. 90 tablet 1   traMADol  (ULTRAM ) 50 MG tablet Take 1 tablet (50 mg total) by mouth every 12 (twelve) hours as needed. 60 tablet 2   No current facility-administered medications on file prior to visit.    Review of Systems  Constitutional:  Negative for activity change, appetite change, fatigue, fever and unexpected weight change.  HENT:  Negative for congestion, ear pain, rhinorrhea, sinus pressure and sore throat.   Eyes:  Negative for pain, redness and visual disturbance.  Respiratory:  Negative for cough, shortness of breath and wheezing.   Cardiovascular:  Negative for chest pain and palpitations.  Gastrointestinal:  Negative for abdominal pain, blood in stool, constipation and diarrhea.  Endocrine: Negative for polydipsia and polyuria.  Genitourinary:  Negative for dysuria, frequency and urgency.  Musculoskeletal:  Negative for arthralgias, back pain and myalgias.  Skin:  Negative for pallor and rash.       Hair loss   Allergic/Immunologic: Negative for environmental allergies.  Neurological:  Negative for dizziness, syncope and headaches.  Hematological:  Negative for adenopathy. Does not bruise/bleed easily.  Psychiatric/Behavioral:  Negative for decreased concentration and dysphoric mood. The patient is not nervous/anxious.        Objective:   Physical Exam Constitutional:      General: She is not in acute distress.    Appearance: Normal appearance. She is well-developed. She is obese. She is not ill-appearing or  diaphoretic.  HENT:     Head: Normocephalic and atraumatic.  Eyes:     Conjunctiva/sclera: Conjunctivae normal.     Pupils: Pupils are equal, round, and reactive to light.  Neck:     Thyroid : No thyromegaly.     Vascular: No carotid bruit or JVD.  Cardiovascular:     Rate and Rhythm: Normal rate and regular rhythm.     Heart sounds: Normal heart sounds.     No gallop.  Pulmonary:     Effort: Pulmonary effort is normal. No respiratory distress.     Breath sounds: Normal breath sounds. No wheezing or rales.  Abdominal:     General: There is no distension or abdominal bruit.     Palpations: Abdomen is soft.  Musculoskeletal:     Cervical back: Normal range of motion and neck supple.     Right lower leg: No edema.     Left lower leg: No edema.  Lymphadenopathy:     Cervical: No cervical adenopathy.  Skin:    General: Skin is warm and dry.     Coloration: Skin is not pale.     Findings: No rash.     Comments: Scalp appears healthy  Hair is fine / has been straightened  No focal alopecia  Thinner growth in posterior crown No obvious breakage    Neurological:     Mental Status: She is alert.     Coordination: Coordination normal.     Deep Tendon Reflexes: Reflexes are normal and symmetric. Reflexes normal.  Psychiatric:        Mood and Affect: Mood normal.           Assessment & Plan:   Problem List Items Addressed This Visit  Cardiovascular and Mediastinum   Essential hypertension   bp in fair control at this time  BP Readings from Last 1 Encounters:  09/02/23 122/65   No changes needed Most recent labs reviewed  Disc lifstyle change with low sodium diet and exercise   Continue Benazapril 40 mg daily  Hydrochlorothiazide  50 mg daily   Doubt hydrochlorothiazide  is causing hair loss More likely benazapril if anything  Pt has had hair loss for years-will refer to derm before changing any medicines          Other   Screening for osteoporosis    Pt requested dexa Last one was 2020   Order in She will schedule  No falls or fractures      Prediabetes   Prediabetes  Lab Results  Component Value Date   HGBA1C 5.9 (A) 09/02/2023   HGBA1C 6.0 (H) 05/20/2023   HGBA1C 5.8 (A) 05/06/2022    disc imp of low glycemic diet and wt loss to prevent DM2        Relevant Orders   POCT HgB A1C (Completed)   Hair loss - Primary   Ongoing/ years  Waxes and wanes Per pt -mostly breakage of hair  Also some thinning -crown /posterior   On exam hair is fine Scalp appears healthy  No focal alopecia   Decided to refer to dermatology  Doubtful this is med side effect but could change ace to arb if her dermatologist thinks this is med related        Relevant Orders   Ambulatory referral to Dermatology   Estrogen deficiency   Dexa ordered       Relevant Orders   DG Bone Density

## 2023-09-08 ENCOUNTER — Ambulatory Visit: Payer: Self-pay | Admitting: Family Medicine

## 2023-10-03 ENCOUNTER — Ambulatory Visit
Admission: RE | Admit: 2023-10-03 | Discharge: 2023-10-03 | Disposition: A | Discharge status: Home / Self Care | Source: Ambulatory Visit | Attending: Family Medicine | Admitting: Family Medicine

## 2023-10-03 ENCOUNTER — Ambulatory Visit: Payer: Self-pay | Admitting: Family Medicine

## 2023-10-03 DIAGNOSIS — E2839 Other primary ovarian failure: Secondary | ICD-10-CM | POA: Diagnosis present

## 2023-10-03 DIAGNOSIS — M8580 Other specified disorders of bone density and structure, unspecified site: Secondary | ICD-10-CM | POA: Insufficient documentation

## 2023-10-03 DIAGNOSIS — Z780 Asymptomatic menopausal state: Secondary | ICD-10-CM | POA: Diagnosis not present

## 2023-10-03 DIAGNOSIS — M85859 Other specified disorders of bone density and structure, unspecified thigh: Secondary | ICD-10-CM

## 2023-10-03 DIAGNOSIS — M85851 Other specified disorders of bone density and structure, right thigh: Secondary | ICD-10-CM | POA: Diagnosis not present

## 2023-10-08 ENCOUNTER — Other Ambulatory Visit: Payer: Self-pay | Admitting: Family Medicine

## 2023-10-10 ENCOUNTER — Ambulatory Visit (INDEPENDENT_AMBULATORY_CARE_PROVIDER_SITE_OTHER)

## 2023-10-10 VITALS — BP 122/65 | Ht 66.5 in | Wt 227.0 lb

## 2023-10-10 DIAGNOSIS — Z0000 Encounter for general adult medical examination without abnormal findings: Secondary | ICD-10-CM

## 2023-10-10 NOTE — Patient Instructions (Signed)
 Rebecca [name],  Thank you for taking the time for your Medicare Wellness Visit. I appreciate your continued commitment to your health goals. Please review the care plan we discussed, and feel free to reach out if I can assist you further.  Medicare recommends these wellness visits once per year to help you and your care team stay ahead of potential health issues. These visits are designed to focus on prevention, allowing your provider to concentrate on managing your acute and chronic conditions during your regular appointments.  Please note that Annual Wellness Visits do not include a physical exam. Some assessments may be limited, especially if the visit was conducted virtually. If needed, we may recommend a separate in-person follow-up with your provider.  Ongoing Care Seeing your primary care provider every 3 to 6 months helps us  monitor your health and provide consistent, personalized care.   Referrals If a referral was made during today's visit and you haven't received any updates within two weeks, please contact the referred provider directly to check on the status.  Recommended Screenings:  Health Maintenance  Topic Date Due   Hepatitis C Screening  Never done   Medicare Annual Wellness Visit  08/04/2023   COVID-19 Vaccine (8 - 2025-26 season) 09/05/2023   Flu Shot  04/03/2024*   DTaP/Tdap/Td vaccine (3 - Td or Tdap) 05/26/2024*   Breast Cancer Screening  09/01/2025   Colon Cancer Screening  07/14/2026   Pneumococcal Vaccine for age over 42  Completed   DEXA scan (bone density measurement)  Completed   Zoster (Shingles) Vaccine  Completed   Meningitis B Vaccine  Aged Out  *Topic was postponed. The date shown is not the original due date.       10/10/2023    1:45 PM  Advanced Directives  Does Patient Have a Medical Advance Directive? Yes  Type of Estate agent of [place];Living will  Does patient want to make changes to medical advance directive? No -  Patient declined  Copy of Healthcare Power of Attorney in Chart? No - copy requested   Advance Care Planning is important because it: Ensures you receive medical care that aligns with your values, goals, and preferences. Provides guidance to your family and loved ones, reducing the emotional burden of decision-making during critical moments.  Vision: Annual vision screenings are recommended for early detection of glaucoma, cataracts, and diabetic retinopathy. These exams can also reveal signs of chronic conditions such as diabetes and high blood pressure.  Dental: Annual dental screenings help detect early signs of oral cancer, gum disease, and other conditions linked to overall health, including heart disease and diabetes.  Please see the attached documents for additional preventive care recommendations.

## 2023-10-10 NOTE — Progress Notes (Signed)
 Because this visit was a virtual/telehealth visit,  certain criteria was not obtained, such a blood pressure, CBG if applicable, and timed get up and go. Any medications not marked as taking were not mentioned during the medication reconciliation part of the visit. Any vitals not documented were not able to be obtained due to this being a telehealth visit or patient was unable to self-report a recent blood pressure reading due to a lack of equipment at home via telehealth. Vitals that have been documented are verbally provided by the patient.   This visit was performed by a medical professional under my direct supervision. I was immediately available for consultation/collaboration. I have reviewed and agree with the Annual Wellness Visit documentation.  Subjective:   Rebecca [name] is a 68 y.o. who presents for a Medicare Wellness preventive visit.  As a reminder, Annual Wellness Visits don't include a physical exam, and some assessments may be limited, especially if this visit is performed virtually. We may recommend an in-person follow-up visit with your provider if needed.  Visit Complete: Virtual I connected with  Rebecca [name] on 10/10/23 by a audio enabled telemedicine application and verified that I am speaking with the correct person using two identifiers.  Patient Location: Home  Provider Location: Home Office  I discussed the limitations of evaluation and management by telemedicine. The patient expressed understanding and agreed to proceed.  Vital Signs: Because this visit was a virtual/telehealth visit, some criteria may be missing or patient reported. Any vitals not documented were not able to be obtained and vitals that have been documented are patient reported.  VideoDeclined- This patient declined Librarian, academic. Therefore the visit was completed with audio only.  Persons Participating in Visit: Patient.  AWV Questionnaire: No: Patient Medicare  AWV questionnaire was not completed prior to this visit.  Cardiac Risk Factors include: advanced age (>68men, >104 women);obesity (BMI >30kg/m2);dyslipidemia;hypertension     Objective:    Today's Vitals   10/10/23 1345  BP: 122/65  Weight: 227 lb (103 kg)  Height: 5' 6.5 (1.689 m)   Body mass index is 36.09 kg/m.     10/10/2023    1:45 PM 07/15/2023    5:51 PM 02/24/2023   10:31 AM 08/04/2022    9:19 AM 09/21/2021    3:44 PM 08/03/2020   12:21 AM 11/23/2016   10:06 AM  Advanced Directives  Does Patient Have a Medical Advance Directive? Yes Yes No Yes No No No   Type of Estate agent of [place];Living will Healthcare Power of [place];Living will  Healthcare Power of [place];Living will     Does patient want to make changes to medical advance directive? No - Patient declined        Copy of Healthcare Power of Attorney in Chart? No - copy requested   No - copy requested     Would patient like information on creating a medical advance directive?     Yes (MAU/Ambulatory/Procedural Areas - Information given) Yes (ED - Information included in AVS)      Data saved with a previous flowsheet row definition    Current Medications (verified) Outpatient Encounter Medications as of 10/10/2023  Medication Sig   benazepril  (LOTENSIN ) 40 MG tablet Take 1 tablet (40 mg total) by mouth daily.   cholecalciferol (VITAMIN D3) 25 MCG (1000 UNIT) tablet Take 1,000 Units by mouth daily.   diclofenac  (VOLTAREN ) 75 MG EC tablet Take 1 tablet (75 mg total) by mouth 2 (two) times daily.  Bid prn pain   hydrochlorothiazide  (HYDRODIURIL ) 50 MG tablet Take 1 tablet (50 mg total) by mouth daily.   hydrocortisone  (ANUCORT-HC ) 25 MG suppository UNWRAP AND INSERT ONE SUPPOSITORY RECTALLY EVERY NIGHT AT BEDTIME AS NEEDED FOR HEMORRHOIDS   hydrocortisone  (ANUSOL -HC) 2.5 % rectal cream Apply to affected area on hemorrhoids once daily as needed   latanoprost (XALATAN) 0.005 % ophthalmic solution  Place 1 drop into both eyes.    lidocaine  (XYLOCAINE ) 2 % jelly Apply 1 Application topically 3 (three) times daily as needed (rectum).   Multiple Vitamin (MULTIVITAMIN) tablet Take 1 tablet by mouth daily.     potassium chloride  (KLOR-CON ) 10 MEQ tablet TAKE 1 TABLET BY MOUTH EVERY DAY   traMADol  (ULTRAM ) 50 MG tablet Take 1 tablet (50 mg total) by mouth every 12 (twelve) hours as needed.   No facility-administered encounter medications on file as of 10/10/2023.    Allergies (verified) Amoxicillin , Spironolactone, and Augmentin  [amoxicillin -pot clavulanate]   History: Past Medical History:  Diagnosis Date   Allergy    Cataract    beginnings of    Glaucoma    Heart murmur    noted on PCP exam 11/23/2012; does not appear urgent   Hemorrhoids    Hypertension    under control with meds., has been on med. > 20 yr.   Radius fracture 11/22/2012   left, comminuted   Past Surgical History:  Procedure Laterality Date   ABDOMINAL HYSTERECTOMY  06/02/2000   partial   COLONOSCOPY  03/2006   COLONOSCOPY WITH PROPOFOL   11/15/2011   ENDOMETRIAL ABLATION     OPEN REDUCTION INTERNAL FIXATION (ORIF) DISTAL RADIAL FRACTURE Left 11/29/2012   Procedure: OPEN REDUCTION INTERNAL FIXATION (ORIF) LEFT  DISTAL RADIAL FRACTURE;  Surgeon: Arley JONELLE Curia, MD;  Location: [facility];  Service: Orthopedics;  Laterality: Left;   PARTIAL HYSTERECTOMY     Family History  Problem Relation Age of Onset   Heart attack Mother    Heart disease Mother    Hypertension Mother        Died from a heart attack   Colon cancer Mother    Diabetes Mother    Dementia Mother    Heart disease Father    Hypertension Father        Dad a a triple bypass.   Coronary artery disease Father        with CABG   Dementia Father    Colon polyps Sister    Breast cancer Maternal Aunt    Diabetes Maternal Aunt    Colon cancer Maternal Uncle    Colon cancer Paternal Uncle    Esophageal cancer Neg Hx    Rectal  cancer Neg Hx    Stomach cancer Neg Hx    Social History   Socioeconomic History   Marital status: Divorced    Spouse name: Not on file   Number of children: 2   Years of education: Not on file   Highest education level: Master's degree (e.g., MA, MS, MEng, MEd, MSW, MBA)  Occupational History   Occupation: Engineer, site, also going to school for teaching degree, 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   Occupation: retired  Tobacco Use   Smoking status: Never   Smokeless tobacco: Never  Vaping Use   Vaping status: Never Used  Substance and Sexual Activity   Alcohol use: No   Drug use: No   Sexual activity: Not on file  Other Topics Concern   Not on file  Social History Narrative  Regular exercise   Social Drivers of Health   Financial Resource Strain: Low Risk  (10/10/2023)   Overall Financial Resource Strain (CARDIA)    Difficulty of Paying Living Expenses: Not hard at all  Food Insecurity: No Food Insecurity (10/10/2023)   Hunger Vital Sign    Worried About Running Out of Food in the Last Year: Never true    Ran Out of Food in the Last Year: Never true  Transportation Needs: No Transportation Needs (10/10/2023)   PRAPARE - Administrator, Civil Service (Medical): No    Lack of Transportation (Non-Medical): No  Physical Activity: Sufficiently Active (10/10/2023)   Exercise Vital Sign    Days of Exercise per Week: 3 days    Minutes of Exercise per Session: 60 min  Stress: No Stress Concern Present (10/10/2023)   [employer] of Occupational Health - Occupational Stress Questionnaire    Feeling of Stress: Not at all  Social Connections: Moderately Integrated (10/10/2023)   Social Connection and Isolation Panel    Frequency of Communication with Friends and Family: More than three times a week    Frequency of Social Gatherings with Friends and Family: More than three times a week    Attends Religious Services: More than 4 times per year    Active Member of [employer] or  Organizations: Yes    Attends Engineer, structural: More than 4 times per year    Marital Status: Divorced    Tobacco Counseling Counseling given: Not Answered    Clinical Intake:  Pre-visit preparation completed: Yes  Pain : No/denies pain     BMI - recorded: 36.09 Nutritional Status: BMI > 30  Obese Nutritional Risks: None Diabetes: No  Lab Results  Component Value Date   HGBA1C 5.9 (A) 09/02/2023   HGBA1C 6.0 (H) 05/20/2023   HGBA1C 5.8 (A) 05/06/2022     How often do you need to have someone help you when you read instructions, pamphlets, or other written materials from your doctor or pharmacy?: 1 - Never  Interpreter Needed?: No  Information entered by :: Isaiha Asare,CMA   Activities of Daily Living     10/10/2023    1:48 PM  In your present state of health, do you have any difficulty performing the following activities:  Hearing? 0  Vision? 0  Difficulty concentrating or making decisions? 0  Walking or climbing stairs? 0  Dressing or bathing? 0  Doing errands, shopping? 0  Preparing Food and eating ? N  Using the Toilet? N  In the past six months, have you accidently leaked urine? N  Do you have problems with loss of bowel control? N  Managing your Medications? N  Managing your Finances? N  Housekeeping or managing your Housekeeping? N    Patient Care Team: Tower, Laine LABOR, MD as PCP - General (Family Medicine) Darliss Rogue, MD as PCP - Cardiology (Cardiology)  I have updated your Care Teams any recent Medical Services you may have received from other providers in the past year.     Assessment:   This is a routine wellness examination for Rebecca [name].  Hearing/Vision screen Hearing Screening - Comments:: No difficulties  Vision Screening - Comments:: Patient wears glasses    Goals Addressed             This Visit's Progress    Patient Stated   On track    Lose weight and make smart food choices.       Depression  Screen     10/10/2023    1:50 PM 09/02/2023    8:13 AM 05/27/2023    8:22 AM 03/09/2023    3:13 PM 09/14/2022    2:34 PM 08/23/2022    8:30 AM 08/04/2022    9:18 AM  PHQ 2/9 Scores  PHQ - 2 Score 0 0 0 0 0 0 0  PHQ- 9 Score 0 0 5 0 1 0 0    Fall Risk     10/10/2023    1:47 PM 09/02/2023    8:13 AM 05/27/2023    8:23 AM 03/09/2023    3:13 PM 09/14/2022    2:34 PM  Fall Risk   Falls in the past year? 0 0 0 0 0  Number falls in past yr: 0 0 0 0 0  Injury with Fall? 0 0 0 0 0  Risk for fall due to : No Fall Risks No Fall Risks No Fall Risks No Fall Risks No Fall Risks  Follow up Falls evaluation completed Falls evaluation completed Falls evaluation completed Falls evaluation completed Falls evaluation completed    MEDICARE RISK AT HOME:  Medicare Risk at Home Any stairs in or around the home?: No If so, are there any without handrails?: No Home free of loose throw rugs in walkways, pet beds, electrical cords, etc?: Yes Adequate lighting in your home to reduce risk of falls?: Yes Life alert?: No Use of a cane, walker or w/c?: No Grab bars in the bathroom?: Yes Shower chair or bench in shower?: No Elevated toilet seat or a handicapped toilet?: Yes  TIMED UP AND GO:  Was the test performed?  No  Cognitive Function: 6CIT completed        10/10/2023    1:50 PM 08/04/2022    9:22 AM  6CIT Screen  What Year? 0 points 0 points  What month? 0 points 0 points  What time? 0 points 0 points  Count back from 20 0 points 0 points  Months in reverse 0 points 0 points  Repeat phrase 0 points 2 points  Total Score 0 points 2 points    Immunizations Immunization History  Administered Date(s) Administered   Fluad Quad(high Dose 65+) 10/10/2018, 12/03/2019, 11/24/2020   Fluad Trivalent(High Dose 65+) 03/09/2023   Influenza,inj,Quad PF,6+ Mos 11/20/2012, 10/30/2013, 03/12/2015, 12/16/2015, 09/14/2016, 11/25/2017   Influenza-Unspecified 10/03/2021   PFIZER Comirnaty(Gray Top)Covid-19  Tri-Sucrose Vaccine 05/19/2020   PFIZER(Purple Top)SARS-COV-2 Vaccination 03/30/2019, 04/24/2019, 12/01/2019   PPD Test 11/02/2016   Pfizer Covid-19 Vaccine Bivalent Booster 13yrs & up 12/19/2020   Pfizer(Comirnaty)Fall Seasonal Vaccine 12 years and older 03/19/2022   Pneumococcal Conjugate-13 11/27/2018   Pneumococcal Polysaccharide-23 12/19/2019   Pneumococcal-Unspecified 10/06/2020   Respiratory Syncytial Virus Vaccine,Recomb Aduvanted(Arexvy) 03/18/2022   Td 01/03/2003   Tdap 11/20/2012   Unspecified SARS-COV-2 Vaccination 10/06/2020   Zoster Recombinant(Shingrix) 12/19/2020, 02/25/2021    Screening Tests Health Maintenance  Topic Date Due   Hepatitis C Screening  Never done   COVID-19 Vaccine (8 - 2025-26 season) 09/05/2023   Influenza Vaccine  04/03/2024 (Originally 08/05/2023)   DTaP/Tdap/Td (3 - Td or Tdap) 05/26/2024 (Originally 11/21/2022)   Medicare Annual Wellness (AWV)  10/09/2024   Mammogram  09/01/2025   Colonoscopy  07/14/2026   Pneumococcal Vaccine: 50+ Years  Completed   DEXA SCAN  Completed   Zoster Vaccines- Shingrix  Completed   Meningococcal B Vaccine  Aged Out    Health Maintenance Items Addressed:patient declined   Additional Screening:  Vision Screening: Recommended  annual ophthalmology exams for early detection of glaucoma and other disorders of the eye. Is the patient up to date with their annual eye exam?  Yes    Dental Screening: Recommended annual dental exams for proper oral hygiene  Community Resource Referral / Chronic Care Management: CRR required this visit?  No   CCM required this visit?  No   Plan:    I have personally reviewed and noted the following in the patient's chart:   Medical and social history Use of alcohol, tobacco or illicit drugs  Current medications and supplements including opioid prescriptions. Patient is not currently taking opioid prescriptions. Functional ability and status Nutritional status Physical  activity Advanced directives List of other physicians Hospitalizations, surgeries, and ER visits in previous 12 months Vitals Screenings to include cognitive, depression, and falls Referrals and appointments  In addition, I have reviewed and discussed with patient certain preventive protocols, quality metrics, and best practice recommendations. A written personalized care plan for preventive services as well as general preventive health recommendations were provided to patient.   Lyle MARLA Right, [place]   10/10/2023   After Visit Summary: (MyChart) Due to this being a telephonic visit, the after visit summary with patients personalized plan was offered to patient via MyChart   Notes: Nothing significant to report at this time.

## 2023-10-17 ENCOUNTER — Ambulatory Visit: Admitting: Family Medicine

## 2023-10-24 ENCOUNTER — Ambulatory Visit: Admitting: Family Medicine

## 2023-10-24 ENCOUNTER — Ambulatory Visit (INDEPENDENT_AMBULATORY_CARE_PROVIDER_SITE_OTHER)
Admission: RE | Admit: 2023-10-24 | Discharge: 2023-10-24 | Disposition: A | Source: Ambulatory Visit | Attending: Family Medicine | Admitting: Family Medicine

## 2023-10-24 ENCOUNTER — Encounter: Payer: Self-pay | Admitting: Family Medicine

## 2023-10-24 ENCOUNTER — Ambulatory Visit: Payer: Self-pay | Admitting: Family Medicine

## 2023-10-24 VITALS — BP 144/80 | HR 70 | Temp 97.7°F | Ht 66.5 in | Wt 226.0 lb

## 2023-10-24 DIAGNOSIS — M79661 Pain in right lower leg: Secondary | ICD-10-CM

## 2023-10-24 DIAGNOSIS — G8929 Other chronic pain: Secondary | ICD-10-CM | POA: Diagnosis not present

## 2023-10-24 DIAGNOSIS — M545 Low back pain, unspecified: Secondary | ICD-10-CM

## 2023-10-24 DIAGNOSIS — Z23 Encounter for immunization: Secondary | ICD-10-CM | POA: Diagnosis not present

## 2023-10-24 DIAGNOSIS — I10 Essential (primary) hypertension: Secondary | ICD-10-CM

## 2023-10-24 DIAGNOSIS — M47817 Spondylosis without myelopathy or radiculopathy, lumbosacral region: Secondary | ICD-10-CM | POA: Diagnosis not present

## 2023-10-24 DIAGNOSIS — M51379 Other intervertebral disc degeneration, lumbosacral region without mention of lumbar back pain or lower extremity pain: Secondary | ICD-10-CM | POA: Diagnosis not present

## 2023-10-24 NOTE — Assessment & Plan Note (Signed)
 Pt stopped benazepril  thinking it could cause her leg pain  I do not think that is the case Encouraged pt to go back to benazepril  40 mg daily with hydrochlorothiazide  50 mg daily  These worked better than amlodipine   BP: (!) 144/80

## 2023-10-24 NOTE — Assessment & Plan Note (Signed)
 Midline/ stiffness and pressure intermittently  Do wonder if pt's right lateral lower leg pain (fleeting/tingly) is radicular   LS xr today  Encouraged use of heat  Given handout for sciatica rehab

## 2023-10-24 NOTE — Patient Instructions (Addendum)
 Stop the amlodipine  and start back on benazepril    Xray of Lumbar spine today  I wonder if you have a pinched nerve causing pain   We will reach out with results and plan   Use gentle heat on your back for 10 minutes at a time   Take a look at the rehab exercises also-try them  Change your sleep position  Consider a new mattress if needed   Flu shot today

## 2023-10-24 NOTE — Progress Notes (Signed)
 Subjective:    Patient ID: Rebecca [name], female    DOB: [birth year], 68 y.o.   MRN: [id number]  HPI  Wt Readings from Last 3 Encounters:  10/24/23 226 lb (102.5 kg)  10/10/23 227 lb (103 kg)  09/02/23 225 lb 8 oz (102.3 kg)   35.93 kg/m  Vitals:   10/24/23 1603  BP: (!) 144/80  Pulse: 70  Temp: 97.7 F (36.5 C)  SpO2: 98%    Pt presents for c/o Sharp pain in leg (right)   HTN-stopped her benazepril  from cardiology  Started back on old amlodipine  prescription   Funny pain in right lower leg (lateral just under knee)  Hurts- gets her attention  Also occational in right forearm Fleeting  Sharp and tingling  Only lasts a second  Often when lying down at night or in the am  (rolls over and it will improve) Not when moving around  At least 5-6 times  Feels better to rub it   Knee is ok   No swelling   No weakness in arm or leg  No foot drop   Back bothered her last week  Low back - more tightness than pain In the middle  Does not feel like ot radiates to leg   Exercise Walking [employer] gym- needs to be better    Imaging of LS today   IMPRESSION: 1. No acute abnormality of the lumbar spine. 2. Moderate to advanced degenerative disc height loss at L5-S1 and mild multilevel disc height loss elsewhere within the lumbar spine, progressed since the prior exam.      Has history of unilateral primary OA left knee Went to ortho care in May and had an injection and was prescription tramadol  Saw PA Stanbery  Imaging Imaging: XR KNEE 3 VIEW LEFT Result Date: 05/10/2023 X-rays demonstrate advanced degenerative changes to the medial and patellofemoral compartments  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   Hair loss 08/23/2022   Class 2 severe obesity due to excess calories with serious comorbidity and body mass index (BMI) of 35.0 to 35.9 in adult  05/08/2022   Hepatic steatosis 01/05/2022   Crepitus of cervical spine 04/30/2021   Medicare annual wellness visit, subsequent 11/24/2020   Low back pain 10/14/2020   Insomnia 07/23/2019   Estrogen deficiency 11/27/2018   Sinusitis, chronic 03/12/2015   Fatigue 05/24/2014   Hyperlipidemia 10/30/2013   Lower leg pain 03/27/2012   Family history of colon cancer requiring screening colonoscopy 09/14/2011   Routine general medical examination at a health care facility 09/05/2011   Mixed incontinence urge and stress 04/14/2011   Hemorrhoids, internal, with bleeding 10/12/2010   External hemorrhoids 11/04/2009   Allergic rhinitis 08/13/2009   Hypokalemia 04/28/2007   Prediabetes 11/15/2006   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   Herpes simplex virus (HSV) infection 07/06/2006   Essential hypertension 07/06/2006   URINARY INCONTINENCE, MIXED 07/06/2006   Past Medical History:  Diagnosis Date   Allergy    Cataract    beginnings of    Glaucoma    Heart murmur    noted on PCP exam 11/23/2012; does not appear urgent   Hemorrhoids    Hypertension    under control with meds., has been on med. > 20 yr.   Radius fracture 11/22/2012   left, comminuted   Past Surgical History:  Procedure Laterality Date   ABDOMINAL HYSTERECTOMY  06/02/2000  partial   COLONOSCOPY  03/2006   COLONOSCOPY WITH PROPOFOL   11/15/2011   ENDOMETRIAL ABLATION     OPEN REDUCTION INTERNAL FIXATION (ORIF) DISTAL RADIAL FRACTURE Left 11/29/2012   Procedure: OPEN REDUCTION INTERNAL FIXATION (ORIF) LEFT  DISTAL RADIAL FRACTURE;  Surgeon: Arley JONELLE Curia, MD;  Location: [facility];  Service: Orthopedics;  Laterality: Left;   PARTIAL HYSTERECTOMY     Social History   Tobacco Use   Smoking status: Never   Smokeless tobacco: Never  Vaping Use   Vaping status: Never Used  Substance Use Topics   Alcohol use: No   Drug use: No   Family History  Problem Relation Age of Onset   Heart attack Mother     Heart disease Mother    Hypertension Mother        Died from a heart attack   Colon cancer Mother    Diabetes Mother    Dementia Mother    Heart disease Father    Hypertension Father        Dad a a triple bypass.   Coronary artery disease Father        with CABG   Dementia Father    Colon polyps Sister    Breast cancer Maternal Aunt    Diabetes Maternal Aunt    Colon cancer Maternal Uncle    Colon cancer Paternal Uncle    Esophageal cancer Neg Hx    Rectal cancer Neg Hx    Stomach cancer Neg Hx    Allergies  Allergen Reactions   Amoxicillin  Hives and Swelling   Spironolactone Diarrhea   Augmentin  [Amoxicillin -Pot Clavulanate] Diarrhea    Tore stomach up   Current Outpatient Medications on File Prior to Visit  Medication Sig Dispense Refill   cholecalciferol (VITAMIN D3) 25 MCG (1000 UNIT) tablet Take 1,000 Units by mouth daily.     diclofenac  (VOLTAREN ) 75 MG EC tablet Take 1 tablet (75 mg total) by mouth 2 (two) times daily. Bid prn pain 60 tablet 2   hydrochlorothiazide  (HYDRODIURIL ) 50 MG tablet TAKE 1 TABLET BY MOUTH EVERY DAY 90 tablet 2   hydrocortisone  (ANUCORT-HC ) 25 MG suppository UNWRAP AND INSERT ONE SUPPOSITORY RECTALLY EVERY NIGHT AT BEDTIME AS NEEDED FOR HEMORRHOIDS 12 suppository 1   hydrocortisone  (ANUSOL -HC) 2.5 % rectal cream Apply to affected area on hemorrhoids once daily as needed 30 g 0   latanoprost (XALATAN) 0.005 % ophthalmic solution Place 1 drop into both eyes.      lidocaine  (XYLOCAINE ) 2 % jelly Apply 1 Application topically 3 (three) times daily as needed (rectum). 30 mL 1   Multiple Vitamin (MULTIVITAMIN) tablet Take 1 tablet by mouth daily.       potassium chloride  (KLOR-CON ) 10 MEQ tablet TAKE 1 TABLET BY MOUTH EVERY DAY 90 tablet 1   traMADol  (ULTRAM ) 50 MG tablet Take 1 tablet (50 mg total) by mouth every 12 (twelve) hours as needed. 60 tablet 2   benazepril  (LOTENSIN ) 40 MG tablet Take 1 tablet (40 mg total) by mouth daily. (Patient not  taking: Reported on 10/24/2023) 90 tablet 3   No current facility-administered medications on file prior to visit.    Review of Systems  Constitutional:  Negative for activity change, appetite change, fatigue, fever and unexpected weight change.  HENT:  Negative for congestion, ear pain, rhinorrhea, sinus pressure and sore throat.   Eyes:  Negative for pain, redness and visual disturbance.  Respiratory:  Negative for cough, shortness of breath and wheezing.  Cardiovascular:  Negative for chest pain and palpitations.  Gastrointestinal:  Negative for abdominal pain, blood in stool, constipation and diarrhea.  Endocrine: Negative for polydipsia and polyuria.  Genitourinary:  Negative for dysuria, frequency and urgency.  Musculoskeletal:  Positive for back pain. Negative for arthralgias and myalgias.       Right leg pain   Skin:  Negative for pallor and rash.  Allergic/Immunologic: Negative for environmental allergies.  Neurological:  Negative for dizziness, syncope, numbness and headaches.       No numbness   Painful area on RLE is tingly  Occational has area on arm as well  Hematological:  Negative for adenopathy. Does not bruise/bleed easily.  Psychiatric/Behavioral:  Negative for decreased concentration and dysphoric mood. The patient is not nervous/anxious.        Objective:   Physical Exam Constitutional:      General: She is not in acute distress.    Appearance: Normal appearance. She is obese. She is not ill-appearing or diaphoretic.  Eyes:     Conjunctiva/sclera: Conjunctivae normal.  Neck:     Comments: Pain with rom of CS Cardiovascular:     Rate and Rhythm: Normal rate and regular rhythm.  Pulmonary:     Effort: Pulmonary effort is normal. No respiratory distress.     Breath sounds: Normal breath sounds. No wheezing.  Musculoskeletal:     Cervical back: Neck supple.     Right lower leg: Normal. No swelling, deformity, lacerations, tenderness or bony tenderness. No  edema.     Left lower leg: Normal. No swelling, deformity, lacerations, tenderness or bony tenderness. No edema.     Comments: Normal ROM of right knee , hip and ankle w/o discomofrt  Neg SLR bilateral  No tenderness of RLE in area of described pain (oval area lateral and just below knee)  Some mild right lumbar muscular tenderness Also over SI joint  Normal rom of spine w/o discomfort  Normal gait    Lymphadenopathy:     Cervical: No cervical adenopathy.  Skin:    General: Skin is warm and dry.     Findings: No erythema or rash.  Neurological:     Mental Status: She is alert.     Sensory: No sensory deficit.     Motor: No weakness.     Coordination: Coordination normal.     Gait: Gait normal.     Deep Tendon Reflexes: Reflexes normal.  Psychiatric:        Mood and Affect: Mood normal.           Assessment & Plan:   Problem List Items Addressed This Visit       Cardiovascular and Mediastinum   Essential hypertension   Pt stopped benazepril  thinking it could cause her leg pain  I do not think that is the case Encouraged pt to go back to benazepril  40 mg daily with hydrochlorothiazide  50 mg daily  These worked better than amlodipine   BP: (!) 144/80          Other   Lower leg pain - Primary   Right lateral lower leg pain / just under level of knee  Pt describes as intermittent / fleeting/ tingling and sharp in nature More likely when lying down or lying on that side at night Reassuring exam today-unable to reproduce pain with palpation or movement of leg/knee/hip or ankle  May be radicular given nature of pain  Also c/o lumbar discomfort   LS xray ordered ant notes worsening  deg disk ht loss L5-S1 and mild multilevel disk ht loss in other area, progressed from previous   Encouraged use of heat Given rehab handout for sciatica to try  Call back and Er precautions noted in detail today   Orthopedic or PT are options for this       Low back pain   Midline/  stiffness and pressure intermittently  Do wonder if pt's right lateral lower leg pain (fleeting/tingly) is radicular   LS xr today  Encouraged use of heat  Given handout for sciatica rehab       Relevant Orders   DG Lumbar Spine 2-3 Views (Completed)   Other Visit Diagnoses       Need for influenza vaccination       Relevant Orders   Flu vaccine HIGH DOSE PF(Fluzone Trivalent) (Completed)

## 2023-10-24 NOTE — Assessment & Plan Note (Addendum)
 Right lateral lower leg pain / just under level of knee  Pt describes as intermittent / fleeting/ tingling and sharp in nature More likely when lying down or lying on that side at night Reassuring exam today-unable to reproduce pain with palpation or movement of leg/knee/hip or ankle  May be radicular given nature of pain  Also c/o lumbar discomfort   LS xray ordered ant notes worsening deg disk ht loss L5-S1 and mild multilevel disk ht loss in other area, progressed from previous   Encouraged use of heat Given rehab handout for sciatica to try  Call back and Er precautions noted in detail today   Orthopedic or PT are options for this

## 2023-11-07 ENCOUNTER — Encounter: Payer: Self-pay | Admitting: Radiology

## 2023-11-18 ENCOUNTER — Ambulatory Visit: Admitting: Physician Assistant

## 2023-11-18 ENCOUNTER — Telehealth: Payer: Self-pay | Admitting: Family Medicine

## 2023-11-18 DIAGNOSIS — M5442 Lumbago with sciatica, left side: Secondary | ICD-10-CM | POA: Diagnosis not present

## 2023-11-18 DIAGNOSIS — G8929 Other chronic pain: Secondary | ICD-10-CM

## 2023-11-18 DIAGNOSIS — M5441 Lumbago with sciatica, right side: Secondary | ICD-10-CM

## 2023-11-18 MED ORDER — ACETAMINOPHEN-CODEINE 300-30 MG PO TABS: 1.0000 | ORAL_TABLET | Freq: Two times a day (BID) | ORAL | 0 refills | Status: AC | PRN

## 2023-11-18 MED ORDER — METHYLPREDNISOLONE 4 MG PO TBPK: ORAL_TABLET | ORAL | 0 refills | Status: AC

## 2023-11-18 MED ORDER — METHOCARBAMOL 500 MG PO TABS
500.0000 mg | ORAL_TABLET | Freq: Two times a day (BID) | ORAL | 0 refills | Status: AC | PRN
Start: 2023-11-18 — End: ?

## 2023-11-18 NOTE — Telephone Encounter (Signed)
 Copied from CRM #[id number]. Topic: General - Other >> Nov 18, 2023  8:09 AM Victoria A wrote: Reason for CRM: Patient called because she has Ortho appt today and was wondering if she needed X-rays taken by [facility] contact

## 2023-11-18 NOTE — Progress Notes (Signed)
 Office Visit Note   Patient: Rebecca [name]           Date of Birth: [birth year]           MRN: [id number] Visit Date: 11/18/2023              Requested by: Tower, Laine LABOR, MD [address] [place],  [place] [address] PCP: Randeen Laine LABOR, MD   Assessment & Plan: Visit Diagnoses:  1. Chronic right-sided low back pain with bilateral sciatica     Plan: Impression is chronic low back pain with occasional bilateral lower extremity radiculopathy.  Symptoms have recently worsened.  At this point, we have discussed providing her with a new physician guided home exercise program in addition to the exercises she has been doing over the past few months.  I would like to obtain an MRI of her lumbar spine as it sounds like she needs an epidural steroid injection.  We will have her follow-up with Duwaine Pouch to go over the MRI and to schedule ESI if she agrees with this plan.  I sent in a steroid pack, muscle relaxer and Tylenol  3 to take in the meantime.  Follow-Up Instructions: Return for f/u with megan williams to go over MRI results.   Orders:  No orders of the defined types were placed in this encounter.  Meds ordered this encounter  Medications   methylPREDNISolone  (MEDROL  DOSEPAK) 4 MG TBPK tablet    Sig: Take as directed    Dispense:  21 tablet    Refill:  0   methocarbamol (ROBAXIN) 500 MG tablet    Sig: Take 1 tablet (500 mg total) by mouth 2 (two) times daily as needed.    Dispense:  20 tablet    Refill:  0   acetaminophen -codeine (TYLENOL  #3) 300-30 MG tablet    Sig: Take 1 tablet by mouth 2 (two) times daily as needed.    Dispense:  30 tablet    Refill:  0      Procedures: No procedures performed   Clinical Data: No additional findings.   Subjective: No chief complaint on file.   HPI patient is a very pleasant 68 year old female who comes in today with chronic right sided low back pain which has recently worsened.  She denies any recent injury or change in  activity.  The pain in her back is to the middle with radiation to the right and occasionally down both legs.  She describes this as a sharp pain worse with ascending stairs as well as going from a seated to standing position.  She has tried NSAIDs without relief.  She does note some tingling down both legs.  She does notes that she has been to physical therapy years ago but has more recently been working on a physician guided exercise program for the last few months.  She has also undergone what sounds like ESI which has helped in the past.  I am unable to find any lumbar spine MRI in [facility] or [facility] Everywhere.  Review of Systems as detailed in HPI.  All others reviewed and are negative.   Objective: Vital Signs: There were no vitals taken for this visit.  Physical Exam well-developed well-nourished female in no acute distress.  Alert and orient x 3.  Ortho Exam lumbar spine exam: No spinous tenderness.  She does have paraspinous musculature tenderness on the right lower levels.  No pain with lumbar flexion or extension.  Negative straight leg raise the  sides.  No focal weakness.  She is neurovascularly intact distally.  Specialty Comments:  No specialty comments available.  Imaging: Lumbar spine x-rays reviewed by me in [facility] show near bone-on-bone L5-S1   PMFS History: 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   Hair loss 08/23/2022   Class 2 severe obesity due to excess calories with serious comorbidity and body mass index (BMI) of 35.0 to 35.9 in adult 05/08/2022   Hepatic steatosis 01/05/2022   Crepitus of cervical spine 04/30/2021   Medicare annual wellness visit, subsequent 11/24/2020   Low back pain 10/14/2020   Insomnia 07/23/2019   Estrogen deficiency 11/27/2018   Sinusitis, chronic 03/12/2015   Fatigue 05/24/2014   Hyperlipidemia 10/30/2013   Lower leg pain 03/27/2012    Family history of colon cancer requiring screening colonoscopy 09/14/2011   Routine general medical examination at a health care facility 09/05/2011   Mixed incontinence urge and stress 04/14/2011   Hemorrhoids, internal, with bleeding 10/12/2010   External hemorrhoids 11/04/2009   Allergic rhinitis 08/13/2009   Hypokalemia 04/28/2007   Prediabetes 11/15/2006   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   Herpes simplex virus (HSV) infection 07/06/2006   Essential hypertension 07/06/2006   URINARY INCONTINENCE, MIXED 07/06/2006   Past Medical History:  Diagnosis Date   Allergy    Cataract    beginnings of    Glaucoma    Heart murmur    noted on PCP exam 11/23/2012; does not appear urgent   Hemorrhoids    Hypertension    under control with meds., has been on med. > 20 yr.   Radius fracture 11/22/2012   left, comminuted    Family History  Problem Relation Age of Onset   Heart attack Mother    Heart disease Mother    Hypertension Mother        Died from a heart attack   Colon cancer Mother    Diabetes Mother    Dementia Mother    Heart disease Father    Hypertension Father        Dad a a triple bypass.   Coronary artery disease Father        with CABG   Dementia Father    Colon polyps Sister    Breast cancer Maternal Aunt    Diabetes Maternal Aunt    Colon cancer Maternal Uncle    Colon cancer Paternal Uncle    Esophageal cancer Neg Hx    Rectal cancer Neg Hx    Stomach cancer Neg Hx     Past Surgical History:  Procedure Laterality Date   ABDOMINAL HYSTERECTOMY  06/02/2000   partial   COLONOSCOPY  03/2006   COLONOSCOPY WITH PROPOFOL   11/15/2011   ENDOMETRIAL ABLATION     OPEN REDUCTION INTERNAL FIXATION (ORIF) DISTAL RADIAL FRACTURE Left 11/29/2012   Procedure: OPEN REDUCTION INTERNAL FIXATION (ORIF) LEFT  DISTAL RADIAL FRACTURE;  Surgeon: Arley JONELLE Curia, MD;  Location: [facility];  Service: Orthopedics;  Laterality: Left;   PARTIAL HYSTERECTOMY      Social History   Occupational History   Occupation: Engineer, site, also going to school for teaching degree, 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   Occupation: retired  Tobacco Use   Smoking status: Never   Smokeless tobacco: Never  Vaping Use   Vaping status: Never Used  Substance and Sexual Activity   Alcohol use: No   Drug use: No  Sexual activity: Not on file

## 2023-11-18 NOTE — Addendum Note (Signed)
 Addended by: Garrett Mitchum on: 11/18/2023 10:18 AM   Modules accepted: Orders

## 2023-11-18 NOTE — Telephone Encounter (Signed)
 Pt is at Ortho appt now, they are in [facility] so they can see Xrays

## 2023-11-25 ENCOUNTER — Ambulatory Visit
Admission: RE | Admit: 2023-11-25 | Discharge: 2023-11-25 | Disposition: A | Discharge status: Home / Self Care | Source: Ambulatory Visit | Attending: Orthopaedic Surgery | Admitting: Orthopaedic Surgery

## 2023-11-25 DIAGNOSIS — G8929 Other chronic pain: Secondary | ICD-10-CM

## 2023-12-20 ENCOUNTER — Ambulatory Visit: Admitting: Physical Medicine and Rehabilitation

## 2023-12-20 ENCOUNTER — Encounter: Payer: Self-pay | Admitting: Physical Medicine and Rehabilitation

## 2023-12-20 DIAGNOSIS — R202 Paresthesia of skin: Secondary | ICD-10-CM

## 2023-12-20 DIAGNOSIS — M545 Low back pain, unspecified: Secondary | ICD-10-CM

## 2023-12-20 DIAGNOSIS — M47816 Spondylosis without myelopathy or radiculopathy, lumbar region: Secondary | ICD-10-CM

## 2023-12-20 DIAGNOSIS — G8929 Other chronic pain: Secondary | ICD-10-CM

## 2023-12-20 NOTE — Progress Notes (Unsigned)
 Rebecca [name] - 68 y.o. female MRN [id number]  Date of birth: 1955/02/28  Office Visit Note: Visit Date: 12/20/2023 PCP: Randeen Laine LABOR, MD Referred by: Tower, Laine LABOR, MD  Subjective: Chief Complaint  Patient presents with   Lower Back - Pain   HPI: Rebecca [name] is a 68 y.o. female who comes in today per the request of Morna Grave, [place] for evaluation of chronic, worsening and severe bilateral lower back pain. Intermittent numbness/tingling to bilateral lower extremities. Pain ongoing for several years, worsens with movement and activity. She reports increased pain with household activities such as cooking and cleaning. She describes pain as sore and aching sensation, currently rates as 1 out of 10. States her pain is intermittent in nature, there are some days that she is completely pain free. Some relief of pain with home exercise regimen, rest and use of medications. History of formal physical therapy with minimal relief of pain. Recent lumbar MRI imaging shows diffuse lumbar disc and facet degeneration, most notable at L4-L5 where there is moderate to severe spinal stenosis. There is severe facet arthropathy at L5-S1. No history of lumbar surgery/injections. Patient denies focal weakness. No recent trauma or falls.        Review of Systems  Musculoskeletal:  Positive for back pain.  Neurological:  Positive for tingling. Negative for sensory change, focal weakness and weakness.  All other systems reviewed and are negative.  Otherwise per HPI.  Assessment & Plan: Visit Diagnoses:    ICD-10-CM   1. Chronic bilateral low back pain without sciatica  M54.50    G89.29     2. Paresthesia of skin  R20.2     3. Facet arthropathy, lumbar  M47.816        Plan: Findings:  Chronic, worsening and severe bilateral lower back pain. No radicular symptoms down the legs. Her pain is mild today, she continues with home exercise regimen, rest and use of medications. I discussed recent lumbar  MRI with her today using imaging and spine model. There is moderate to severe spinal canal stenosis at L4-L5. Severe facet arthropathy at L4-L5 and L5-S1. We discussed treatment plan today including possibility of performing lumbar injections. Her pain seems more facet mediated today, would recommend bilateral L4-L5 and L5-S1, should her pain present as more radicular in nature would consider L4-L5 interlaminar epidural steroid injection. She would like to hold on injection at this time and prefers to re-group with physical therapy. She is requesting PT in [place] near her home. She has no questions at this time. I am happy to see her back, we can look at injections if her pain returns. Her exam today is non focal, good strength noted to bilateral lower extremities.        Meds & Orders: No orders of the defined types were placed in this encounter.  No orders of the defined types were placed in this encounter.   Follow-up: Return if symptoms worsen or fail to improve.   Procedures: No procedures performed      Clinical History: EXAM: MRI LUMBAR SPINE 11/25/2023 07:33:01 AM   TECHNIQUE: Multiplanar multisequence MRI of the lumbar spine was performed without the administration of intravenous contrast.   COMPARISON: Lumbar spine radiographs 10/24/2023.   CLINICAL HISTORY: Chronic right-sided low back pain with bilateral sciatica.   FINDINGS:   BONES AND ALIGNMENT: The lowest well-formed disc space is designated L5-S1. There are no ribs at T12. Trace anterolisthesis of L4 and L5. No fracture or suspicious  marrow lesion. Bilateral posterior element edema at L4-L5 likely related to facet arthropathy.   SPINAL CORD: The conus medullaris terminates at T12-L1 and is normal in signal.   SOFT TISSUES: No paraspinal mass.   DISC LEVELS: Disc desiccation throughout the lumbar spine. Severe disc space narrowing at L5-S1 with mild to moderate narrowing at L4-L5 and up to mild  narrowing elsewhere. Diffuse congenital narrowing of the lumbar spinal canal due to short pedicles.   T12-L1: Mild facet hypertrophy without disc herniation or stenosis.   L1-L2: Disc bulging, a left foraminal disc protrusion, and moderate facet hypertrophy result in moderate left neural foraminal stenosis without spinal stenosis.   L2-L3: Disc bulging, a small right foraminal disc protrusion, and moderate facet and ligamentum flavum hypertrophy result in mild right neural foraminal stenosis. Prominent dorsal epidural fat contributes to borderline spinal stenosis.   L3-L4: Disc bulging, a right foraminal to extraforaminal disc protrusion, mildly prominent epidural fat, and severe facet and ligamentum flavum hypertrophy result in mild spinal stenosis and moderate right and mild left neural foraminal stenosis.   L4-L5: Anterolisthesis with diffuse bulging of uncovered disc, a right foraminal to extraforaminal disc protrusion, mildly prominent epidural fat, and severe facet and ligamentum flavum hypertrophy result in moderate to severe spinal stenosis and moderate to severe right and mild to moderate left neural foraminal stenosis.   L5-S1: Disc bulging, endplate spurring, disc space height loss, and severe facet hypertrophy result in mild right lateral recess stenosis and mild bilateral neural foraminal stenosis without significant spinal stenosis.   IMPRESSION: 1. Diffuse lumbar disc and facet degeneration, most notable at L4-L5 where there is moderate to severe spinal stenosis and right greater than left neural foraminal stenosis. 2. Mild spinal stenosis and moderate right neural foraminal stenosis at L3-L4.   Electronically signed by: Dasie Hamburg MD 11/29/2023 09:35 AM EST RP Workstation: [id number]   She reports that she has never smoked. She has never used smokeless tobacco.  Recent Labs    05/20/23 0809 09/02/23 0815  HGBA1C 6.0* 5.9*    Objective:  VS:  HT:     WT:   BMI:     BP:   HR: bpm  TEMP: ( )  RESP:  Physical Exam Vitals and nursing note reviewed.  HENT:     Head: Normocephalic and atraumatic.     Right Ear: External ear normal.     Left Ear: External ear normal.     Nose: Nose normal.     Mouth/Throat:     Mouth: Mucous membranes are moist.  Eyes:     Extraocular Movements: Extraocular movements intact.  Cardiovascular:     Rate and Rhythm: Normal rate.     Pulses: Normal pulses.  Pulmonary:     Effort: Pulmonary effort is normal.  Abdominal:     General: Abdomen is flat. There is no distension.  Musculoskeletal:        General: Tenderness present.     Cervical back: Normal range of motion.     Comments: Patient rises from seated position to standing without difficulty. Pain noted with facet loading and lumbar extension. 5/5 strength noted with bilateral hip flexion, knee flexion/extension, ankle dorsiflexion/plantarflexion and EHL. No clonus noted bilaterally. No pain upon palpation of greater trochanters. No pain with internal/external rotation of bilateral hips. Sensation intact bilaterally. Negative slump test bilaterally. Ambulates without aid, gait steady.     Skin:    General: Skin is warm and dry.     Capillary Refill: Capillary  refill takes less than 2 seconds.  Neurological:     General: No focal deficit present.     Mental Status: She is alert and oriented to person, place, and time.  Psychiatric:        Mood and Affect: Mood normal.        Behavior: Behavior normal.     Ortho Exam  Imaging: No results found.  Past Medical/Family/Surgical/Social History: Medications & Allergies reviewed per EMR, new medications updated. 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   Hair loss 08/23/2022   Class 2 severe obesity due to excess calories with serious comorbidity and body mass index (BMI) of 35.0 to 35.9 in adult  05/08/2022   Hepatic steatosis 01/05/2022   Crepitus of cervical spine 04/30/2021   Medicare annual wellness visit, subsequent 11/24/2020   Low back pain 10/14/2020   Insomnia 07/23/2019   Estrogen deficiency 11/27/2018   Sinusitis, chronic 03/12/2015   Fatigue 05/24/2014   Hyperlipidemia 10/30/2013   Lower leg pain 03/27/2012   Family history of colon cancer requiring screening colonoscopy 09/14/2011   Routine general medical examination at a health care facility 09/05/2011   Mixed incontinence urge and stress 04/14/2011   Hemorrhoids, internal, with bleeding 10/12/2010   External hemorrhoids 11/04/2009   Allergic rhinitis 08/13/2009   Hypokalemia 04/28/2007   Prediabetes 11/15/2006   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   Herpes simplex virus (HSV) infection 07/06/2006   Essential hypertension 07/06/2006   URINARY INCONTINENCE, MIXED 07/06/2006   Past Medical History:  Diagnosis Date   Allergy    Cataract    beginnings of    Glaucoma    Heart murmur    noted on PCP exam 11/23/2012; does not appear urgent   Hemorrhoids    Hypertension    under control with meds., has been on med. > 20 yr.   Radius fracture 11/22/2012   left, comminuted   Family History  Problem Relation Age of Onset   Heart attack Mother    Heart disease Mother    Hypertension Mother        Died from a heart attack   Colon cancer Mother    Diabetes Mother    Dementia Mother    Heart disease Father    Hypertension Father        Dad a a triple bypass.   Coronary artery disease Father        with CABG   Dementia Father    Colon polyps Sister    Breast cancer Maternal Aunt    Diabetes Maternal Aunt    Colon cancer Maternal Uncle    Colon cancer Paternal Uncle    Esophageal cancer Neg Hx    Rectal cancer Neg Hx    Stomach cancer Neg Hx    Past Surgical History:  Procedure Laterality Date   ABDOMINAL HYSTERECTOMY  06/02/2000   partial   COLONOSCOPY  03/2006   COLONOSCOPY WITH PROPOFOL    11/15/2011   ENDOMETRIAL ABLATION     OPEN REDUCTION INTERNAL FIXATION (ORIF) DISTAL RADIAL FRACTURE Left 11/29/2012   Procedure: OPEN REDUCTION INTERNAL FIXATION (ORIF) LEFT  DISTAL RADIAL FRACTURE;  Surgeon: Arley JONELLE Curia, MD;  Location: [facility];  Service: Orthopedics;  Laterality: Left;   PARTIAL HYSTERECTOMY     Social History   Occupational History   Occupation: Engineer, site, also going to school for teaching degree, 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  Occupation: retired  Tobacco Use   Smoking status: Never   Smokeless tobacco: Never  Vaping Use   Vaping status: Never Used  Substance and Sexual Activity   Alcohol use: No   Drug use: No   Sexual activity: Not on file

## 2023-12-20 NOTE — Progress Notes (Unsigned)
 Pain Scale   Average Pain 1 Patient advising she has lower back pain radiating to right leg.        +Driver, -BT, -Dye Allergies.

## 2023-12-26 NOTE — Therapy (Incomplete)
 " OUTPATIENT PHYSICAL THERAPY THORACOLUMBAR EVALUATION/TREATMENT    Patient Name: Rebecca [name] MRN: [id number] DOB:05-08-1955, 68 y.o., female Today's Date: 12/26/2023  END OF SESSION:   Past Medical History:  Diagnosis Date   Allergy    Cataract    beginnings of    Glaucoma    Heart murmur    noted on PCP exam 11/23/2012; does not appear urgent   Hemorrhoids    Hypertension    under control with meds., has been on med. > 20 yr.   Radius fracture 11/22/2012   left, comminuted   Past Surgical History:  Procedure Laterality Date   ABDOMINAL HYSTERECTOMY  06/02/2000   partial   COLONOSCOPY  03/2006   COLONOSCOPY WITH PROPOFOL   11/15/2011   ENDOMETRIAL ABLATION     OPEN REDUCTION INTERNAL FIXATION (ORIF) DISTAL RADIAL FRACTURE Left 11/29/2012   Procedure: OPEN REDUCTION INTERNAL FIXATION (ORIF) LEFT  DISTAL RADIAL FRACTURE;  Surgeon: Arley JONELLE Curia, MD;  Location: [facility];  Service: Orthopedics;  Laterality: Left;   PARTIAL HYSTERECTOMY     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   Hair loss 08/23/2022   Class 2 severe obesity due to excess calories with serious comorbidity and body mass index (BMI) of 35.0 to 35.9 in adult 05/08/2022   Hepatic steatosis 01/05/2022   Crepitus of cervical spine 04/30/2021   Medicare annual wellness visit, subsequent 11/24/2020   Low back pain 10/14/2020   Insomnia 07/23/2019   Estrogen deficiency 11/27/2018   Sinusitis, chronic 03/12/2015   Fatigue 05/24/2014   Hyperlipidemia 10/30/2013   Lower leg pain 03/27/2012   Family history of colon cancer requiring screening colonoscopy 09/14/2011   Routine general medical examination at a health care facility 09/05/2011   Mixed incontinence urge and stress 04/14/2011   Hemorrhoids, internal, with bleeding 10/12/2010   External hemorrhoids 11/04/2009   Allergic rhinitis  08/13/2009   Hypokalemia 04/28/2007   Prediabetes 11/15/2006   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   Herpes simplex virus (HSV) infection 07/06/2006   Essential hypertension 07/06/2006   URINARY INCONTINENCE, MIXED 07/06/2006    PCP: Randeen Laine LABOR, MD  REFERRING PROVIDER: Trudy Duwaine BRAVO, NP  REFERRING DIAG:  M54.50,G89.29 (ICD-10-CM) - Chronic bilateral low back pain without sciatica  R20.2 (ICD-10-CM) - Paresthesia of skin  M47.816 (ICD-10-CM) - Facet arthropathy, lumbar    RATIONALE FOR EVALUATION AND TREATMENT: Rehabilitation  THERAPY DIAG: No diagnosis found.  ONSET DATE: Chronic   FOLLOW-UP APPT SCHEDULED WITH REFERRING PROVIDER: {yes/no:20286}    SUBJECTIVE:  SUBJECTIVE STATEMENT:    Patient reporting to OPPT with a chief concern of lower back pain.   PERTINENT HISTORY:   Rebecca [name] is a 68 y.o. female who arrives to OPPT presenting with worsening lower back pain. Patient reports that she has had chronic lower back pain for several years. She reports intermittent numbness and tingling to bilateral lower extremities. She describes the pain as ***. Aggravating factors of pain with household activities such as cooking and cleaning. She states her pain is intermittent based on her activities. She has had physical therapy in the past with minimal relief. No previous history of lumbar surgery or ESI.   She denies recent trauma to her lower spine or falls. Denies changes to b/b, saddle paraesthesia.   Imaging (Per Chart Review):   MRI LUMBAR SPINE 11/25/2023 07:33:01 AM   TECHNIQUE: Multiplanar multisequence MRI of the lumbar spine was performed without the administration of intravenous contrast.   COMPARISON: Lumbar spine radiographs 10/24/2023.   CLINICAL HISTORY: Chronic  right-sided low back pain with bilateral sciatica.   FINDINGS:   BONES AND ALIGNMENT: The lowest well-formed disc space is designated L5-S1. There are no ribs at T12. Trace anterolisthesis of L4 and L5. No fracture or suspicious marrow lesion. Bilateral posterior element edema at L4-L5 likely related to facet arthropathy.   SPINAL CORD: The conus medullaris terminates at T12-L1 and is normal in signal.   SOFT TISSUES: No paraspinal mass.   DISC LEVELS: Disc desiccation throughout the lumbar spine. Severe disc space narrowing at L5-S1 with mild to moderate narrowing at L4-L5 and up to mild narrowing elsewhere. Diffuse congenital narrowing of the lumbar spinal canal due to short pedicles.   T12-L1: Mild facet hypertrophy without disc herniation or stenosis.   L1-L2: Disc bulging, a left foraminal disc protrusion, and moderate facet hypertrophy result in moderate left neural foraminal stenosis without spinal stenosis.   L2-L3: Disc bulging, a small right foraminal disc protrusion, and moderate facet and ligamentum flavum hypertrophy result in mild right neural foraminal stenosis. Prominent dorsal epidural fat contributes to borderline spinal stenosis.   L3-L4: Disc bulging, a right foraminal to extraforaminal disc protrusion, mildly prominent epidural fat, and severe facet and ligamentum flavum hypertrophy result in mild spinal stenosis and moderate right and mild left neural foraminal stenosis.   L4-L5: Anterolisthesis with diffuse bulging of uncovered disc, a right foraminal to extraforaminal disc protrusion, mildly prominent epidural fat, and severe facet and ligamentum flavum hypertrophy result in moderate to severe spinal stenosis and moderate to severe right and mild to moderate left neural foraminal stenosis.  L5-S1: Disc bulging, endplate spurring, disc space height loss, and severe facet hypertrophy result in mild right lateral recess stenosis and mild  bilateral neural foraminal stenosis without significant spinal stenosis. IMPRESSION: 1. Diffuse lumbar disc and facet degeneration, most notable at L4-L5 where there is moderate to severe spinal stenosis and right greater than left neural foraminal stenosis. 2. Mild spinal stenosis and moderate right neural foraminal stenosis at L3-L4.   Electronically signed by: Dasie Hamburg MD 11/29/2023 09:35 AM EST RP Workstation: [id number]    PAIN:    Pain Intensity: Present: /10, Best: /10, Worst: /10 Pain location: *** Pain Quality: {PAIN DESCRIPTION:21022940}  Radiating: {yes/no:20286}  Numbness/Tingling: {yes/no:20286} Aggravating factors: *** Relieving factors: ***  PRECAUTIONS: None  WEIGHT BEARING RESTRICTIONS: No  FALLS: Has patient fallen in last 6 months? {fallsyesno:27318}  Living Environment Lives with: {[facility] lives with:25569::lives with their family} Lives in: {Lives in:25570} Stairs: {opstairs:27293} Has following  equipment at home: {Assistive devices:23999}  Prior level of function: {PLOF:24004}  Occupational demands:   Hobbies:   PATIENT GOALS: ***   OBJECTIVE:  Patient Surveys  Modified Oswestry:  MODIFIED OSWESTRY DISABILITY SCALE  Date: *** Score  Pain intensity {ODI 1:32962}  2. Personal care (washing, dressing, etc.) {ODI 2:32963}  3. Lifting {ODI 3:32964}  4. Walking {ODI 4:32965}  5. Sitting {ODI 5:32966}  6. Standing {ODI 6:32967}  7. Sleeping {ODI 7:32968}  8. Social Life {ODI 8:32969}  9. Traveling {ODI 9:32970}  10. Employment/ Homemaking {ODI 10:32971}  Total ***/50   Interpretation of scores: Score Category Description  0-20% Minimal Disability The patient can cope with most living activities. Usually no treatment is indicated apart from advice on lifting, sitting and exercise  21-40% Moderate Disability The patient experiences more pain and difficulty with sitting, lifting and standing. Travel and social life are more difficult and  they may be disabled from work. Personal care, sexual activity and sleeping are not grossly affected, and the patient can usually be managed by conservative means  41-60% Severe Disability Pain remains the main problem in this group, but activities of daily living are affected. These patients require a detailed investigation  61-80% Crippled Back pain impinges on all aspects of the patients life. Positive intervention is required  81-100% Bed-bound These patients are either bed-bound or exaggerating their symptoms  Bluford FORBES Zoe DELENA Karon DELENA, et al. Surgery versus conservative management of stable thoracolumbar fracture: the PRESTO feasibility RCT. [place] ([place]): [employer]; 2021 Nov. [facility] Technology Assessment, No. 25.62.) Appendix 3, Oswestry Disability Index category descriptors. Available from: Findjewelers.cz  Minimally Clinically Important Difference (MCID) = 12.8%  Cognition Patient is oriented to person, place, and time.  Recent memory is intact.  Remote memory is intact.  Attention span and concentration are intact.  Expressive speech is intact.  Patient's fund of knowledge is within normal limits for educational level.    Gross Musculoskeletal Assessment Tremor: None Bulk: Normal Tone: Normal No visible step-off along spinal column, no signs of scoliosis  GAIT: Distance walked: *** Assistive device utilized: {Assistive devices:23999} Level of assistance: {Levels of assistance:24026} Comments: ***  Posture: Lumbar lordosis: WNL Iliac crest height: Equal bilaterally Lumbar lateral shift: Negative  AROM AROM (Normal range in degrees) AROM   Lumbar   Flexion (65)   Extension (30)   Right lateral flexion (25)   Left lateral flexion (25)   Right rotation (30)   Left rotation (30)       Hip Right Left  Flexion (125)    Extension (15)    Abduction (40)    Adduction     Internal Rotation (45)    External Rotation  (45)        Knee    Flexion (135)    Extension (0)        Ankle    Dorsiflexion (20)    Plantarflexion (50)    Inversion (35)    Eversion (15)    (* = pain; Blank rows = not tested)  LE MMT: MMT (out of 5) Right  Left   Hip flexion    Hip extension    Hip abduction    Hip adduction    Hip internal rotation    Hip external rotation    Knee flexion    Knee extension    Ankle dorsiflexion    Ankle plantarflexion    Ankle inversion    Ankle eversion    (* =  pain; Blank rows = not tested)  Sensation Grossly intact to light touch throughout bilateral LEs as determined by testing dermatomes L2-S2. Proprioception, stereognosis, and hot/cold testing deferred on this date.  Reflexes R/L Knee Jerk (L3/4): 2+/2+  Ankle Jerk (S1/2): 2+/2+   Muscle Length Hamstrings: R: {NEGATIVE/POSITIVE QNM:80001} L: {NEGATIVE/POSITIVE QNM:80001} Ely (quadriceps): R: {NEGATIVE/POSITIVE QNM:80001} L: {NEGATIVE/POSITIVE QNM:80001} Thomas (hip flexors): R: {NEGATIVE/POSITIVE QNM:80001} L: {NEGATIVE/POSITIVE QNM:80001} Ober: R: {NEGATIVE/POSITIVE QNM:80001} L: {NEGATIVE/POSITIVE QNM:80001}  Palpation Location Right Left         Lumbar paraspinals    Quadratus Lumborum    Gluteus Maximus    Gluteus Medius    Deep hip external rotators    PSIS    Fortin's Area (SIJ)    Greater Trochanter    (Blank rows = not tested) Graded on 0-4 scale (0 = no pain, 1 = pain, 2 = pain with wincing/grimacing/flinching, 3 = pain with withdrawal, 4 = unwilling to allow palpation)  Passive Accessory  Motion Pt denies reproduction of back pain with CPA L1-L5 and UPA bilaterally L1-L5. Generally, hypomobile throughout  Special Tests Lumbar Radiculopathy and Discogenic: Centralization and Peripheralization (SN 92, -LR 0.12): {NEGATIVE/POSITIVE FOR:19998} Slump (SN 83, -LR 0.32): R: {NEGATIVE/POSITIVE FOR:19998} L: {NEGATIVE/POSITIVE FOR:19998} SLR (SN 92, -LR 0.29): R: {NEGATIVE/POSITIVE QNM:80001} L:   {NEGATIVE/POSITIVE QNM:80001} Crossed SLR (SP 90): R: {NEGATIVE/POSITIVE QNM:80001} L: {NEGATIVE/POSITIVE QNM:80001}  Facet Joint: Extension-Rotation (SN 100, -LR 0.0): R: {NEGATIVE/POSITIVE QNM:80001} L: {NEGATIVE/POSITIVE QNM:80001}  Lumbar Foraminal Stenosis: Lumbar quadrant (SN 70): R: {NEGATIVE/POSITIVE QNM:80001} L: {NEGATIVE/POSITIVE QNM:80001}  Hip: FABER (SN 81): R: {NEGATIVE/POSITIVE QNM:80001} L: {NEGATIVE/POSITIVE QNM:80001} FADIR (SN 94): R: {NEGATIVE/POSITIVE FOR:19998} L: {NEGATIVE/POSITIVE QNM:80001} Hip scour (SN 50): R: {NEGATIVE/POSITIVE QNM:80001} L: {NEGATIVE/POSITIVE QNM:80001}  SIJ:  Thigh Thrust (SN 88, -LR 0.18) : R: {NEGATIVE/POSITIVE QNM:80001} L: {NEGATIVE/POSITIVE QNM:80001}  Piriformis Syndrome: FAIR Test (SN 88, SP 83): R: {NEGATIVE/POSITIVE QNM:80001} L: {NEGATIVE/POSITIVE QNM:80001}  Functional Tasks Lifting: Deep squat: Sit to stand: Forward Step-Down Test: R:  L:  Lateral Step-Down Test: R:  L:     TODAY'S TREATMENT: DATE: 12/27/2023  Therapeutic Exercise:  Reviewed HEP with patient return demonstration:        PATIENT EDUCATION:  Education details: HEP, POC, Exercise Technique  Person educated: Patient Education method: Explanation, Demonstration, and Handouts Education comprehension: verbalized understanding, returned demonstration, and needs further education   HOME EXERCISE PROGRAM:     ASSESSMENT:  CLINICAL IMPRESSION: Patient is a 68 y.o. female who was seen today for physical therapy evaluation and treatment for lower back pain.   OBJECTIVE IMPAIRMENTS: {opptimpairments:25111}.   ACTIVITY LIMITATIONS: {activitylimitations:27494}  PARTICIPATION LIMITATIONS: {participationrestrictions:25113}  PERSONAL FACTORS: {Personal factors:25162} are also affecting patient's functional outcome.   REHAB POTENTIAL: {rehabpotential:25112}  CLINICAL DECISION MAKING: {clinical decision making:25114}  EVALUATION COMPLEXITY:  {Evaluation complexity:25115}   GOALS: Goals reviewed with patient? {yes/no:20286}  SHORT TERM GOALS: Target date: {follow up:25551}  Pt will be independent with HEP in order to improve strength and decrease back pain to improve pain-free function at home and work. Baseline: *** Goal status: INITIAL   LONG TERM GOALS: Target date: {follow up:25551}  Pt will decrease 5TSTS by at least 3 seconds in order to demonstrate clinically significant improvement in LE strength Baseline:  Goal status: INITIAL  2.  Pt will decrease worst back pain by at least 2 points on the NPRS in order to demonstrate clinically significant reduction in back pain. Baseline: *** Goal status: INITIAL  3.  Pt will decrease mODI score by at least 13  points in order demonstrate clinically significant reduction in back pain/disability.       Baseline: *** Goal status: INITIAL  4.  Pt will perform kettlebell squat with good technique and minimal to no back pain in order to demonstrate proper lifting at home. Baseline: *** Goal status: INITIAL   PLAN:  PT FREQUENCY: {rehab frequency:25116}  PT DURATION: {rehab duration:25117}  PLANNED INTERVENTIONS: 97164- PT Re-evaluation, 97750- Physical Performance Testing, 97110-Therapeutic exercises, 97530- Therapeutic activity, [id number]- Neuromuscular re-education, 97535- Self Care, [id number]- Manual therapy, Patient/Family education, Balance training, and Moist heat  PLAN FOR NEXT SESSION: Review HEP, Flexion based core strengthening, LE strengthening (gluteal)     Lonni Pall PT, DPT Physical Therapist- [place]  12/26/2023, 5:19 PM  "

## 2023-12-27 ENCOUNTER — Ambulatory Visit

## 2023-12-27 ENCOUNTER — Ambulatory Visit: Admitting: Orthopaedic Surgery

## 2024-01-02 ENCOUNTER — Ambulatory Visit

## 2024-01-02 NOTE — Therapy (Unsigned)
 " OUTPATIENT PHYSICAL THERAPY THORACOLUMBAR EVALUATION/TREATMENT    Patient Name: Rebecca [name] MRN: [id number] DOB:[birth year], 68 y.o., female Today's Date: 01/02/2024  END OF SESSION:   Past Medical History:  Diagnosis Date   Allergy    Cataract    beginnings of    Glaucoma    Heart murmur    noted on PCP exam 11/23/2012; does not appear urgent   Hemorrhoids    Hypertension    under control with meds., has been on med. > 20 yr.   Radius fracture 11/22/2012   left, comminuted   Past Surgical History:  Procedure Laterality Date   ABDOMINAL HYSTERECTOMY  06/02/2000   partial   COLONOSCOPY  03/2006   COLONOSCOPY WITH PROPOFOL   11/15/2011   ENDOMETRIAL ABLATION     OPEN REDUCTION INTERNAL FIXATION (ORIF) DISTAL RADIAL FRACTURE Left 11/29/2012   Procedure: OPEN REDUCTION INTERNAL FIXATION (ORIF) LEFT  DISTAL RADIAL FRACTURE;  Surgeon: Arley JONELLE Curia, MD;  Location: [facility];  Service: Orthopedics;  Laterality: Left;   PARTIAL HYSTERECTOMY     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   Hair loss 08/23/2022   Class 2 severe obesity due to excess calories with serious comorbidity and body mass index (BMI) of 35.0 to 35.9 in adult 05/08/2022   Hepatic steatosis 01/05/2022   Crepitus of cervical spine 04/30/2021   Medicare annual wellness visit, subsequent 11/24/2020   Low back pain 10/14/2020   Insomnia 07/23/2019   Estrogen deficiency 11/27/2018   Sinusitis, chronic 03/12/2015   Fatigue 05/24/2014   Hyperlipidemia 10/30/2013   Lower leg pain 03/27/2012   Family history of colon cancer requiring screening colonoscopy 09/14/2011   Routine general medical examination at a health care facility 09/05/2011   Mixed incontinence urge and stress 04/14/2011   Hemorrhoids, internal, with bleeding 10/12/2010   External hemorrhoids 11/04/2009   Allergic rhinitis  08/13/2009   Hypokalemia 04/28/2007   Prediabetes 11/15/2006   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   Herpes simplex virus (HSV) infection 07/06/2006   Essential hypertension 07/06/2006   URINARY INCONTINENCE, MIXED 07/06/2006    PCP: Randeen Laine LABOR, MD  REFERRING PROVIDER: Trudy Duwaine BRAVO, NP  REFERRING DIAG:  M54.50,G89.29 (ICD-10-CM) - Chronic bilateral low back pain without sciatica  R20.2 (ICD-10-CM) - Paresthesia of skin  M47.816 (ICD-10-CM) - Facet arthropathy, lumbar    RATIONALE FOR EVALUATION AND TREATMENT: Rehabilitation  THERAPY DIAG: No diagnosis found.  ONSET DATE: Chronic   FOLLOW-UP APPT SCHEDULED WITH REFERRING PROVIDER: {yes/no:20286}    SUBJECTIVE:  SUBJECTIVE STATEMENT:    Patient reporting to OPPT with a chief concern of lower back pain.   PERTINENT HISTORY:   Rebecca [name] is a 68 y.o. female who arrives to OPPT presenting with worsening lower back pain. Patient reports that she has had chronic lower back pain for several years. She reports intermittent numbness and tingling to bilateral lower extremities. She describes the pain as ***. Aggravating factors of pain with household activities such as cooking and cleaning. She states her pain is intermittent based on her activities. She has had physical therapy in the past with minimal relief. No previous history of lumbar surgery or ESI.   She denies recent trauma to her lower spine or falls. Denies changes to b/b, saddle paraesthesia.   Imaging (Per Chart Review):   MRI LUMBAR SPINE 11/25/2023 07:33:01 AM   TECHNIQUE: Multiplanar multisequence MRI of the lumbar spine was performed without the administration of intravenous contrast.   COMPARISON: Lumbar spine radiographs 10/24/2023.   CLINICAL HISTORY: Chronic  right-sided low back pain with bilateral sciatica.   FINDINGS:   BONES AND ALIGNMENT: The lowest well-formed disc space is designated L5-S1. There are no ribs at T12. Trace anterolisthesis of L4 and L5. No fracture or suspicious marrow lesion. Bilateral posterior element edema at L4-L5 likely related to facet arthropathy.   SPINAL CORD: The conus medullaris terminates at T12-L1 and is normal in signal.   SOFT TISSUES: No paraspinal mass.   DISC LEVELS: Disc desiccation throughout the lumbar spine. Severe disc space narrowing at L5-S1 with mild to moderate narrowing at L4-L5 and up to mild narrowing elsewhere. Diffuse congenital narrowing of the lumbar spinal canal due to short pedicles.   T12-L1: Mild facet hypertrophy without disc herniation or stenosis.   L1-L2: Disc bulging, a left foraminal disc protrusion, and moderate facet hypertrophy result in moderate left neural foraminal stenosis without spinal stenosis.   L2-L3: Disc bulging, a small right foraminal disc protrusion, and moderate facet and ligamentum flavum hypertrophy result in mild right neural foraminal stenosis. Prominent dorsal epidural fat contributes to borderline spinal stenosis.   L3-L4: Disc bulging, a right foraminal to extraforaminal disc protrusion, mildly prominent epidural fat, and severe facet and ligamentum flavum hypertrophy result in mild spinal stenosis and moderate right and mild left neural foraminal stenosis.   L4-L5: Anterolisthesis with diffuse bulging of uncovered disc, a right foraminal to extraforaminal disc protrusion, mildly prominent epidural fat, and severe facet and ligamentum flavum hypertrophy result in moderate to severe spinal stenosis and moderate to severe right and mild to moderate left neural foraminal stenosis.  L5-S1: Disc bulging, endplate spurring, disc space height loss, and severe facet hypertrophy result in mild right lateral recess stenosis and mild  bilateral neural foraminal stenosis without significant spinal stenosis. IMPRESSION: 1. Diffuse lumbar disc and facet degeneration, most notable at L4-L5 where there is moderate to severe spinal stenosis and right greater than left neural foraminal stenosis. 2. Mild spinal stenosis and moderate right neural foraminal stenosis at L3-L4.   Electronically signed by: Dasie Hamburg MD 11/29/2023 09:35 AM EST RP Workstation: [id number]    PAIN:    Pain Intensity: Present: /10, Best: /10, Worst: /10 Pain location: *** Pain Quality: {PAIN DESCRIPTION:21022940}  Radiating: {yes/no:20286}  Numbness/Tingling: {yes/no:20286} Aggravating factors: *** Relieving factors: ***  PRECAUTIONS: None  WEIGHT BEARING RESTRICTIONS: No  FALLS: Has patient fallen in last 6 months? {fallsyesno:27318}  Living Environment Lives with: {[facility] lives with:25569::lives with their family} Lives in: {Lives in:25570} Stairs: {opstairs:27293} Has following  equipment at home: {Assistive devices:23999}  Prior level of function: {PLOF:24004}  Occupational demands:   Hobbies:   PATIENT GOALS: ***   OBJECTIVE:  Patient Surveys  Modified Oswestry:  MODIFIED OSWESTRY DISABILITY SCALE  Date: *** Score  Pain intensity {ODI 1:32962}  2. Personal care (washing, dressing, etc.) {ODI 2:32963}  3. Lifting {ODI 3:32964}  4. Walking {ODI 4:32965}  5. Sitting {ODI 5:32966}  6. Standing {ODI 6:32967}  7. Sleeping {ODI 7:32968}  8. Social Life {ODI 8:32969}  9. Traveling {ODI 9:32970}  10. Employment/ Homemaking {ODI 10:32971}  Total ***/50   Interpretation of scores: Score Category Description  0-20% Minimal Disability The patient can cope with most living activities. Usually no treatment is indicated apart from advice on lifting, sitting and exercise  21-40% Moderate Disability The patient experiences more pain and difficulty with sitting, lifting and standing. Travel and social life are more difficult and  they may be disabled from work. Personal care, sexual activity and sleeping are not grossly affected, and the patient can usually be managed by conservative means  41-60% Severe Disability Pain remains the main problem in this group, but activities of daily living are affected. These patients require a detailed investigation  61-80% Crippled Back pain impinges on all aspects of the patients life. Positive intervention is required  81-100% Bed-bound These patients are either bed-bound or exaggerating their symptoms  Bluford FORBES Zoe DELENA Karon DELENA, et al. Surgery versus conservative management of stable thoracolumbar fracture: the PRESTO feasibility RCT. [place] ([place]): [employer]; 2021 Nov. [facility] Technology Assessment, No. 25.62.) Appendix 3, Oswestry Disability Index category descriptors. Available from: Findjewelers.cz  Minimally Clinically Important Difference (MCID) = 12.8%  Cognition Patient is oriented to person, place, and time.  Recent memory is intact.  Remote memory is intact.  Attention span and concentration are intact.  Expressive speech is intact.  Patient's fund of knowledge is within normal limits for educational level.    Gross Musculoskeletal Assessment Tremor: None Bulk: Normal Tone: Normal No visible step-off along spinal column, no signs of scoliosis  GAIT: Distance walked: *** Assistive device utilized: {Assistive devices:23999} Level of assistance: {Levels of assistance:24026} Comments: ***  Posture: Lumbar lordosis: WNL Iliac crest height: Equal bilaterally Lumbar lateral shift: Negative  AROM AROM (Normal range in degrees) AROM   Lumbar   Flexion (65)   Extension (30)   Right lateral flexion (25)   Left lateral flexion (25)   Right rotation (30)   Left rotation (30)       Hip Right Left  Flexion (125)    Extension (15)    Abduction (40)    Adduction     Internal Rotation (45)    External Rotation  (45)        Knee    Flexion (135)    Extension (0)        Ankle    Dorsiflexion (20)    Plantarflexion (50)    Inversion (35)    Eversion (15)    (* = pain; Blank rows = not tested)  LE MMT: MMT (out of 5) Right  Left   Hip flexion    Hip extension    Hip abduction    Hip adduction    Hip internal rotation    Hip external rotation    Knee flexion    Knee extension    Ankle dorsiflexion    Ankle plantarflexion    Ankle inversion    Ankle eversion    (* =  pain; Blank rows = not tested)  Sensation Grossly intact to light touch throughout bilateral LEs as determined by testing dermatomes L2-S2. Proprioception, stereognosis, and hot/cold testing deferred on this date.  Reflexes R/L Knee Jerk (L3/4): 2+/2+  Ankle Jerk (S1/2): 2+/2+   Muscle Length Hamstrings: R: {NEGATIVE/POSITIVE QNM:80001} L: {NEGATIVE/POSITIVE QNM:80001} Ely (quadriceps): R: {NEGATIVE/POSITIVE QNM:80001} L: {NEGATIVE/POSITIVE QNM:80001} Thomas (hip flexors): R: {NEGATIVE/POSITIVE QNM:80001} L: {NEGATIVE/POSITIVE QNM:80001} Ober: R: {NEGATIVE/POSITIVE QNM:80001} L: {NEGATIVE/POSITIVE QNM:80001}  Palpation Location Right Left         Lumbar paraspinals    Quadratus Lumborum    Gluteus Maximus    Gluteus Medius    Deep hip external rotators    PSIS    Fortin's Area (SIJ)    Greater Trochanter    (Blank rows = not tested) Graded on 0-4 scale (0 = no pain, 1 = pain, 2 = pain with wincing/grimacing/flinching, 3 = pain with withdrawal, 4 = unwilling to allow palpation)  Passive Accessory  Motion Pt denies reproduction of back pain with CPA L1-L5 and UPA bilaterally L1-L5. Generally, hypomobile throughout  Special Tests Lumbar Radiculopathy and Discogenic: Centralization and Peripheralization (SN 92, -LR 0.12): {NEGATIVE/POSITIVE FOR:19998} Slump (SN 83, -LR 0.32): R: {NEGATIVE/POSITIVE FOR:19998} L: {NEGATIVE/POSITIVE FOR:19998} SLR (SN 92, -LR 0.29): R: {NEGATIVE/POSITIVE QNM:80001} L:   {NEGATIVE/POSITIVE QNM:80001} Crossed SLR (SP 90): R: {NEGATIVE/POSITIVE QNM:80001} L: {NEGATIVE/POSITIVE QNM:80001}  Facet Joint: Extension-Rotation (SN 100, -LR 0.0): R: {NEGATIVE/POSITIVE QNM:80001} L: {NEGATIVE/POSITIVE QNM:80001}  Lumbar Foraminal Stenosis: Lumbar quadrant (SN 70): R: {NEGATIVE/POSITIVE QNM:80001} L: {NEGATIVE/POSITIVE QNM:80001}  Hip: FABER (SN 81): R: {NEGATIVE/POSITIVE QNM:80001} L: {NEGATIVE/POSITIVE QNM:80001} FADIR (SN 94): R: {NEGATIVE/POSITIVE FOR:19998} L: {NEGATIVE/POSITIVE QNM:80001} Hip scour (SN 50): R: {NEGATIVE/POSITIVE QNM:80001} L: {NEGATIVE/POSITIVE QNM:80001}  SIJ:  Thigh Thrust (SN 88, -LR 0.18) : R: {NEGATIVE/POSITIVE QNM:80001} L: {NEGATIVE/POSITIVE QNM:80001}  Piriformis Syndrome: FAIR Test (SN 88, SP 83): R: {NEGATIVE/POSITIVE QNM:80001} L: {NEGATIVE/POSITIVE QNM:80001}  Functional Tasks Lifting: Deep squat: Sit to stand: Forward Step-Down Test: R:  L:  Lateral Step-Down Test: R:  L:     TODAY'S TREATMENT: DATE: 01/02/2024  Therapeutic Exercise:  Reviewed HEP with patient return demonstration:        PATIENT EDUCATION:  Education details: HEP, POC, Exercise Technique  Person educated: Patient Education method: Explanation, Demonstration, and Handouts Education comprehension: verbalized understanding, returned demonstration, and needs further education   HOME EXERCISE PROGRAM:     ASSESSMENT:  CLINICAL IMPRESSION: Patient is a 68 y.o. female who was seen today for physical therapy evaluation and treatment for lower back pain.   OBJECTIVE IMPAIRMENTS: {opptimpairments:25111}.   ACTIVITY LIMITATIONS: {activitylimitations:27494}  PARTICIPATION LIMITATIONS: {participationrestrictions:25113}  PERSONAL FACTORS: {Personal factors:25162} are also affecting patient's functional outcome.   REHAB POTENTIAL: {rehabpotential:25112}  CLINICAL DECISION MAKING: {clinical decision making:25114}  EVALUATION COMPLEXITY:  {Evaluation complexity:25115}   GOALS: Goals reviewed with patient? {yes/no:20286}  SHORT TERM GOALS: Target date: {follow up:25551}  Pt will be independent with HEP in order to improve strength and decrease back pain to improve pain-free function at home and work. Baseline: *** Goal status: INITIAL   LONG TERM GOALS: Target date: {follow up:25551}  Pt will decrease 5TSTS by at least 3 seconds in order to demonstrate clinically significant improvement in LE strength Baseline:  Goal status: INITIAL  2.  Pt will decrease worst back pain by at least 2 points on the NPRS in order to demonstrate clinically significant reduction in back pain. Baseline: *** Goal status: INITIAL  3.  Pt will decrease mODI score by at least 13  points in order demonstrate clinically significant reduction in back pain/disability.       Baseline: *** Goal status: INITIAL  4.  Pt will perform kettlebell squat with good technique and minimal to no back pain in order to demonstrate proper lifting at home. Baseline: *** Goal status: INITIAL   PLAN:  PT FREQUENCY: {rehab frequency:25116}  PT DURATION: {rehab duration:25117}  PLANNED INTERVENTIONS: [id number]- PT Re-evaluation, 97750- Physical Performance Testing, 97110-Therapeutic exercises, 97530- Therapeutic activity, [id number]- Neuromuscular re-education, 97535- Self Care, [id number]- Manual therapy, Patient/Family education, Balance training, and Moist heat  PLAN FOR NEXT SESSION: Review HEP, Flexion based core strengthening, LE strengthening (gluteal)     Lonni Pall PT, DPT Physical Therapist- [place]  01/02/2024, 8:58 AM  "

## 2024-01-03 NOTE — Therapy (Signed)
 Started Visit for evaluation. Patient cancelled due to insurance co pay. Encounter closed.   Lonni Pall PT, DPT Physical Therapist- [place]

## 2024-01-04 ENCOUNTER — Ambulatory Visit

## 2024-01-12 ENCOUNTER — Ambulatory Visit

## 2024-01-16 ENCOUNTER — Ambulatory Visit

## 2024-01-18 ENCOUNTER — Ambulatory Visit

## 2024-01-23 ENCOUNTER — Ambulatory Visit

## 2024-01-26 ENCOUNTER — Ambulatory Visit

## 2024-01-30 ENCOUNTER — Ambulatory Visit

## 2024-02-02 ENCOUNTER — Ambulatory Visit

## 2024-02-06 ENCOUNTER — Ambulatory Visit

## 2024-02-08 ENCOUNTER — Ambulatory Visit

## 2024-02-13 ENCOUNTER — Ambulatory Visit

## 2024-02-15 ENCOUNTER — Ambulatory Visit

## 2024-10-15 ENCOUNTER — Ambulatory Visit
